# Patient Record
Sex: Female | Born: 1960 | Race: White | Hispanic: No | Marital: Married | Smoking: Never smoker
Health system: Southern US, Community
[De-identification: ages and names within clinical notes are randomized; demographics above are authoritative.]

## PROBLEM LIST (undated history)

## (undated) DIAGNOSIS — F32A Depression, unspecified: Secondary | ICD-10-CM

## (undated) DIAGNOSIS — D649 Anemia, unspecified: Secondary | ICD-10-CM

## (undated) DIAGNOSIS — K2970 Gastritis, unspecified, without bleeding: Secondary | ICD-10-CM

## (undated) DIAGNOSIS — F329 Major depressive disorder, single episode, unspecified: Secondary | ICD-10-CM

## (undated) DIAGNOSIS — N6019 Diffuse cystic mastopathy of unspecified breast: Secondary | ICD-10-CM

## (undated) DIAGNOSIS — F909 Attention-deficit hyperactivity disorder, unspecified type: Secondary | ICD-10-CM

## (undated) DIAGNOSIS — G4733 Obstructive sleep apnea (adult) (pediatric): Secondary | ICD-10-CM

## (undated) HISTORY — DX: Diffuse cystic mastopathy of unspecified breast: N60.19

## (undated) HISTORY — DX: Obstructive sleep apnea (adult) (pediatric): G47.33

## (undated) HISTORY — DX: Attention-deficit hyperactivity disorder, unspecified type: F90.9

## (undated) HISTORY — DX: Gastritis, unspecified, without bleeding: K29.70

## (undated) HISTORY — DX: Depression, unspecified: F32.A

## (undated) HISTORY — PX: GYNECOLOGIC CRYOSURGERY: SHX857

## (undated) HISTORY — DX: Major depressive disorder, single episode, unspecified: F32.9

## (undated) HISTORY — DX: Anemia, unspecified: D64.9

---

## 1992-03-10 HISTORY — PX: NASAL SEPTUM SURGERY: SHX37

## 1999-04-16 ENCOUNTER — Other Ambulatory Visit: Admission: RE | Admit: 1999-04-16 | Discharge: 1999-04-16 | Payer: Self-pay | Admitting: Family Medicine

## 1999-04-30 ENCOUNTER — Encounter: Payer: Self-pay | Admitting: Family Medicine

## 1999-04-30 ENCOUNTER — Ambulatory Visit (HOSPITAL_COMMUNITY): Admission: RE | Admit: 1999-04-30 | Discharge: 1999-04-30 | Payer: Self-pay | Admitting: Family Medicine

## 1999-05-03 ENCOUNTER — Ambulatory Visit (HOSPITAL_COMMUNITY): Admission: RE | Admit: 1999-05-03 | Discharge: 1999-05-03 | Payer: Self-pay | Admitting: *Deleted

## 1999-05-03 ENCOUNTER — Encounter: Payer: Self-pay | Admitting: Family Medicine

## 2000-10-13 ENCOUNTER — Ambulatory Visit (HOSPITAL_COMMUNITY): Admission: RE | Admit: 2000-10-13 | Discharge: 2000-10-13 | Payer: Self-pay | Admitting: Family Medicine

## 2000-10-13 ENCOUNTER — Encounter: Payer: Self-pay | Admitting: Family Medicine

## 2001-10-15 ENCOUNTER — Encounter: Payer: Self-pay | Admitting: Family Medicine

## 2001-10-15 ENCOUNTER — Ambulatory Visit (HOSPITAL_COMMUNITY): Admission: RE | Admit: 2001-10-15 | Discharge: 2001-10-15 | Payer: Self-pay | Admitting: Family Medicine

## 2002-10-25 ENCOUNTER — Ambulatory Visit (HOSPITAL_COMMUNITY): Admission: RE | Admit: 2002-10-25 | Discharge: 2002-10-25 | Payer: Self-pay | Admitting: Family Medicine

## 2002-10-25 ENCOUNTER — Encounter: Payer: Self-pay | Admitting: Family Medicine

## 2004-10-04 ENCOUNTER — Ambulatory Visit (HOSPITAL_COMMUNITY): Admission: RE | Admit: 2004-10-04 | Discharge: 2004-10-04 | Payer: Self-pay | Admitting: Family Medicine

## 2005-10-27 ENCOUNTER — Ambulatory Visit (HOSPITAL_COMMUNITY): Admission: RE | Admit: 2005-10-27 | Discharge: 2005-10-27 | Payer: Self-pay | Admitting: Family Medicine

## 2005-11-04 ENCOUNTER — Encounter: Admission: RE | Admit: 2005-11-04 | Discharge: 2005-11-04 | Payer: Self-pay | Admitting: Family Medicine

## 2006-11-05 ENCOUNTER — Ambulatory Visit (HOSPITAL_COMMUNITY): Admission: RE | Admit: 2006-11-05 | Discharge: 2006-11-05 | Payer: Self-pay | Admitting: Family Medicine

## 2007-11-08 ENCOUNTER — Ambulatory Visit (HOSPITAL_COMMUNITY): Admission: RE | Admit: 2007-11-08 | Discharge: 2007-11-08 | Payer: Self-pay | Admitting: Family Medicine

## 2008-04-26 ENCOUNTER — Encounter: Admission: RE | Admit: 2008-04-26 | Discharge: 2008-04-26 | Payer: Self-pay | Admitting: Internal Medicine

## 2008-11-10 ENCOUNTER — Encounter: Admission: RE | Admit: 2008-11-10 | Discharge: 2008-11-10 | Payer: Self-pay | Admitting: Internal Medicine

## 2008-11-14 ENCOUNTER — Ambulatory Visit (HOSPITAL_COMMUNITY): Admission: RE | Admit: 2008-11-14 | Discharge: 2008-11-14 | Payer: Self-pay | Admitting: Family Medicine

## 2008-11-16 ENCOUNTER — Ambulatory Visit: Payer: Self-pay | Admitting: Gynecology

## 2008-11-21 ENCOUNTER — Ambulatory Visit: Payer: Self-pay | Admitting: Internal Medicine

## 2008-11-23 LAB — CBC & DIFF AND RETIC
BASO%: 0.8 % (ref 0.0–2.0)
Basophils Absolute: 0.1 10*3/uL (ref 0.0–0.1)
EOS%: 1.5 % (ref 0.0–7.0)
Eosinophils Absolute: 0.1 10*3/uL (ref 0.0–0.5)
HCT: 32.3 % — ABNORMAL LOW (ref 34.8–46.6)
HGB: 9.7 g/dL — ABNORMAL LOW (ref 11.6–15.9)
Immature Retic Fract: 16.3 % — ABNORMAL HIGH (ref 0.00–10.70)
LYMPH%: 30.6 % (ref 14.0–49.7)
MCH: 22.4 pg — ABNORMAL LOW (ref 25.1–34.0)
MCHC: 30 g/dL — ABNORMAL LOW (ref 31.5–36.0)
MCV: 74.4 fL — ABNORMAL LOW (ref 79.5–101.0)
MONO#: 0.4 10*3/uL (ref 0.1–0.9)
MONO%: 6.4 % (ref 0.0–14.0)
NEUT#: 3.6 10*3/uL (ref 1.5–6.5)
NEUT%: 60.7 % (ref 38.4–76.8)
Platelets: 334 10*3/uL (ref 145–400)
RBC: 4.34 10*6/uL (ref 3.70–5.45)
RDW: 15.1 % — ABNORMAL HIGH (ref 11.2–14.5)
Retic %: 0.97 % (ref 0.50–1.50)
Retic Ct Abs: 42.1 10*3/uL (ref 18.30–72.70)
WBC: 5.9 10*3/uL (ref 3.9–10.3)
lymph#: 1.8 10*3/uL (ref 0.9–3.3)

## 2008-11-27 LAB — PROTEIN ELECTROPHORESIS, SERUM
Albumin ELP: 57 % (ref 55.8–66.1)
Alpha-1-Globulin: 3.9 % (ref 2.9–4.9)
Alpha-2-Globulin: 10.1 % (ref 7.1–11.8)
Beta 2: 6 % (ref 3.2–6.5)
Beta Globulin: 7.8 % — ABNORMAL HIGH (ref 4.7–7.2)
Gamma Globulin: 15.2 % (ref 11.1–18.8)
Total Protein, Serum Electrophoresis: 7.3 g/dL (ref 6.0–8.3)

## 2008-11-27 LAB — LACTATE DEHYDROGENASE: LDH: 145 U/L (ref 94–250)

## 2008-11-27 LAB — COMPREHENSIVE METABOLIC PANEL
ALT: 12 U/L (ref 0–35)
AST: 16 U/L (ref 0–37)
Albumin: 4.3 g/dL (ref 3.5–5.2)
Alkaline Phosphatase: 72 U/L (ref 39–117)
BUN: 8 mg/dL (ref 6–23)
CO2: 25 mEq/L (ref 19–32)
Calcium: 9.1 mg/dL (ref 8.4–10.5)
Chloride: 101 mEq/L (ref 96–112)
Creatinine, Ser: 0.65 mg/dL (ref 0.40–1.20)
Glucose, Bld: 90 mg/dL (ref 70–99)
Potassium: 4 mEq/L (ref 3.5–5.3)
Sodium: 137 mEq/L (ref 135–145)
Total Bilirubin: 0.3 mg/dL (ref 0.3–1.2)
Total Protein: 7.3 g/dL (ref 6.0–8.3)

## 2008-11-27 LAB — FERRITIN: Ferritin: 2 ng/mL — ABNORMAL LOW (ref 10–291)

## 2008-11-27 LAB — IRON AND TIBC
%SAT: 4 % — ABNORMAL LOW (ref 20–55)
Iron: 18 ug/dL — ABNORMAL LOW (ref 42–145)
TIBC: 403 ug/dL (ref 250–470)
UIBC: 385 ug/dL

## 2008-11-27 LAB — VITAMIN B12: Vitamin B-12: 725 pg/mL (ref 211–911)

## 2008-11-27 LAB — ERYTHROPOIETIN: Erythropoietin: 76.9 m[IU]/mL — ABNORMAL HIGH (ref 2.6–34.0)

## 2008-11-27 LAB — FOLATE: Folate: >20 ng/mL

## 2008-12-04 ENCOUNTER — Ambulatory Visit: Payer: Self-pay | Admitting: Gynecology

## 2008-12-07 ENCOUNTER — Ambulatory Visit: Payer: Self-pay | Admitting: Gynecology

## 2008-12-08 ENCOUNTER — Ambulatory Visit (HOSPITAL_BASED_OUTPATIENT_CLINIC_OR_DEPARTMENT_OTHER): Admission: RE | Admit: 2008-12-08 | Discharge: 2008-12-08 | Payer: Self-pay | Admitting: Gynecology

## 2008-12-08 ENCOUNTER — Encounter: Payer: Self-pay | Admitting: Gynecology

## 2008-12-09 HISTORY — PX: PELVIC LAPAROSCOPY: SHX162

## 2008-12-18 ENCOUNTER — Ambulatory Visit: Payer: Self-pay | Admitting: Gynecology

## 2008-12-29 ENCOUNTER — Ambulatory Visit: Payer: Self-pay | Admitting: Internal Medicine

## 2008-12-29 LAB — CBC WITH DIFFERENTIAL/PLATELET
BASO%: 0.7 % (ref 0.0–2.0)
Basophils Absolute: 0 10*3/uL (ref 0.0–0.1)
EOS%: 1.5 % (ref 0.0–7.0)
Eosinophils Absolute: 0.1 10*3/uL (ref 0.0–0.5)
HCT: 39.2 % (ref 34.8–46.6)
HGB: 12.2 g/dL (ref 11.6–15.9)
LYMPH%: 23.4 % (ref 14.0–49.7)
MCH: 25.6 pg (ref 25.1–34.0)
MCHC: 31.1 g/dL — ABNORMAL LOW (ref 31.5–36.0)
MCV: 82.2 fL (ref 79.5–101.0)
MONO#: 0.4 10*3/uL (ref 0.1–0.9)
MONO%: 6.1 % (ref 0.0–14.0)
NEUT#: 4.2 10*3/uL (ref 1.5–6.5)
NEUT%: 68.3 % (ref 38.4–76.8)
Platelets: 254 10*3/uL (ref 145–400)
RBC: 4.77 10*6/uL (ref 3.70–5.45)
RDW: 20.2 % — ABNORMAL HIGH (ref 11.2–14.5)
WBC: 6.1 10*3/uL (ref 3.9–10.3)
lymph#: 1.4 10*3/uL (ref 0.9–3.3)

## 2008-12-29 LAB — IRON AND TIBC
%SAT: 31 % (ref 20–55)
Iron: 82 ug/dL (ref 42–145)
TIBC: 266 ug/dL (ref 250–470)
UIBC: 184 ug/dL

## 2008-12-29 LAB — FERRITIN: Ferritin: 272 ng/mL (ref 10–291)

## 2009-03-28 ENCOUNTER — Ambulatory Visit (HOSPITAL_COMMUNITY): Admission: RE | Admit: 2009-03-28 | Discharge: 2009-03-28 | Payer: Self-pay | Admitting: Gastroenterology

## 2009-04-16 ENCOUNTER — Ambulatory Visit: Payer: Self-pay | Admitting: Internal Medicine

## 2009-04-18 LAB — CBC WITH DIFFERENTIAL/PLATELET
BASO%: 0.4 % (ref 0.0–2.0)
Basophils Absolute: 0 10*3/uL (ref 0.0–0.1)
EOS%: 1.9 % (ref 0.0–7.0)
Eosinophils Absolute: 0.2 10*3/uL (ref 0.0–0.5)
HCT: 38.4 % (ref 34.8–46.6)
HGB: 13 g/dL (ref 11.6–15.9)
LYMPH%: 23.3 % (ref 14.0–49.7)
MCH: 30.8 pg (ref 25.1–34.0)
MCHC: 33.8 g/dL (ref 31.5–36.0)
MCV: 91 fL (ref 79.5–101.0)
MONO#: 0.5 10*3/uL (ref 0.1–0.9)
MONO%: 5.5 % (ref 0.0–14.0)
NEUT#: 5.7 10*3/uL (ref 1.5–6.5)
NEUT%: 68.9 % (ref 38.4–76.8)
Platelets: 278 10*3/uL (ref 145–400)
RBC: 4.22 10*6/uL (ref 3.70–5.45)
RDW: 12.9 % (ref 11.2–14.5)
WBC: 8.3 10*3/uL (ref 3.9–10.3)
lymph#: 1.9 10*3/uL (ref 0.9–3.3)

## 2009-04-18 LAB — IRON AND TIBC
%SAT: 26 % (ref 20–55)
Iron: 68 ug/dL (ref 42–145)
TIBC: 258 ug/dL (ref 250–470)
UIBC: 190 ug/dL

## 2009-04-18 LAB — FERRITIN: Ferritin: 135 ng/mL (ref 10–291)

## 2010-02-14 ENCOUNTER — Ambulatory Visit: Payer: Self-pay | Admitting: Gynecology

## 2010-02-14 ENCOUNTER — Other Ambulatory Visit
Admission: RE | Admit: 2010-02-14 | Discharge: 2010-02-14 | Payer: Self-pay | Source: Home / Self Care | Admitting: Gynecology

## 2010-02-20 ENCOUNTER — Ambulatory Visit: Payer: Self-pay | Admitting: Gynecology

## 2010-03-08 ENCOUNTER — Ambulatory Visit: Payer: Self-pay | Admitting: Gynecology

## 2010-03-10 HISTORY — PX: BREAST BIOPSY: SHX20

## 2010-03-28 ENCOUNTER — Ambulatory Visit (HOSPITAL_COMMUNITY)
Admission: RE | Admit: 2010-03-28 | Discharge: 2010-03-28 | Payer: Self-pay | Source: Home / Self Care | Attending: Gynecology | Admitting: Gynecology

## 2010-03-30 ENCOUNTER — Encounter: Payer: Self-pay | Admitting: Family Medicine

## 2010-03-31 ENCOUNTER — Encounter: Payer: Self-pay | Admitting: Family Medicine

## 2010-04-04 ENCOUNTER — Encounter
Admission: RE | Admit: 2010-04-04 | Discharge: 2010-04-04 | Payer: Self-pay | Source: Home / Self Care | Attending: Gynecology | Admitting: Gynecology

## 2010-04-08 ENCOUNTER — Other Ambulatory Visit: Payer: Self-pay | Admitting: Diagnostic Radiology

## 2010-04-08 ENCOUNTER — Encounter
Admission: RE | Admit: 2010-04-08 | Discharge: 2010-04-08 | Payer: Self-pay | Source: Home / Self Care | Attending: Gynecology | Admitting: Gynecology

## 2011-05-19 ENCOUNTER — Other Ambulatory Visit: Payer: Self-pay | Admitting: Gynecology

## 2011-05-19 DIAGNOSIS — Z1231 Encounter for screening mammogram for malignant neoplasm of breast: Secondary | ICD-10-CM

## 2011-05-28 ENCOUNTER — Encounter: Payer: Self-pay | Admitting: Gynecology

## 2011-05-28 ENCOUNTER — Ambulatory Visit (INDEPENDENT_AMBULATORY_CARE_PROVIDER_SITE_OTHER): Payer: BC Managed Care – PPO | Admitting: Gynecology

## 2011-05-28 VITALS — BP 138/92 | Ht 61.5 in | Wt 155.0 lb

## 2011-05-28 DIAGNOSIS — I10 Essential (primary) hypertension: Secondary | ICD-10-CM | POA: Insufficient documentation

## 2011-05-28 DIAGNOSIS — N898 Other specified noninflammatory disorders of vagina: Secondary | ICD-10-CM

## 2011-05-28 DIAGNOSIS — N920 Excessive and frequent menstruation with regular cycle: Secondary | ICD-10-CM

## 2011-05-28 DIAGNOSIS — R635 Abnormal weight gain: Secondary | ICD-10-CM

## 2011-05-28 DIAGNOSIS — L293 Anogenital pruritus, unspecified: Secondary | ICD-10-CM

## 2011-05-28 DIAGNOSIS — G43909 Migraine, unspecified, not intractable, without status migrainosus: Secondary | ICD-10-CM

## 2011-05-28 DIAGNOSIS — Z01419 Encounter for gynecological examination (general) (routine) without abnormal findings: Secondary | ICD-10-CM

## 2011-05-28 DIAGNOSIS — R102 Pelvic and perineal pain: Secondary | ICD-10-CM

## 2011-05-28 LAB — CBC WITH DIFFERENTIAL/PLATELET
Basophils Absolute: 0 10*3/uL (ref 0.0–0.1)
Basophils Relative: 1 % (ref 0–1)
Eosinophils Absolute: 0.1 10*3/uL (ref 0.0–0.7)
Eosinophils Relative: 1 % (ref 0–5)
HCT: 44.5 % (ref 36.0–46.0)
Hemoglobin: 14.1 g/dL (ref 12.0–15.0)
Lymphocytes Relative: 25 % (ref 12–46)
Lymphs Abs: 1.9 10*3/uL (ref 0.7–4.0)
MCH: 28.8 pg (ref 26.0–34.0)
MCHC: 31.7 g/dL (ref 30.0–36.0)
MCV: 91 fL (ref 78.0–100.0)
Monocytes Absolute: 0.5 10*3/uL (ref 0.1–1.0)
Monocytes Relative: 7 % (ref 3–12)
Neutro Abs: 4.9 10*3/uL (ref 1.7–7.7)
Neutrophils Relative %: 66 % (ref 43–77)
Platelets: 277 10*3/uL (ref 150–400)
RBC: 4.89 MIL/uL (ref 3.87–5.11)
RDW: 14.2 % (ref 11.5–15.5)
WBC: 7.4 10*3/uL (ref 4.0–10.5)

## 2011-05-28 LAB — COMPREHENSIVE METABOLIC PANEL
ALT: 23 U/L (ref 0–35)
AST: 19 U/L (ref 0–37)
Albumin: 4.4 g/dL (ref 3.5–5.2)
Alkaline Phosphatase: 75 U/L (ref 39–117)
BUN: 15 mg/dL (ref 6–23)
CO2: 26 mEq/L (ref 19–32)
Calcium: 9.4 mg/dL (ref 8.4–10.5)
Chloride: 101 mEq/L (ref 96–112)
Creat: 0.65 mg/dL (ref 0.50–1.10)
Glucose, Bld: 87 mg/dL (ref 70–99)
Potassium: 4.1 mEq/L (ref 3.5–5.3)
Sodium: 137 mEq/L (ref 135–145)
Total Bilirubin: 0.7 mg/dL (ref 0.3–1.2)
Total Protein: 7.2 g/dL (ref 6.0–8.3)

## 2011-05-28 LAB — WET PREP FOR TRICH, YEAST, CLUE
Clue Cells Wet Prep HPF POC: NONE SEEN
Trich, Wet Prep: NONE SEEN
Yeast Wet Prep HPF POC: NONE SEEN

## 2011-05-28 LAB — CHOLESTEROL, TOTAL: Cholesterol: 196 mg/dL (ref 0–200)

## 2011-05-28 LAB — TSH: TSH: 2.415 u[IU]/mL (ref 0.350–4.500)

## 2011-05-28 MED ORDER — ESTRADIOL 0.0375 MG/24HR TD PTTW: MEDICATED_PATCH | TRANSDERMAL | Status: DC

## 2011-05-28 MED ORDER — TRANEXAMIC ACID 650 MG PO TABS: 1300.0000 mg | ORAL_TABLET | Freq: Three times a day (TID) | ORAL | Status: DC

## 2011-05-28 MED ORDER — TERCONAZOLE 0.4 % VA CREA: 1.0000 | TOPICAL_CREAM | Freq: Every day | VAGINAL | Status: AC

## 2011-05-28 NOTE — Progress Notes (Signed)
Kristin Whitney 1960-08-14 [id number]   History:    51 y.o.  for annual exam gravida 2 para 2 complaints of left lower quadrant discomfort when she is laying on that side. Also complaining of vulvar pruritus but no discharge. Her husband has had a vasectomy. Patient also has had episodes of menorrhagia and wanted something to help her down the amount of bleeding. Mammogram was in 2012 a left breast biopsy turned out to be fibrocystic changes. Patient suffers from migraines 2-3 days before her periods.  Past medical history,surgical history, family history and social history were all reviewed and documented in the EPIC chart.  Gynecologic History Patient's last menstrual period was 05/04/2011. Contraception: vasectomy Last Pap: 2011. Results were:normal} Last mammogram: 2012. Results were: Left breast fibrocystic changes on biopsy  Obstetric History OB History    Grav Para Term Preterm Abortions TAB SAB Ect Mult Living   2 2 1       2      # Outc Date GA Lbr Len/2nd Wgt Sex Del Anes PTL Lv   1 TRM     M SVD  No Yes   2 PAR     F SVD  No Yes       ROS:  Was performed and pertinent positives and negatives are included in the history.  Exam: chaperone present  BP 138/92  Ht 5' 1.5" (1.562 m)  Wt 155 lb (70.308 kg)  BMI 28.81 kg/m2  LMP 05/04/2011  Body mass index is 28.81 kg/(m^2).  General appearance : Well developed well nourished female. No acute distress HEENT: Neck supple, trachea midline, no carotid bruits, no thyroidmegaly Lungs: Clear to auscultation, no rhonchi or wheezes, or rib retractions  Heart: Regular rate and rhythm, no murmurs or gallops Breast:Examined in sitting and supine position were symmetrical in appearance, no palpable masses or tenderness,  no skin retraction, no nipple inversion, no nipple discharge, no skin discoloration, no axillary or supraclavicular lymphadenopathy Abdomen: no palpable masses or tenderness, no rebound or guarding Extremities: no  edema or skin discoloration or tenderness  Pelvic:  Bartholin, Urethra, Skene Glands: Within normal limits             Vagina: No gross lesions or discharge  Cervix: No gross lesions or discharge  Uterus  and retroverted, normal size, shape and consistency, non-tender and mobile  Adnexa  tender on left adnexa left ovary not able to be palpated.  Anus and perineum  normal   Rectovaginal  normal sphincter tone without palpated masses or tenderness             Hemoccult not done     Assessment/Plan:  51 y.o. female for annual exam will return back later in the week for an ultrasound to better assess her left adnexa due to the fact that she sometimes complains of tenderness especially when she lays on her left side. Wet prep was essentially negative this is mostly external we'll give her a trial of Terazol 7 to apply before bedtime for one week. The following labs will be drawn today CBC, cholesterol, competence metabolic panel and urinalysis. We'll recheck her blood pressure before she leaves today. Blood pressure was 130/92. Patient to schedule her mammogram as well. Her menstrual migraine we'll put her on Vivelle-Dot 0.0375 to apply 3-4 days before her menses. The risks benefits and pros and cons were discussed. She is instructed to do her monthly self breast examination. New Pap smear screening guidelines discussed last Pap in 2011 her  next Pap smear in 2014.  Ok Edwards MD, 10:43 AM 05/28/2011

## 2011-05-29 LAB — URINALYSIS W MICROSCOPIC + REFLEX CULTURE
Bacteria, UA: NONE SEEN
Bilirubin Urine: NEGATIVE
Casts: NONE SEEN
Crystals: NONE SEEN
Glucose, UA: NEGATIVE mg/dL
Hgb urine dipstick: NEGATIVE
Ketones, ur: NEGATIVE mg/dL
Leukocytes, UA: NEGATIVE
Nitrite: NEGATIVE
Protein, ur: NEGATIVE mg/dL
Specific Gravity, Urine: 1.02 (ref 1.005–1.030)
Squamous Epithelial / LPF: NONE SEEN
Urobilinogen, UA: 0.2 mg/dL (ref 0.0–1.0)
pH: 5 (ref 5.0–8.0)

## 2011-06-02 ENCOUNTER — Ambulatory Visit (INDEPENDENT_AMBULATORY_CARE_PROVIDER_SITE_OTHER): Payer: BC Managed Care – PPO | Admitting: Gynecology

## 2011-06-02 ENCOUNTER — Ambulatory Visit (INDEPENDENT_AMBULATORY_CARE_PROVIDER_SITE_OTHER): Payer: BC Managed Care – PPO

## 2011-06-02 ENCOUNTER — Encounter: Payer: Self-pay | Admitting: Gynecology

## 2011-06-02 VITALS — BP 128/80

## 2011-06-02 DIAGNOSIS — R102 Pelvic and perineal pain: Secondary | ICD-10-CM

## 2011-06-02 DIAGNOSIS — N949 Unspecified condition associated with female genital organs and menstrual cycle: Secondary | ICD-10-CM

## 2011-06-02 DIAGNOSIS — Z8742 Personal history of other diseases of the female genital tract: Secondary | ICD-10-CM

## 2011-06-02 DIAGNOSIS — D259 Leiomyoma of uterus, unspecified: Secondary | ICD-10-CM

## 2011-06-02 DIAGNOSIS — G43829 Menstrual migraine, not intractable, without status migrainosus: Secondary | ICD-10-CM | POA: Insufficient documentation

## 2011-06-02 DIAGNOSIS — N831 Corpus luteum cyst of ovary, unspecified side: Secondary | ICD-10-CM

## 2011-06-02 NOTE — Progress Notes (Signed)
Patient presented to the office today to discuss the results of the ultrasound that was ordered at the time of her annual exam at last office visit as a result of patient's left lower abdominal discomfort. Patient with prior history of left ovarian cystectomy and history of endometrial ablation. The discomfort is nonspecific and some time of this occurs only when she is laying on her side. It is not affecting her quality of life. She was also having menstrual migraines and recently had been started Vivelle-Dot 0.0375 which she applies transdermally to 4 days before her menses. She is to before the cycle and did not suffer from the menstrual migraines.  All her recent lab work was normal to include CBC, cholesterol, comprehensive metabolic panel and urinalysis. Her blood pressure today was 128/80.  Ultrasound: Uterus measured 9.9 x 5.5 x 4.8 cm with an endometrial stripe is 7 mm patient had 1 small intramural myoma measuring 11 x 8 mm both ovaries otherwise normal and no adnexal masses were seen.  Etiology for occasional left lower abdominal discomfort while lying down may be attributed to adhesions. Since is not affecting her quality of life we'll continue to monitor. She's going to work on exercising losing weight. If her symptoms worsen she'll contact the office and proceed with doing an abdominal pelvic CT scan is we had discussed in the office.

## 2011-06-10 ENCOUNTER — Encounter: Payer: Self-pay | Admitting: Gynecology

## 2011-06-12 ENCOUNTER — Ambulatory Visit (HOSPITAL_COMMUNITY): Payer: Self-pay

## 2011-06-18 ENCOUNTER — Ambulatory Visit (HOSPITAL_COMMUNITY): Payer: BC Managed Care – PPO

## 2011-06-24 ENCOUNTER — Ambulatory Visit (HOSPITAL_COMMUNITY)
Admission: RE | Admit: 2011-06-24 | Discharge: 2011-06-24 | Disposition: A | Discharge status: Home / Self Care | Payer: BC Managed Care – PPO | Source: Ambulatory Visit | Attending: Gynecology | Admitting: Gynecology

## 2011-06-24 DIAGNOSIS — Z1231 Encounter for screening mammogram for malignant neoplasm of breast: Secondary | ICD-10-CM

## 2012-05-12 ENCOUNTER — Other Ambulatory Visit: Payer: Self-pay | Admitting: Gynecology

## 2012-05-12 DIAGNOSIS — Z1231 Encounter for screening mammogram for malignant neoplasm of breast: Secondary | ICD-10-CM

## 2012-06-07 ENCOUNTER — Encounter: Payer: Self-pay | Admitting: Gynecology

## 2012-06-07 ENCOUNTER — Ambulatory Visit (INDEPENDENT_AMBULATORY_CARE_PROVIDER_SITE_OTHER): Payer: BC Managed Care – PPO | Admitting: Gynecology

## 2012-06-07 ENCOUNTER — Other Ambulatory Visit (HOSPITAL_COMMUNITY)
Admission: RE | Admit: 2012-06-07 | Discharge: 2012-06-07 | Disposition: A | Discharge status: Home / Self Care | Payer: BC Managed Care – PPO | Source: Ambulatory Visit | Attending: Gynecology | Admitting: Gynecology

## 2012-06-07 VITALS — BP 130/88 | Ht 61.5 in | Wt 162.0 lb

## 2012-06-07 DIAGNOSIS — Z01419 Encounter for gynecological examination (general) (routine) without abnormal findings: Secondary | ICD-10-CM

## 2012-06-07 DIAGNOSIS — Z23 Encounter for immunization: Secondary | ICD-10-CM

## 2012-06-07 DIAGNOSIS — Z1151 Encounter for screening for human papillomavirus (HPV): Secondary | ICD-10-CM | POA: Insufficient documentation

## 2012-06-07 DIAGNOSIS — N920 Excessive and frequent menstruation with regular cycle: Secondary | ICD-10-CM | POA: Insufficient documentation

## 2012-06-07 DIAGNOSIS — R635 Abnormal weight gain: Secondary | ICD-10-CM

## 2012-06-07 DIAGNOSIS — N930 Postcoital and contact bleeding: Secondary | ICD-10-CM

## 2012-06-07 DIAGNOSIS — N841 Polyp of cervix uteri: Secondary | ICD-10-CM

## 2012-06-07 DIAGNOSIS — Z862 Personal history of diseases of the blood and blood-forming organs and certain disorders involving the immune mechanism: Secondary | ICD-10-CM

## 2012-06-07 DIAGNOSIS — R5383 Other fatigue: Secondary | ICD-10-CM | POA: Insufficient documentation

## 2012-06-07 DIAGNOSIS — R5381 Other malaise: Secondary | ICD-10-CM

## 2012-06-07 LAB — CBC WITH DIFFERENTIAL/PLATELET
Basophils Absolute: 0.1 10*3/uL (ref 0.0–0.1)
Basophils Relative: 1 % (ref 0–1)
Eosinophils Absolute: 0.1 10*3/uL (ref 0.0–0.7)
Eosinophils Relative: 1 % (ref 0–5)
HCT: 41.1 % (ref 36.0–46.0)
Hemoglobin: 13.5 g/dL (ref 12.0–15.0)
Lymphocytes Relative: 25 % (ref 12–46)
Lymphs Abs: 1.6 10*3/uL (ref 0.7–4.0)
MCH: 29.3 pg (ref 26.0–34.0)
MCHC: 32.8 g/dL (ref 30.0–36.0)
MCV: 89.3 fL (ref 78.0–100.0)
Monocytes Absolute: 0.3 10*3/uL (ref 0.1–1.0)
Monocytes Relative: 5 % (ref 3–12)
Neutro Abs: 4.3 10*3/uL (ref 1.7–7.7)
Neutrophils Relative %: 68 % (ref 43–77)
Platelets: 295 10*3/uL (ref 150–400)
RBC: 4.6 MIL/uL (ref 3.87–5.11)
RDW: 13.5 % (ref 11.5–15.5)
WBC: 6.4 10*3/uL (ref 4.0–10.5)

## 2012-06-07 LAB — TSH: TSH: 2.834 u[IU]/mL (ref 0.350–4.500)

## 2012-06-07 LAB — CHOLESTEROL, TOTAL: Cholesterol: 197 mg/dL (ref 0–200)

## 2012-06-07 LAB — HEMOGLOBIN A1C
Hgb A1c MFr Bld: 5.5 % (ref <5.7)
Mean Plasma Glucose: 111 mg/dL (ref <117)

## 2012-06-07 NOTE — Progress Notes (Signed)
Kristin Whitney 05-17-1960 [id number]   History:    52 y.o.  for annual gyn exam whose main complaint today is tiredness and fatigue. Patient also has past history of anemia whereby she had been referred to the hematologist oncologist Dr. Shirline Frees back in 2010 and patient received iron dextran infusion. She had subsequently been started on iron tablet but did not continue to do so. Review of her record also indicated that she has a prior history of endometrial ablation back in 2010 as well as resectoscopic polypectomy at the same time. During that operation she also had a left ovarian cystectomy for a benign paratubal cyst that was causing her pain and she had tubo-ovarian adhesions. Patient states her cycles are lasting approximately 7 days and are heavy. Patient also at times has had some postcoital bleeding.Patient does her monthly self breast examination her last mammogram was in 2013. Review of her record indicated that she had a left breast biopsy 2012 and only fibrocystic changes were noted. Patient also has informed me that greater than 10 years ago she had cryotherapy of her cervix but subsequent Pap smears have been normal. Her last normal Pap smear her office was in 2011. Patient's last colonoscopy was negative back in 2011. Her husband has had a vasectomy. Patient continued to gaining weight. Last year she was weighing 155 and is up to 162 now.  Past medical history,surgical history, family history and social history were all reviewed and documented in the EPIC chart.  Gynecologic History Patient's last menstrual period was 05/19/2012. Contraception: vasectomy Last Pap: 2011. Results were: normal Last mammogram: 2013. Results were: normal  Obstetric History OB History   Grav Para Term Preterm Abortions TAB SAB Ect Mult Living   2 2 1       2      # Outc Date GA Lbr Len/2nd Wgt Sex Del Anes PTL Lv   1 TRM     M SVD  No Yes   2 PAR     F SVD  No Yes       ROS: A ROS was performed and  pertinent positives and negatives are included in the history.  GENERAL: No fevers or chills. HEENT: No change in vision, no earache, sore throat or sinus congestion. NECK: No pain or stiffness. CARDIOVASCULAR: No chest pain or pressure. No palpitations. PULMONARY: No shortness of breath, cough or wheeze. GASTROINTESTINAL: No abdominal pain, nausea, vomiting or diarrhea, melena or bright red blood per rectum. GENITOURINARY: No urinary frequency, urgency, hesitancy or dysuria. MUSCULOSKELETAL: No joint or muscle pain, no back pain, no recent trauma. DERMATOLOGIC: No rash, no itching, no lesions. ENDOCRINE: No polyuria, polydipsia, no heat or cold intolerance. No recent change in weight. HEMATOLOGICAL: No anemia or easy bruising or bleeding. NEUROLOGIC: No headache, seizures, numbness, tingling or weakness. PSYCHIATRIC: No depression, no loss of interest in normal activity or change in sleep pattern.     Exam: chaperone present  BP 130/88  Ht 5' 1.5" (1.562 m)  Wt 162 lb (73.483 kg)  BMI 30.12 kg/m2  LMP 05/19/2012  Body mass index is 30.12 kg/(m^2).  General appearance : Well developed well nourished female. No acute distress HEENT: Neck supple, trachea midline, no carotid bruits, no thyroidmegaly Lungs: Clear to auscultation, no rhonchi or wheezes, or rib retractions  Heart: Regular rate and rhythm, no murmurs or gallops Breast:Examined in sitting and supine position were symmetrical in appearance, no palpable masses or tenderness,  no skin retraction, no nipple inversion, no  nipple discharge, no skin discoloration, no axillary or supraclavicular lymphadenopathy Abdomen: no palpable masses or tenderness, no rebound or guarding Extremities: no edema or skin discoloration or tenderness  Pelvic:  Bartholin, Urethra, Skene Glands: Within normal limits             Vagina: No gross lesions or discharge  Cervix: No gross lesions or discharge, cervical polyp  Uterus  anteverted, normal size, shape  and consistency, non-tender and mobile  Adnexa  Without masses or tenderness  Anus and perineum  normal   Rectovaginal  normal sphincter tone without palpated masses or tenderness             Hemoccult antevertednot indicated     Assessment/Plan:  52 y.o. female for annual exam who today incidentally was found to have a cervical polyp. The area was cleansed with Betadine solution. The cervical polyp was grasped with a ring forcep and twisted off its pedicle and submitted for histological evaluation. Silver nitrate was used for hemostasis. This was done after her Pap smear was done. The new guidelines were discussed. Patient will return back next week for sonohysterogram to see if there is any room in the intrauterine cavity that will allow to place a Mirena IUD to help with her menorrhagia and her anemia. If not we had discussed alternatives to include low dose oral contraceptive pill continuous whereby she would withdraw every 3 months or proceed with a transvaginal hysterectomy with ovarian conservation. Literature information was provided on the above. The following labs were ordered today: CBC, hemoglobin A1c, screening cholesterol, TSH, urinalysis, and vitamin D level.. Patient scheduled for mammogram next month. She is encouraged her monthly self breast examination. We discussed importance of regular exercise and healthy nutrition.    Ok Edwards MD, 9:17 AM 06/07/2012

## 2012-06-07 NOTE — Patient Instructions (Addendum)
Tetanus, Diphtheria, Pertussis (Tdap) Vaccine What You Need to Know WHY GET VACCINATED? Tetanus, diphtheria and pertussis can be very serious diseases, even for adolescents and adults. Tdap vaccine can protect us from these diseases. TETANUS (Lockjaw) causes painful muscle tightening and stiffness, usually all over the body.  It can lead to tightening of muscles in the head and neck so you can't open your mouth, swallow, or sometimes even breathe. Tetanus kills about 1 out of 5 people who are infected. DIPHTHERIA can cause a thick coating to form in the back of the throat.  It can lead to breathing problems, paralysis, heart failure, and death. PERTUSSIS (Whooping Cough) causes severe coughing spells, which can cause difficulty breathing, vomiting and disturbed sleep.  It can also lead to weight loss, incontinence, and rib fractures. Up to 2 in 100 adolescents and 5 in 100 adults with pertussis are hospitalized or have complications, which could include pneumonia and death. These diseases are caused by bacteria. Diphtheria and pertussis are spread from person to person through coughing or sneezing. Tetanus enters the body through cuts, scratches, or wounds. Before vaccines, the [place] saw as many as 200,000 cases a year of diphtheria and pertussis, and hundreds of cases of tetanus. Since vaccination began, tetanus and diphtheria have dropped by about 99% and pertussis by about 80%. TDAP VACCINE Tdap vaccine can protect adolescents and adults from tetanus, diphtheria, and pertussis. One dose of Tdap is routinely given at age 11 or 12. People who did not get Tdap at that age should get it as soon as possible. Tdap is especially important for health care professionals and anyone having close contact with a baby younger than 12 months. Pregnant women should get a dose of Tdap during every pregnancy, to protect the newborn from pertussis. Infants are most at risk for severe, life-threatening  complications from pertussis. A similar vaccine, called Td, protects from tetanus and diphtheria, but not pertussis. A Td booster should be given every 10 years. Tdap may be given as one of these boosters if you have not already gotten a dose. Tdap may also be given after a severe cut or burn to prevent tetanus infection. Your doctor can give you more information. Tdap may safely be given at the same time as other vaccines. SOME PEOPLE SHOULD NOT GET THIS VACCINE  If you ever had a life-threatening allergic reaction after a dose of any tetanus, diphtheria, or pertussis containing vaccine, OR if you have a severe allergy to any part of this vaccine, you should not get Tdap. Tell your doctor if you have any severe allergies.  If you had a coma, or long or multiple seizures within 7 days after a childhood dose of DTP or DTaP, you should not get Tdap, unless a cause other than the vaccine was found. You can still get Td.  Talk to your doctor if you:  have epilepsy or another nervous system problem,  had severe pain or swelling after any vaccine containing diphtheria, tetanus or pertussis,  ever had Guillain-Barr Syndrome (GBS),  aren't feeling well on the day the shot is scheduled. RISKS OF A VACCINE REACTION With any medicine, including vaccines, there is a chance of side effects. These are usually mild and go away on their own, but serious reactions are also possible. Brief fainting spells can follow a vaccination, leading to injuries from falling. Sitting or lying down for about 15 minutes can help prevent these. Tell your doctor if you feel dizzy or light-headed, or   have vision changes or ringing in the ears. Mild problems following Tdap (Did not interfere with activities)  Pain where the shot was given (about 3 in 4 adolescents or 2 in 3 adults)  Redness or swelling where the shot was given (about 1 person in 5)  Mild fever of at least 100.26F (up to about 1 in 25 adolescents or 1 in  100 adults)  Headache (about 3 or 4 people in 10)  Tiredness (about 1 person in 3 or 4)  Nausea, vomiting, diarrhea, stomach ache (up to 1 in 4 adolescents or 1 in 10 adults)  Chills, body aches, sore joints, rash, swollen glands (uncommon) Moderate problems following Tdap (Interfered with activities, but did not require medical attention)  Pain where the shot was given (about 1 in 5 adolescents or 1 in 100 adults)  Redness or swelling where the shot was given (up to about 1 in 16 adolescents or 1 in 25 adults)  Fever over 102F (about 1 in 100 adolescents or 1 in 250 adults)  Headache (about 3 in 20 adolescents or 1 in 10 adults)  Nausea, vomiting, diarrhea, stomach ache (up to 1 or 3 people in 100)  Swelling of the entire arm where the shot was given (up to about 3 in 100). Severe problems following Tdap (Unable to perform usual activities, required medical attention)  Swelling, severe pain, bleeding and redness in the arm where the shot was given (rare). A severe allergic reaction could occur after any vaccine (estimated less than 1 in a million doses). WHAT IF THERE IS A SERIOUS REACTION? What should I look for?  Look for anything that concerns you, such as signs of a severe allergic reaction, very high fever, or behavior changes. Signs of a severe allergic reaction can include hives, swelling of the face and throat, difficulty breathing, a fast heartbeat, dizziness, and weakness. These would start a few minutes to a few hours after the vaccination. What should I do?  If you think it is a severe allergic reaction or other emergency that can't wait, call 9-1-1 or get the person to the nearest hospital. Otherwise, call your doctor.  Afterward, the reaction should be reported to the "Vaccine Adverse Event Reporting System" (VAERS). Your doctor might file this report, or you can do it yourself through the VAERS web site at www.vaers.LAgents.no, or by calling 1-8702139958. VAERS is  only for reporting reactions. They do not give medical advice.  THE NATIONAL VACCINE INJURY COMPENSATION PROGRAM The National Vaccine Injury Compensation Program (VICP) is a federal program that was created to compensate people who may have been injured by certain vaccines. Persons who believe they may have been injured by a vaccine can learn about the program and about filing a claim by calling 1-226-570-3923 or visiting the VICP website at SpiritualWord.at. HOW CAN I LEARN MORE?  Ask your doctor.  Call your local or state health department.  Contact the Centers for Disease Control and Prevention (CDC):  Call 818 398 2113 or visit CDC's website at PicCapture.uy CDC Tdap Vaccine VIS (07/17/11) Document Released: 08/26/2011 Document Reviewed: 08/26/2011 [facility] Patient Information 2013 [place], [place].  Hysterectomy Information  A hysterectomy is a procedure where your uterus is surgically removed. It will no longer be possible to have menstrual periods or to become pregnant. The tubes and ovaries can be removed (bilateral salpingo-oopherectomy) during this surgery as well.  REASONS FOR A HYSTERECTOMY  Persistent, abnormal bleeding.  Lasting (chronic) pelvic pain or infection.  The lining of the  uterus (endometrium) starts growing outside the uterus (endometriosis).  The endometrium starts growing in the muscle of the uterus (adenomyosis).  The uterus falls down into the vagina (pelvic organ prolapse).  Symptomatic uterine fibroids.  Precancerous cells.  Cervical cancer or uterine cancer. TYPES OF HYSTERECTOMIES  Supracervical hysterectomy. This type removes the top part of the uterus, but not the cervix.  Total hysterectomy. This type removes the uterus and cervix.  Radical hysterectomy. This type removes the uterus, cervix, and the fibrous tissue that holds the uterus in place in the pelvis (parametrium). WAYS A HYSTERECTOMY CAN BE  PERFORMED  Abdominal hysterectomy. A large surgical cut (incision) is made in the abdomen. The uterus is removed through this incision.  Vaginal hysterectomy. An incision is made in the vagina. The uterus is removed through this incision. There are no abdominal incisions.  Conventional laparoscopic hysterectomy. A thin, lighted tube with a camera (laparoscope) is inserted into 3 or 4 small incisions in the abdomen. The uterus is cut into small pieces. The small pieces are removed through the incisions, or they are removed through the vagina.  Laparoscopic assisted vaginal hysterectomy (LAVH). Three or four small incisions are made in the abdomen. Part of the surgery is performed laparoscopically and part vaginally. The uterus is removed through the vagina.  Robot-assisted laparoscopic hysterectomy. A laparoscope is inserted into 3 or 4 small incisions in the abdomen. A computer-controlled device is used to give the surgeon a 3D image. This allows for more precise movements of surgical instruments. The uterus is cut into small pieces and removed through the incisions or removed through the vagina. RISKS OF HYSTERECTOMY   Bleeding and risk of blood transfusion. Tell your caregiver if you do not want to receive any blood products.  Blood clots in the legs or lung.  Infection.  Injury to surrounding organs.  Anesthesia problems or side effects.  Conversion to an abdominal hysterectomy. WHAT TO EXPECT AFTER A HYSTERECTOMY  You will be given pain medicine.  You will need to have someone with you for the first 3 to 5 days after you go home.  You will need to follow up with your surgeon in 2 to 4 weeks after surgery to evaluate your progress.  You may have early menopause symptoms like hot flashes, night sweats, and insomnia.  If you had a hysterectomy for a problem that was not a cancer or a condition that could lead to cancer, then you no longer need Pap tests. However, even if you no  longer need a Pap test, a regular exam is a good idea to make sure no other problems are starting. Document Released: 08/20/2000 Document Revised: 05/19/2011 Document Reviewed: 10/05/2010 [facility] Patient Information 2013 [place], [place].

## 2012-06-08 LAB — URINALYSIS W MICROSCOPIC + REFLEX CULTURE
Bacteria, UA: NONE SEEN
Bilirubin Urine: NEGATIVE
Casts: NONE SEEN
Crystals: NONE SEEN
Glucose, UA: NEGATIVE mg/dL
Hgb urine dipstick: NEGATIVE
Ketones, ur: NEGATIVE mg/dL
Leukocytes, UA: NEGATIVE
Nitrite: NEGATIVE
Protein, ur: NEGATIVE mg/dL
Specific Gravity, Urine: 1.016 (ref 1.005–1.030)
Squamous Epithelial / LPF: NONE SEEN
Urobilinogen, UA: 0.2 mg/dL (ref 0.0–1.0)
pH: 5 (ref 5.0–8.0)

## 2012-06-08 LAB — VITAMIN D 25 HYDROXY (VIT D DEFICIENCY, FRACTURES): Vit D, 25-Hydroxy: 34 ng/mL (ref 30–89)

## 2012-06-09 ENCOUNTER — Telehealth: Payer: Self-pay | Admitting: *Deleted

## 2012-06-09 NOTE — Telephone Encounter (Signed)
Her bleeding may have come from the cervical polyp that we removed. If she likes we can wait for by 3 months and she can monitor her cycles but if she continues to have irregular bleeding we can do a sonohysterogram. We also discussed placing her on a continuous oral contraceptive pill whereby she would withdraw every 3 months .her CBC was normal so we can monitor for now.

## 2012-06-09 NOTE — Telephone Encounter (Signed)
Pt was scheduled for [facility] on 06/16/12 but canceled because she found out she has not yet met her deductible with insurance. She asked if you believe this is something that could be postponed at the time? Or should she have this done now? She was thinking if her lab work looked okay and now signs of anemia then okay to wait. Please advise

## 2012-06-09 NOTE — Telephone Encounter (Signed)
Pt informed with the below note. 

## 2012-06-16 ENCOUNTER — Ambulatory Visit: Payer: BC Managed Care – PPO | Admitting: Gynecology

## 2012-06-16 ENCOUNTER — Other Ambulatory Visit: Payer: BC Managed Care – PPO

## 2012-06-24 ENCOUNTER — Ambulatory Visit (HOSPITAL_COMMUNITY): Payer: BC Managed Care – PPO

## 2012-06-30 ENCOUNTER — Ambulatory Visit (HOSPITAL_COMMUNITY)
Admission: RE | Admit: 2012-06-30 | Discharge: 2012-06-30 | Disposition: A | Discharge status: Home / Self Care | Payer: BC Managed Care – PPO | Source: Ambulatory Visit | Attending: Gynecology | Admitting: Gynecology

## 2012-06-30 DIAGNOSIS — Z1231 Encounter for screening mammogram for malignant neoplasm of breast: Secondary | ICD-10-CM

## 2012-07-27 ENCOUNTER — Other Ambulatory Visit: Payer: Self-pay | Admitting: Physician Assistant

## 2012-07-27 DIAGNOSIS — D229 Melanocytic nevi, unspecified: Secondary | ICD-10-CM

## 2012-07-27 HISTORY — DX: Melanocytic nevi, unspecified: D22.9

## 2013-01-13 ENCOUNTER — Other Ambulatory Visit: Payer: Self-pay

## 2013-06-15 ENCOUNTER — Ambulatory Visit
Admission: RE | Admit: 2013-06-15 | Discharge: 2013-06-15 | Disposition: A | Discharge status: Home / Self Care | Payer: BC Managed Care – PPO | Source: Ambulatory Visit | Attending: Nurse Practitioner | Admitting: Nurse Practitioner

## 2013-06-15 ENCOUNTER — Other Ambulatory Visit: Payer: Self-pay | Admitting: Nurse Practitioner

## 2013-06-15 DIAGNOSIS — M542 Cervicalgia: Secondary | ICD-10-CM

## 2013-06-20 ENCOUNTER — Other Ambulatory Visit: Payer: Self-pay | Admitting: Nurse Practitioner

## 2013-06-20 DIAGNOSIS — M542 Cervicalgia: Secondary | ICD-10-CM

## 2013-06-24 ENCOUNTER — Other Ambulatory Visit: Payer: BC Managed Care – PPO

## 2013-06-28 ENCOUNTER — Ambulatory Visit
Admission: RE | Admit: 2013-06-28 | Discharge: 2013-06-28 | Disposition: A | Discharge status: Home / Self Care | Payer: BC Managed Care – PPO | Source: Ambulatory Visit | Attending: Nurse Practitioner | Admitting: Nurse Practitioner

## 2013-06-28 DIAGNOSIS — M542 Cervicalgia: Secondary | ICD-10-CM

## 2013-07-22 ENCOUNTER — Other Ambulatory Visit: Payer: Self-pay | Admitting: Neurological Surgery

## 2013-07-22 DIAGNOSIS — M47812 Spondylosis without myelopathy or radiculopathy, cervical region: Secondary | ICD-10-CM

## 2013-07-25 ENCOUNTER — Ambulatory Visit
Admission: RE | Admit: 2013-07-25 | Discharge: 2013-07-25 | Disposition: A | Discharge status: Home / Self Care | Payer: BC Managed Care – PPO | Source: Ambulatory Visit | Attending: Neurological Surgery | Admitting: Neurological Surgery

## 2013-07-25 DIAGNOSIS — M47812 Spondylosis without myelopathy or radiculopathy, cervical region: Secondary | ICD-10-CM

## 2013-07-25 MED ORDER — TRIAMCINOLONE ACETONIDE 40 MG/ML IJ SUSP (RADIOLOGY)
60.0000 mg | Freq: Once | INTRAMUSCULAR | Status: AC
  Administered 2013-07-25: 60 mg via EPIDURAL

## 2013-07-25 MED ORDER — IOHEXOL 300 MG/ML  SOLN
1.0000 mL | Freq: Once | INTRAMUSCULAR | Status: AC | PRN
  Administered 2013-07-25: 1 mL via EPIDURAL

## 2013-07-25 NOTE — Discharge Instructions (Signed)

## 2013-09-15 ENCOUNTER — Other Ambulatory Visit: Payer: Self-pay | Admitting: Gynecology

## 2013-09-15 DIAGNOSIS — Z1231 Encounter for screening mammogram for malignant neoplasm of breast: Secondary | ICD-10-CM

## 2013-09-20 ENCOUNTER — Ambulatory Visit (HOSPITAL_COMMUNITY)
Admission: RE | Admit: 2013-09-20 | Discharge: 2013-09-20 | Disposition: A | Discharge status: Home / Self Care | Payer: BC Managed Care – PPO | Source: Ambulatory Visit | Attending: Gynecology | Admitting: Gynecology

## 2013-09-20 DIAGNOSIS — Z1231 Encounter for screening mammogram for malignant neoplasm of breast: Secondary | ICD-10-CM | POA: Insufficient documentation

## 2013-09-21 ENCOUNTER — Other Ambulatory Visit: Payer: Self-pay | Admitting: Gynecology

## 2013-09-21 DIAGNOSIS — R928 Other abnormal and inconclusive findings on diagnostic imaging of breast: Secondary | ICD-10-CM

## 2013-09-28 ENCOUNTER — Ambulatory Visit
Admission: RE | Admit: 2013-09-28 | Discharge: 2013-09-28 | Disposition: A | Discharge status: Home / Self Care | Payer: BC Managed Care – PPO | Source: Ambulatory Visit | Attending: Gynecology | Admitting: Gynecology

## 2013-09-28 DIAGNOSIS — R928 Other abnormal and inconclusive findings on diagnostic imaging of breast: Secondary | ICD-10-CM

## 2013-10-07 ENCOUNTER — Ambulatory Visit (INDEPENDENT_AMBULATORY_CARE_PROVIDER_SITE_OTHER): Payer: BC Managed Care – PPO | Admitting: Gynecology

## 2013-10-07 ENCOUNTER — Encounter: Payer: Self-pay | Admitting: Gynecology

## 2013-10-07 VITALS — BP 124/76 | Ht 62.0 in | Wt 167.8 lb

## 2013-10-07 DIAGNOSIS — Z01419 Encounter for gynecological examination (general) (routine) without abnormal findings: Secondary | ICD-10-CM

## 2013-10-07 DIAGNOSIS — R635 Abnormal weight gain: Secondary | ICD-10-CM

## 2013-10-07 DIAGNOSIS — N951 Menopausal and female climacteric states: Secondary | ICD-10-CM

## 2013-10-07 DIAGNOSIS — N926 Irregular menstruation, unspecified: Secondary | ICD-10-CM

## 2013-10-07 DIAGNOSIS — R29898 Other symptoms and signs involving the musculoskeletal system: Secondary | ICD-10-CM

## 2013-10-07 DIAGNOSIS — M6289 Other specified disorders of muscle: Secondary | ICD-10-CM

## 2013-10-07 NOTE — Progress Notes (Signed)
Kristin Whitney 02-04-1961 [id number]   History:    53 y.o.  for annual gyn exam with complaint of skipping cycles. She stated that the month of June and July she did not have a menses and she recently started a few days ago. She's had some nausea at times. Her husband has had a vasectomy. Patient with no previous history of abnormal Pap smear.Patient also has past history of anemia whereby she had been referred to the hematologist oncologist Dr. Earlie Server back in 2010 and patient received iron dextran infusion. She had subsequently been started on iron tablet but did not continue to do so. Review of her record also indicated that she has a prior history of endometrial ablation back in 2010 as well as resectoscopic polypectomy at the same time. During that operation she also had a left ovarian cystectomy for a benign paratubal cyst that was causing her pain and she had tubo-ovarian adhesions. Patient also has informed me that greater than 10 years ago she had cryotherapy of her cervix but subsequent Pap smears have been normal. Her last normal Pap smear her office was in 2011. Patient's last colonoscopy was negative back in 2011.     Past medical history,surgical history, family history and social history were all reviewed and documented in the EPIC chart.  Gynecologic History Patient's last menstrual period was 09/03/2013. Contraception: vasectomy Last Pap: 2014. Results were: normal Last mammogram: 2015. Results were: normal  Obstetric History OB History  Gravida Para Term Preterm AB SAB TAB Ectopic Multiple Living  2 2 1       2     # Outcome Date GA Lbr Len/2nd Weight Sex Delivery Anes PTL Lv  2 PAR     F SVD  N Y  1 TRM     M SVD  N Y       ROS: A ROS was performed and pertinent positives and negatives are included in the history.  GENERAL: No fevers or chills. HEENT: No change in vision, no earache, sore throat or sinus congestion. NECK: No pain or stiffness. CARDIOVASCULAR: No  chest pain or pressure. No palpitations. PULMONARY: No shortness of breath, cough or wheeze. GASTROINTESTINAL: No abdominal pain, nausea, vomiting or diarrhea, melena or bright red blood per rectum. GENITOURINARY: No urinary frequency, urgency, hesitancy or dysuria. MUSCULOSKELETAL: No joint or muscle pain, no back pain, no recent trauma. DERMATOLOGIC: No rash, no itching, no lesions. ENDOCRINE: No polyuria, polydipsia, no heat or cold intolerance. No recent change in weight. HEMATOLOGICAL: No anemia or easy bruising or bleeding. NEUROLOGIC: No headache, seizures, numbness, tingling or weakness. PSYCHIATRIC: No depression, no loss of interest in normal activity or change in sleep pattern.     Exam: chaperone present  BP 124/76  Ht 5\' 2"  (1.575 m)  Wt 167 lb 12.8 oz (76.114 kg)  BMI 30.68 kg/m2  LMP 09/03/2013  Body mass index is 30.68 kg/(m^2).  General appearance : Well developed well nourished female. No acute distress HEENT: Neck supple, trachea midline, no carotid bruits, no thyroidmegaly Lungs: Clear to auscultation, no rhonchi or wheezes, or rib retractions  Heart: Regular rate and rhythm, no murmurs or gallops Breast:Examined in sitting and supine position were symmetrical in appearance, no palpable masses or tenderness,  no skin retraction, no nipple inversion, no nipple discharge, no skin discoloration, no axillary or supraclavicular lymphadenopathy Abdomen: no palpable masses or tenderness, no rebound or guarding Extremities: no edema or skin discoloration or tenderness  Pelvic:  Bartholin, Urethra,  Skene Glands: Within normal limits             Vagina: No gross lesions or discharge, menstrual blood  Cervix: No gross lesions or discharge  Uterus  anteverted, normal size, shape and consistency, non-tender and mobile  Adnexa  Without masses or tenderness  Anus and perineum  normal   Rectovaginal  normal sphincter tone without palpated masses or tenderness             Hemoccult  not indicated     Assessment/Plan:  53 y.o. female for annual exam who appears to be perimenopausal with irritability and skipping cycles. She will return back next week for a fasting blood work which will include a fasting lipid profile, conference and metabolic panel, TSH, CBC, prolactin, and urinalysis. We're also going to get a baseline FSH. I provided her with literature information on the perimenopause as well as on hormone replacement therapy. We'll continue to monitor her cycles and symptoms of it recurs patient will contact the office and we may need to start her on Provera 10 mg each month if she does not have a spontaneous menses every 35 days providing that all the above lab tests are normal. She was reminded to followup with her gastroenterologist as she is 52 in each her colonoscopy. Pap smear was not done today. Because of patient's tiredness and fatigue were going to check also a vitamin D level.  Note: This dictation was prepared with  Dragon/digital dictation along withSmart phrase technology. Any transcriptional errors that result from this process are unintentional.   Terrance Mass MD, 11:18 AM 10/07/2013

## 2013-10-07 NOTE — Patient Instructions (Signed)
Hormone Therapy At menopause, your body begins making less estrogen and progesterone hormones. This causes the body to stop having menstrual periods. This is because estrogen and progesterone hormones control your periods and menstrual cycle. A lack of estrogen may cause symptoms such as:  Hot flushes (or hot flashes).  Vaginal dryness.  Dry skin.  Loss of sex drive.  Risk of bone loss (osteoporosis). When this happens, you may choose to take hormone therapy to get back the estrogen lost during menopause. When the hormone estrogen is given alone, it is usually referred to as ET (Estrogen Therapy). When the hormone progestin is combined with estrogen, it is generally called HT (Hormone Therapy). This was formerly known as hormone replacement therapy (HRT). Your caregiver can help you make a decision on what will be best for you. The decision to use HT seems to change often as new studies are done. Many studies do not agree on the benefits of hormone replacement therapy. LIKELY BENEFITS OF HT INCLUDE PROTECTION FROM:  Hot Flushes (also called hot flashes) - A hot flush is a sudden feeling of heat that spreads over the face and body. The skin may redden like a blush. It is connected with sweats and sleep disturbance. Women going through menopause may have hot flushes a few times a month or several times per day depending on the woman.  Osteoporosis (bone loss)- Estrogen helps guard against bone loss. After menopause, a woman's bones slowly lose calcium and become weak and brittle. As a result, bones are more likely to break. The hip, wrist, and spine are affected most often. Hormone therapy can help slow bone loss after menopause. Weight bearing exercise and taking calcium with vitamin D also can help prevent bone loss. There are also medications that your caregiver can prescribe that can help prevent osteoporosis.  Vaginal Dryness - Loss of estrogen causes changes in the vagina. Its lining may  become thin and dry. These changes can cause pain and bleeding during sexual intercourse. Dryness can also lead to infections. This can cause burning and itching. (Vaginal estrogen treatment can help relieve pain, itching, and dryness.)  Urinary Tract Infections are more common after menopause because of lack of estrogen. Some women also develop urinary incontinence because of low estrogen levels in the vagina and bladder.  Possible other benefits of estrogen include a positive effect on mood and short-term memory in women. RISKS AND COMPLICATIONS  Using estrogen alone without progesterone causes the lining of the uterus to grow. This increases the risk of lining of the uterus (endometrial) cancer. Your caregiver should give another hormone called progestin if you have a uterus.  Women who take combined (estrogen and progestin) HT appear to have an increased risk of breast cancer. The risk appears to be small, but increases throughout the time that HT is taken.  Combined therapy also makes the breast tissue slightly denser which makes it harder to read mammograms (breast X-rays).  Combined, estrogen and progesterone therapy can be taken together every day, in which case there may be spotting of blood. HT therapy can be taken cyclically in which case you will have menstrual periods. Cyclically means HT is taken for a set amount of days, then not taken, then this process is repeated.  HT may increase the risk of stroke, heart attack, breast cancer and forming blood clots in your leg.  Transdermal estrogen (estrogen that is absorbed through the skin with a patch or a cream) may have more positive results with:    Cholesterol.  Blood pressure.  Blood clots. Having the following conditions may indicate you should not have HT:  Endometrial cancer.  Liver disease.  Breast cancer.  Heart disease.  History of blood clots.  Stroke. TREATMENT   If you choose to take HT and have a uterus,  usually estrogen and progestin are prescribed.  Your caregiver will help you decide the best way to take the medications.  Possible ways to take estrogen include:  Pills.  Patches.  Gels.  Sprays.  Vaginal estrogen cream, rings and tablets.  It is best to take the lowest dose possible that will help your symptoms and take them for the shortest period of time that you can.  Hormone therapy can help relieve some of the problems (symptoms) that affect women at menopause. Before making a decision about HT, talk to your caregiver about what is best for you. Be well informed and comfortable with your decisions. HOME CARE INSTRUCTIONS   Follow your caregivers advice when taking the medications.  A Pap test is done to screen for cervical cancer.  The first Pap test should be done at age 21.  Between ages 21 and 29, Pap tests are repeated every 2 years.  Beginning at age 30, you are advised to have a Pap test every 3 years as long as your past 3 Pap tests have been normal.  Some women have medical problems that increase the chance of getting cervical cancer. Talk to your caregiver about these problems. It is especially important to talk to your caregiver if a new problem develops soon after your last Pap test. In these cases, your caregiver may recommend more frequent screening and Pap tests.  The above recommendations are the same for women who have or have not gotten the vaccine for HPV (Human Papillomavirus).  If you had a hysterectomy for a problem that was not a cancer or a condition that could lead to cancer, then you no longer need Pap tests. However, even if you no longer need a Pap test, a regular exam is a good idea to make sure no other problems are starting.   If you are between ages 65 and 70, and you have had normal Pap tests going back 10 years, you no longer need Pap tests. However, even if you no longer need a Pap test, a regular exam is a good idea to make sure no  other problems are starting.   If you have had past treatment for cervical cancer or a condition that could lead to cancer, you need Pap tests and screening for cancer for at least 20 years after your treatment.  If Pap tests have been discontinued, risk factors (such as a new sexual partner) need to be re-assessed to determine if screening should be resumed.  Some women may need screenings more often if they are at high risk for cervical cancer.  Get mammograms done as per the advice of your caregiver. SEEK IMMEDIATE MEDICAL CARE IF:  You develop abnormal vaginal bleeding.  You have pain or swelling in your legs, shortness of breath, or chest pain.  You develop dizziness or headaches.  You have lumps or changes in your breasts or armpits.  You have slurred speech.  You develop weakness or numbness of your arms or legs.  You have pain, burning, or bleeding when urinating.  You develop abdominal pain. Document Released: 11/23/2002 Document Revised: 05/19/2011 Document Reviewed: 03/13/2010 ExitCare Patient Information 2015 [employer]. This information is not intended to   replace advice given to you by your health care provider. Make sure you discuss any questions you have with your health care provider. Perimenopause Perimenopause is the time when your body begins to move into the menopause (no menstrual period for 12 straight months). It is a natural process. Perimenopause can begin 2-8 years before the menopause and usually lasts for 1 year after the menopause. During this time, your ovaries may or may not produce an egg. The ovaries vary in their production of estrogen and progesterone hormones each month. This can cause irregular menstrual periods, difficulty getting pregnant, vaginal bleeding between periods, and uncomfortable symptoms. CAUSES  Irregular production of the ovarian hormones, estrogen and progesterone, and not ovulating every month.  Other causes  include:  Tumor of the pituitary gland in the brain.  Medical disease that affects the ovaries.  Radiation treatment.  Chemotherapy.  Unknown causes.  Heavy smoking and excessive alcohol intake can bring on perimenopause sooner. SIGNS AND SYMPTOMS   Hot flashes.  Night sweats.  Irregular menstrual periods.  Decreased sex drive.  Vaginal dryness.  Headaches.  Mood swings.  Depression.  Memory problems.  Irritability.  Tiredness.  Weight gain.  Trouble getting pregnant.  The beginning of losing bone cells (osteoporosis).  The beginning of hardening of the arteries (atherosclerosis). DIAGNOSIS  Your health care provider will make a diagnosis by analyzing your age, menstrual history, and symptoms. He or she will do a physical exam and note any changes in your body, especially your female organs. Female hormone tests may or may not be helpful depending on the amount of female hormones you produce and when you produce them. However, other hormone tests may be helpful to rule out other problems. TREATMENT  In some cases, no treatment is needed. The decision on whether treatment is necessary during the perimenopause should be made by you and your health care provider based on how the symptoms are affecting you and your lifestyle. Various treatments are available, such as:  Treating individual symptoms with a specific medicine for that symptom.  Herbal medicines that can help specific symptoms.  Counseling.  Group therapy. HOME CARE INSTRUCTIONS   Keep track of your menstrual periods (when they occur, how heavy they are, how long between periods, and how long they last) as well as your symptoms and when they started.  Only take over-the-counter or prescription medicines as directed by your health care provider.  Sleep and rest.  Exercise.  Eat a diet that contains calcium (good for your bones) and soy (acts like the estrogen hormone).  Do not smoke.  Avoid  alcoholic beverages.  Take vitamin supplements as recommended by your health care provider. Taking vitamin E may help in certain cases.  Take calcium and vitamin D supplements to help prevent bone loss.  Group therapy is sometimes helpful.  Acupuncture may help in some cases. SEEK MEDICAL CARE IF:   You have questions about any symptoms you are having.  You need a referral to a specialist (gynecologist, psychiatrist, or psychologist). SEEK IMMEDIATE MEDICAL CARE IF:   You have vaginal bleeding.  Your period lasts longer than 8 days.  Your periods are recurring sooner than 21 days.  You have bleeding after intercourse.  You have severe depression.  You have pain when you urinate.  You have severe headaches.  You have vision problems. Document Released: 04/03/2004 Document Revised: 12/15/2012 Document Reviewed: 09/23/2012 ExitCare Patient Information 2015 [employer]. This information is not intended to replace advice given to   to you by your health care provider. Make sure you discuss any questions you have with your health care provider.  

## 2013-10-10 ENCOUNTER — Other Ambulatory Visit: Payer: BC Managed Care – PPO

## 2013-10-10 DIAGNOSIS — N951 Menopausal and female climacteric states: Secondary | ICD-10-CM

## 2013-10-10 DIAGNOSIS — M6289 Other specified disorders of muscle: Secondary | ICD-10-CM

## 2013-10-10 DIAGNOSIS — Z01419 Encounter for gynecological examination (general) (routine) without abnormal findings: Secondary | ICD-10-CM

## 2013-10-10 DIAGNOSIS — N926 Irregular menstruation, unspecified: Secondary | ICD-10-CM

## 2013-10-10 LAB — COMPREHENSIVE METABOLIC PANEL
ALT: 14 U/L (ref 0–35)
AST: 14 U/L (ref 0–37)
Albumin: 4 g/dL (ref 3.5–5.2)
Alkaline Phosphatase: 73 U/L (ref 39–117)
BUN: 9 mg/dL (ref 6–23)
CO2: 26 mEq/L (ref 19–32)
Calcium: 9 mg/dL (ref 8.4–10.5)
Chloride: 101 mEq/L (ref 96–112)
Creat: 0.65 mg/dL (ref 0.50–1.10)
Glucose, Bld: 82 mg/dL (ref 70–99)
Potassium: 4.1 mEq/L (ref 3.5–5.3)
Sodium: 137 mEq/L (ref 135–145)
Total Bilirubin: 0.4 mg/dL (ref 0.2–1.2)
Total Protein: 6.7 g/dL (ref 6.0–8.3)

## 2013-10-10 LAB — CBC WITH DIFFERENTIAL/PLATELET
Basophils Absolute: 0.1 10*3/uL (ref 0.0–0.1)
Basophils Relative: 1 % (ref 0–1)
Eosinophils Absolute: 0.1 10*3/uL (ref 0.0–0.7)
Eosinophils Relative: 1 % (ref 0–5)
HCT: 39.4 % (ref 36.0–46.0)
Hemoglobin: 13 g/dL (ref 12.0–15.0)
Lymphocytes Relative: 23 % (ref 12–46)
Lymphs Abs: 1.6 10*3/uL (ref 0.7–4.0)
MCH: 28.4 pg (ref 26.0–34.0)
MCHC: 33 g/dL (ref 30.0–36.0)
MCV: 86.2 fL (ref 78.0–100.0)
Monocytes Absolute: 0.4 10*3/uL (ref 0.1–1.0)
Monocytes Relative: 6 % (ref 3–12)
Neutro Abs: 4.8 10*3/uL (ref 1.7–7.7)
Neutrophils Relative %: 69 % (ref 43–77)
Platelets: 311 10*3/uL (ref 150–400)
RBC: 4.57 MIL/uL (ref 3.87–5.11)
RDW: 14 % (ref 11.5–15.5)
WBC: 6.9 10*3/uL (ref 4.0–10.5)

## 2013-10-10 LAB — TSH: TSH: 1.523 u[IU]/mL (ref 0.350–4.500)

## 2013-10-10 LAB — LIPID PANEL
Cholesterol: 202 mg/dL — ABNORMAL HIGH (ref 0–200)
HDL: 72 mg/dL (ref >39)
LDL Cholesterol: 105 mg/dL — ABNORMAL HIGH (ref 0–99)
Total CHOL/HDL Ratio: 2.8 Ratio
Triglycerides: 123 mg/dL (ref <150)
VLDL: 25 mg/dL (ref 0–40)

## 2013-10-10 LAB — PROLACTIN: Prolactin: 5.3 ng/mL

## 2013-10-10 LAB — FOLLICLE STIMULATING HORMONE: FSH: 11.2 m[IU]/mL

## 2013-10-11 LAB — URINALYSIS W MICROSCOPIC + REFLEX CULTURE
Bilirubin Urine: NEGATIVE
Casts: NONE SEEN
Crystals: NONE SEEN
Glucose, UA: NEGATIVE mg/dL
Ketones, ur: NEGATIVE mg/dL
Leukocytes, UA: NEGATIVE
Nitrite: NEGATIVE
Protein, ur: NEGATIVE mg/dL
Specific Gravity, Urine: 1.016 (ref 1.005–1.030)
Urobilinogen, UA: 0.2 mg/dL (ref 0.0–1.0)
pH: 5.5 (ref 5.0–8.0)

## 2013-10-11 LAB — VITAMIN D 25 HYDROXY (VIT D DEFICIENCY, FRACTURES): Vit D, 25-Hydroxy: 43 ng/mL (ref 30–89)

## 2013-10-20 ENCOUNTER — Telehealth: Payer: Self-pay | Admitting: *Deleted

## 2013-10-20 NOTE — Telephone Encounter (Signed)
Pt informed with all labs results on 10/10/13

## 2014-01-09 ENCOUNTER — Encounter: Payer: Self-pay | Admitting: Gynecology

## 2014-01-18 ENCOUNTER — Other Ambulatory Visit: Payer: Self-pay | Admitting: Neurological Surgery

## 2014-01-18 DIAGNOSIS — M542 Cervicalgia: Secondary | ICD-10-CM

## 2014-01-26 ENCOUNTER — Ambulatory Visit
Admission: RE | Admit: 2014-01-26 | Discharge: 2014-01-26 | Disposition: A | Discharge status: Home / Self Care | Payer: BC Managed Care – PPO | Source: Ambulatory Visit | Attending: Neurological Surgery | Admitting: Neurological Surgery

## 2014-01-26 DIAGNOSIS — M542 Cervicalgia: Secondary | ICD-10-CM

## 2014-01-26 MED ORDER — IOHEXOL 300 MG/ML  SOLN
1.0000 mL | Freq: Once | INTRAMUSCULAR | Status: AC | PRN
  Administered 2014-01-26: 1 mL via EPIDURAL

## 2014-01-26 MED ORDER — TRIAMCINOLONE ACETONIDE 40 MG/ML IJ SUSP (RADIOLOGY)
60.0000 mg | Freq: Once | INTRAMUSCULAR | Status: AC
  Administered 2014-01-26: 60 mg via EPIDURAL

## 2014-01-26 NOTE — Discharge Instructions (Signed)

## 2014-03-15 ENCOUNTER — Ambulatory Visit
Admission: RE | Admit: 2014-03-15 | Discharge: 2014-03-15 | Disposition: A | Discharge status: Home / Self Care | Payer: BC Managed Care – PPO | Source: Ambulatory Visit | Attending: Nurse Practitioner | Admitting: Nurse Practitioner

## 2014-03-15 ENCOUNTER — Other Ambulatory Visit: Payer: Self-pay | Admitting: Nurse Practitioner

## 2014-03-15 DIAGNOSIS — R51 Headache: Principal | ICD-10-CM

## 2014-03-15 DIAGNOSIS — R519 Headache, unspecified: Secondary | ICD-10-CM

## 2014-03-16 ENCOUNTER — Other Ambulatory Visit: Payer: Self-pay | Admitting: Nurse Practitioner

## 2014-03-16 DIAGNOSIS — G4489 Other headache syndrome: Secondary | ICD-10-CM

## 2014-03-22 ENCOUNTER — Ambulatory Visit
Admission: RE | Admit: 2014-03-22 | Discharge: 2014-03-22 | Disposition: A | Discharge status: Home / Self Care | Payer: BC Managed Care – PPO | Source: Ambulatory Visit | Attending: Nurse Practitioner | Admitting: Nurse Practitioner

## 2014-03-22 DIAGNOSIS — G4489 Other headache syndrome: Secondary | ICD-10-CM

## 2014-03-22 MED ORDER — GADOBENATE DIMEGLUMINE 529 MG/ML IV SOLN
15.0000 mL | Freq: Once | INTRAVENOUS | Status: AC | PRN
  Administered 2014-03-22: 15 mL via INTRAVENOUS

## 2014-09-26 ENCOUNTER — Other Ambulatory Visit: Payer: Self-pay | Admitting: Gynecology

## 2014-09-26 DIAGNOSIS — Z1231 Encounter for screening mammogram for malignant neoplasm of breast: Secondary | ICD-10-CM

## 2014-10-02 ENCOUNTER — Ambulatory Visit (HOSPITAL_COMMUNITY)
Admission: RE | Admit: 2014-10-02 | Discharge: 2014-10-02 | Disposition: A | Discharge status: Home / Self Care | Payer: BC Managed Care – PPO | Source: Ambulatory Visit | Attending: Gynecology | Admitting: Gynecology

## 2014-10-02 DIAGNOSIS — Z1231 Encounter for screening mammogram for malignant neoplasm of breast: Secondary | ICD-10-CM | POA: Insufficient documentation

## 2014-10-27 ENCOUNTER — Encounter: Payer: BC Managed Care – PPO | Admitting: Gynecology

## 2014-12-05 ENCOUNTER — Ambulatory Visit (INDEPENDENT_AMBULATORY_CARE_PROVIDER_SITE_OTHER): Payer: BC Managed Care – PPO | Admitting: Gynecology

## 2014-12-05 ENCOUNTER — Encounter: Payer: Self-pay | Admitting: Gynecology

## 2014-12-05 VITALS — BP 138/86 | Ht 62.0 in | Wt 171.6 lb

## 2014-12-05 DIAGNOSIS — Z1159 Encounter for screening for other viral diseases: Secondary | ICD-10-CM | POA: Diagnosis not present

## 2014-12-05 DIAGNOSIS — Z01419 Encounter for gynecological examination (general) (routine) without abnormal findings: Secondary | ICD-10-CM | POA: Diagnosis not present

## 2014-12-05 DIAGNOSIS — Z8639 Personal history of other endocrine, nutritional and metabolic disease: Secondary | ICD-10-CM | POA: Diagnosis not present

## 2014-12-05 DIAGNOSIS — N951 Menopausal and female climacteric states: Secondary | ICD-10-CM

## 2014-12-05 DIAGNOSIS — D251 Intramural leiomyoma of uterus: Secondary | ICD-10-CM

## 2014-12-05 NOTE — Progress Notes (Signed)
Kristin Whitney 1960/11/24 [id number]   History:    54 y.o.  for annual gyn exam who is currently perimenopausal. Last year she skipped 2 months of menstrual cycles and again this year in February and March. She denies any vasomotor symptoms. She stated that her primary care provider early this year had treated her for vitamin D deficiency but she is currently not taking any calcium or vitamin D.Patient also has past history of anemia whereby she had been referred to the hematologist oncologist Dr. Earlie Server back in 2010 and patient received iron dextran infusion. She had subsequently been started on iron tablet but did not continue to do so. Review of her record also indicated that she has a prior history of endometrial ablation back in 2010 as well as resectoscopic polypectomy at the same time. During that operation she also had a left ovarian cystectomy for a benign paratubal cyst that was causing her pain and she had tubo-ovarian adhesions. Patient also has informed me that greater than 10 years ago she had cryotherapy of her cervix but subsequent Pap smears have been normal. Her last normal Pap smear her office was in 2015. She had a normal colonoscopy in 2011 as part of the workup.   Past medical history,surgical history, family history and social history were all reviewed and documented in the EPIC chart.  Gynecologic History Patient's last menstrual period was 11/21/2014. Contraception: vasectomy Last Pap: 2014. Results were: normal Last mammogram: 2016. Results were: normal  Obstetric History OB History  Gravida Para Term Preterm AB SAB TAB Ectopic Multiple Living  2 2 1       2     # Outcome Date GA Lbr Len/2nd Weight Sex Delivery Anes PTL Lv  2 Para     F Vag-Spont  N Y  1 Term     M Vag-Spont  N Y       ROS: A ROS was performed and pertinent positives and negatives are included in the history.  GENERAL: No fevers or chills. HEENT: No change in vision, no earache, sore throat  or sinus congestion. NECK: No pain or stiffness. CARDIOVASCULAR: No chest pain or pressure. No palpitations. PULMONARY: No shortness of breath, cough or wheeze. GASTROINTESTINAL: No abdominal pain, nausea, vomiting or diarrhea, melena or bright red blood per rectum. GENITOURINARY: No urinary frequency, urgency, hesitancy or dysuria. MUSCULOSKELETAL: No joint or muscle pain, no back pain, no recent trauma. DERMATOLOGIC: No rash, no itching, no lesions. ENDOCRINE: No polyuria, polydipsia, no heat or cold intolerance. No recent change in weight. HEMATOLOGICAL: No anemia or easy bruising or bleeding. NEUROLOGIC: No headache, seizures, numbness, tingling or weakness. PSYCHIATRIC: No depression, no loss of interest in normal activity or change in sleep pattern.     Exam: chaperone present  BP 138/86 mmHg  Ht 5\' 2"  (1.575 m)  Wt 171 lb 9.6 oz (77.837 kg)  BMI 31.38 kg/m2  LMP 11/21/2014  Body mass index is 31.38 kg/(m^2).  General appearance : Well developed well nourished female. No acute distress HEENT: Eyes: no retinal hemorrhage or exudates,  Neck supple, trachea midline, no carotid bruits, no thyroidmegaly Lungs: Clear to auscultation, no rhonchi or wheezes, or rib retractions  Heart: Regular rate and rhythm, no murmurs or gallops Breast:Examined in sitting and supine position were symmetrical in appearance, no palpable masses or tenderness,  no skin retraction, no nipple inversion, no nipple discharge, no skin discoloration, no axillary or supraclavicular lymphadenopathy Abdomen: no palpable masses or tenderness, no rebound  or guarding Extremities: no edema or skin discoloration or tenderness  Pelvic:  Bartholin, Urethra, Skene Glands: Within normal limits             Vagina: No gross lesions or discharge  Cervix: No gross lesions or discharge  Uterus  10 week size irregular,   Adnexa  Without masses or tenderness  Anus and perineum  normal   Rectovaginal  normal sphincter tone without  palpated masses or tenderness             Hemoccult will provide cards when she comes in for blood work     Assessment/Plan:  55 y.o. female for annual exam who will return to the office later this week in a fasting state for the following screening blood work: Comprehensive metabolic panel, fasting lipid profile, TSH, CBC, and urinalysis. We'll also order an ultrasound for follow-up on her fibroid uterus since it appeared to be slightly larger than a few years ago. Pap smear not indicated this year. We discussed importance of monthly breast exams. We discussed importance of calcium and vitamin D for osteoporosis prevention. Because of her history of vitamin D deficiency will 1 to check her vitamin D level today. Also as a result of her perimenopausal irregular menstrual pattern where going to check her [facility] today to compare with 2015 which was 11.2. Patient is going to have her flu vaccine next week at her employment.   Terrance Mass MD, 4:07 PM 12/05/2014

## 2014-12-06 ENCOUNTER — Ambulatory Visit (INDEPENDENT_AMBULATORY_CARE_PROVIDER_SITE_OTHER): Payer: BC Managed Care – PPO

## 2014-12-06 ENCOUNTER — Ambulatory Visit (INDEPENDENT_AMBULATORY_CARE_PROVIDER_SITE_OTHER): Payer: BC Managed Care – PPO | Admitting: Gynecology

## 2014-12-06 ENCOUNTER — Encounter: Payer: Self-pay | Admitting: Gynecology

## 2014-12-06 VITALS — BP 128/86

## 2014-12-06 DIAGNOSIS — N951 Menopausal and female climacteric states: Secondary | ICD-10-CM

## 2014-12-06 DIAGNOSIS — Z8639 Personal history of other endocrine, nutritional and metabolic disease: Secondary | ICD-10-CM

## 2014-12-06 DIAGNOSIS — D251 Intramural leiomyoma of uterus: Secondary | ICD-10-CM

## 2014-12-06 DIAGNOSIS — Z1159 Encounter for screening for other viral diseases: Secondary | ICD-10-CM | POA: Diagnosis not present

## 2014-12-06 DIAGNOSIS — Z01419 Encounter for gynecological examination (general) (routine) without abnormal findings: Secondary | ICD-10-CM

## 2014-12-06 LAB — COMPREHENSIVE METABOLIC PANEL
ALT: 19 U/L (ref 6–29)
AST: 19 U/L (ref 10–35)
Albumin: 4.2 g/dL (ref 3.6–5.1)
Alkaline Phosphatase: 68 U/L (ref 33–130)
BUN: 13 mg/dL (ref 7–25)
CO2: 30 mmol/L (ref 20–31)
Calcium: 9.8 mg/dL (ref 8.6–10.4)
Chloride: 99 mmol/L (ref 98–110)
Creat: 0.6 mg/dL (ref 0.50–1.05)
Glucose, Bld: 84 mg/dL (ref 65–99)
Potassium: 4.4 mmol/L (ref 3.5–5.3)
Sodium: 137 mmol/L (ref 135–146)
Total Bilirubin: 0.4 mg/dL (ref 0.2–1.2)
Total Protein: 7.1 g/dL (ref 6.1–8.1)

## 2014-12-06 LAB — CBC WITH DIFFERENTIAL/PLATELET
Basophils Absolute: 0.1 10*3/uL (ref 0.0–0.1)
Basophils Relative: 1 % (ref 0–1)
Eosinophils Absolute: 0.1 10*3/uL (ref 0.0–0.7)
Eosinophils Relative: 2 % (ref 0–5)
HCT: 41.7 % (ref 36.0–46.0)
Hemoglobin: 13.6 g/dL (ref 12.0–15.0)
Lymphocytes Relative: 29 % (ref 12–46)
Lymphs Abs: 2.1 10*3/uL (ref 0.7–4.0)
MCH: 28.8 pg (ref 26.0–34.0)
MCHC: 32.6 g/dL (ref 30.0–36.0)
MCV: 88.2 fL (ref 78.0–100.0)
MPV: 11.6 fL (ref 8.6–12.4)
Monocytes Absolute: 0.4 10*3/uL (ref 0.1–1.0)
Monocytes Relative: 6 % (ref 3–12)
Neutro Abs: 4.4 10*3/uL (ref 1.7–7.7)
Neutrophils Relative %: 62 % (ref 43–77)
Platelets: 289 10*3/uL (ref 150–400)
RBC: 4.73 MIL/uL (ref 3.87–5.11)
RDW: 13.3 % (ref 11.5–15.5)
WBC: 7.1 10*3/uL (ref 4.0–10.5)

## 2014-12-06 LAB — TSH: TSH: 1.834 u[IU]/mL (ref 0.350–4.500)

## 2014-12-06 LAB — LIPID PANEL
Cholesterol: 198 mg/dL (ref 125–200)
HDL: 55 mg/dL (ref >=46)
LDL Cholesterol: 101 mg/dL (ref <130)
Total CHOL/HDL Ratio: 3.6 Ratio (ref <=5.0)
Triglycerides: 210 mg/dL — ABNORMAL HIGH (ref <150)
VLDL: 42 mg/dL — ABNORMAL HIGH (ref <30)

## 2014-12-06 NOTE — Progress Notes (Signed)
   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 Earlie Server back in 2010 and patient received iron dextran infusion. She had subsequently been started on iron tablet but did not continue to do so. Review of her record also indicated that she has a prior history of endometrial ablation back in 2010 as well as resectoscopic polypectomy at the same time. During that operation she also had a left ovarian cystectomy for a benign paratubal cyst that was causing her pain and she had tubo-ovarian adhesions.  Ultrasound report: Uterus measured 9.7 x 5.9 x 4.87 m with endometrial stripe of 6.7 mm. A small intramural fibroid measuring 9 x 11 mm essentially unchanged from previous scan 2013. Right and left ovary were normal. Small follicles were noted less than 11 mm. No fluid in the cul-de-sac in no apparent adnexal masses.  Assessment/plan: Patient perimenopausal was stable fibroid uterus. Patient is here in a fasting state to get her screening blood work to include the following: Comprehensive metabolic panel, fasting lipid profile, TSH, CBC, and urinalysis. Because of her past history vitamin D deficiency vitamin D level be checked today. As a result of her perimenopausal symptoms and FSH will be drawn today as well.

## 2014-12-07 ENCOUNTER — Telehealth: Payer: Self-pay

## 2014-12-07 ENCOUNTER — Other Ambulatory Visit: Payer: Self-pay | Admitting: Gynecology

## 2014-12-07 DIAGNOSIS — E781 Pure hyperglyceridemia: Secondary | ICD-10-CM

## 2014-12-07 DIAGNOSIS — E782 Mixed hyperlipidemia: Secondary | ICD-10-CM

## 2014-12-07 LAB — URINALYSIS W MICROSCOPIC + REFLEX CULTURE
Bilirubin Urine: NEGATIVE
Casts: NONE SEEN [LPF]
Crystals: NONE SEEN [HPF]
Glucose, UA: NEGATIVE
Hgb urine dipstick: NEGATIVE
Ketones, ur: NEGATIVE
Nitrite: NEGATIVE
Protein, ur: NEGATIVE
RBC / HPF: NONE SEEN RBC/HPF (ref <=2)
Specific Gravity, Urine: 1.026 (ref 1.001–1.035)
Yeast: NONE SEEN [HPF]
pH: 5.5 (ref 5.0–8.0)

## 2014-12-07 LAB — VITAMIN D 25 HYDROXY (VIT D DEFICIENCY, FRACTURES): Vit D, 25-Hydroxy: 31 ng/mL (ref 30–100)

## 2014-12-07 LAB — HEPATITIS C ANTIBODY: HCV Ab: NEGATIVE

## 2014-12-07 LAB — FOLLICLE STIMULATING HORMONE: FSH: 17.4 m[IU]/mL

## 2014-12-07 NOTE — Telephone Encounter (Signed)
-----   Message from Terrance Mass, MD sent at 12/07/2014  8:01 AM EDT ----- Please inform patient that her lipid profile indicated that her triglycerides were elevated above normal. Normal is considered less than 150 she was up to 210 and her VLDL was 42 and normal less than 30. Please send her a cholesterol lowering diet handout and encourage exercise 1 hour 3-4 times a week. I would like to repeat a fasting lipid profile in 6 months.

## 2014-12-07 NOTE — Telephone Encounter (Signed)
Patient asked about Vit D level result.  It was 31.  She said back in Feb Dr. Toy Cookey, her PCP, checked it and it was low. She had her take two round of prescription Vit D.  She started 2000 units of Vit D3 in June after finishing Rx.  She said she had failed to take it for the last week or so just forgetting about it.  Did you want to recommend anything other than stay on the D3 2000 units?

## 2014-12-07 NOTE — Telephone Encounter (Signed)
She is fine with a value of 31. Have her continue to take vitamin D3 2000 units every day that is all she needs

## 2014-12-07 NOTE — Telephone Encounter (Signed)
Patient informed. 

## 2014-12-08 LAB — URINE CULTURE: Colony Count: 40000

## 2015-08-02 ENCOUNTER — Encounter: Payer: Self-pay | Admitting: Physician Assistant

## 2015-08-02 ENCOUNTER — Ambulatory Visit (INDEPENDENT_AMBULATORY_CARE_PROVIDER_SITE_OTHER): Payer: BC Managed Care – PPO | Admitting: Physician Assistant

## 2015-08-02 VITALS — BP 134/80 | HR 64 | Temp 98.2°F | Resp 18 | Ht 60.75 in | Wt 171.0 lb

## 2015-08-02 DIAGNOSIS — S0031XA Abrasion of nose, initial encounter: Secondary | ICD-10-CM

## 2015-08-02 MED ORDER — MUPIROCIN CALCIUM 2 % NA OINT: 1.0000 "application " | TOPICAL_OINTMENT | Freq: Two times a day (BID) | NASAL | Status: DC

## 2015-08-02 NOTE — Progress Notes (Signed)
    Patient ID: AMIYLA PLOTT MRN: [id number], DOB: 05/24/1960, 55 y.o. Date of Encounter: 08/02/2015, 10:50 AM    Chief Complaint:  Chief Complaint  Patient presents with  . new pt est care    has had sore in nostril x sev mths  still there     HPI: 55 y.o. year old white female presents with above.   She has been accepted by Dr. Dennard Schaumann as a new patient.  However, this problem has been annoying her so she needed to be seen today with me.   She will return to "Establish Care" with Dr. Dennard Schaumann.   She says that back during the winter months "when everybody's sick with colds" she noticed this irritated spot in her nose.  It it in right Nares, Superior aspect, just within inside of nares.  Says she has applied neosporin.  Says at times it will feel smooth and she will think it has healed up, but then will develop small dot of blood or small dot of crust/scab again.   No other complaint/concern today.      Home Meds:   Outpatient Prescriptions Prior to Visit  Medication Sig Dispense Refill  . ALPRAZolam (XANAX) 0.5 MG tablet Take 0.5 mg by mouth at bedtime as needed. Reported on 08/02/2015    . fexofenadine (ALLEGRA) 180 MG tablet Take 180 mg by mouth daily.    . meloxicam (MOBIC) 15 MG tablet Take 15 mg by mouth daily.     No facility-administered medications prior to visit.    Allergies:  Allergies  Allergen Reactions  . Clarithromycin       Review of Systems: See HPI for pertinent ROS. All other ROS negative.    Physical Exam: Blood pressure 134/80, pulse 64, temperature 98.2 F (36.8 C), temperature source Oral, resp. rate 18, height 5' 0.75" (1.543 m), weight 171 lb (77.565 kg)., Body mass index is 32.58 kg/(m^2). General: WNWD WF.  Appears in no acute distress. HEENT:  Nose: Right Nares-- Superior aspect, just within inside of nares. There is tiny ~39mm area of scab. No mass/papule. No abscess. No other abnormal findings.  Neck: Supple. No thyromegaly. No  lymphadenopathy. Lungs: Clear bilaterally to auscultation without wheezes, rales, or rhonchi. Breathing is unlabored. Heart: Regular rhythm. No murmurs, rubs, or gallops. Msk:  Strength and tone normal for age. Extremities/Skin: Warm and dry. Neuro: Alert and oriented X 3. Moves all extremities spontaneously. Gait is normal. CNII-XII grossly in tact. Psych:  Responds to questions appropriately with a normal affect.     ASSESSMENT AND PLAN:  55 y.o. year old female with  1. Nasal abrasion, initial encounter She is to apply Mupirocin Nasal Ointment to area. F/U if does not resolve. - mupirocin nasal ointment (BACTROBAN) 2 %; Place 1 application into the nose 2 (two) times daily. Use one-half of tube in each nostril twice daily for five (5) days. After application, press sides of nose together and gently massage.  Dispense: 10 g; Refill: 0   Signed, [address] [place], [place], [facility] 08/02/2015 10:50 AM

## 2015-11-15 ENCOUNTER — Other Ambulatory Visit: Payer: Self-pay | Admitting: Gynecology

## 2015-11-15 ENCOUNTER — Ambulatory Visit (INDEPENDENT_AMBULATORY_CARE_PROVIDER_SITE_OTHER): Payer: BC Managed Care – PPO | Admitting: Family Medicine

## 2015-11-15 ENCOUNTER — Encounter: Payer: Self-pay | Admitting: Family Medicine

## 2015-11-15 VITALS — BP 130/80 | HR 80 | Temp 98.3°F | Resp 16 | Ht 62.0 in | Wt 172.0 lb

## 2015-11-15 DIAGNOSIS — Z0000 Encounter for general adult medical examination without abnormal findings: Secondary | ICD-10-CM | POA: Diagnosis not present

## 2015-11-15 DIAGNOSIS — M5432 Sciatica, left side: Secondary | ICD-10-CM | POA: Diagnosis not present

## 2015-11-15 DIAGNOSIS — Z1231 Encounter for screening mammogram for malignant neoplasm of breast: Secondary | ICD-10-CM

## 2015-11-15 NOTE — Progress Notes (Signed)
Subjective:    Patient ID: Kristin Whitney, female    DOB: 1960-11-17, 55 y.o.   MRN: [id number]  HPI Patient is a very pleasant 55 year old white female here today to establish care. Past medical history is significant for anemia, borderline hypertension, depression which was situational, ADHD. Her biggest concern now is pain primarily radiating from her left hip down her left leg. She also reports some numbness in her fourth and fifth toes on her left foot. The pain is worse when she stands and walks. She denies any symptoms of cauda equina syndrome. She does have a history of degenerative disc disease in her cervical spine the resolved gradually in 2015. Patient had a colonoscopy in 2010. She is not due again until 2020. She sees a gynecologist performs her pelvic exam, Pap smear, and mammogram. She is due for mammogram but she will schedule this. She is due today for a flu shot but she prefers to wait on this. She would like to be screened for HIV. Hepatitis C screening is already up-to-date Past Medical History:  Diagnosis Date  . ADHD (attention deficit hyperactivity disorder)   . Anemia   . Depression   . Fibrocystic breast changes    left   Past Surgical History:  Procedure Laterality Date  . NASAL SEPTUM SURGERY  1994  . PELVIC LAPAROSCOPY  12/09/2008   PELVIC WASHINGS, EXCISION OF TORSED LEFT PARATUBULAR CYST. ROLLERBALL ENDOMETRIAL ABLATION   Current Outpatient Prescriptions on File Prior to Visit  Medication Sig Dispense Refill  . ALPRAZolam (XANAX) 0.5 MG tablet Take 0.5 mg by mouth at bedtime as needed. Reported on 08/02/2015    . Multiple Vitamin (MULTIVITAMIN) tablet Take 1 tablet by mouth daily.     No current facility-administered medications on file prior to visit.    Allergies  Allergen Reactions  . Clarithromycin    Social History   Social History  . Marital status: Married    Spouse name: N/A  . Number of children: N/A  . Years of education: N/A    Occupational History  . Not on file.   Social History Main Topics  . Smoking status: Never Smoker  . Smokeless tobacco: Never Used  . Alcohol use 0.0 oz/week     Comment: WINE - rare  . Drug use: No  . Sexual activity: Yes    Birth control/ protection: None, Other-see comments     Comment: PATIENT'S HUSBAND WITH VASECTOMY   Other Topics Concern  . Not on file   Social History Narrative  . No narrative on file   Family History  Problem Relation Age of Onset  . Hyperlipidemia Mother   . Alzheimer's disease Mother   . Breast cancer Maternal Aunt   . Breast cancer Maternal Aunt   . Stroke Father   . Alcohol abuse Father       Review of Systems  All other systems reviewed and are negative.      Objective:   Physical Exam  Constitutional: She is oriented to person, place, and time. She appears well-developed and well-nourished. No distress.  HENT:  Head: Normocephalic and atraumatic.  Right Ear: External ear normal.  Left Ear: External ear normal.  Nose: Nose normal.  Mouth/Throat: Oropharynx is clear and moist. No oropharyngeal exudate.  Eyes: Conjunctivae and EOM are normal. Pupils are equal, round, and reactive to light. Right eye exhibits no discharge. Left eye exhibits no discharge. No scleral icterus.  Neck: Normal range of motion. Neck supple. No  JVD present. No tracheal deviation present. No thyromegaly present.  Cardiovascular: Normal rate, regular rhythm, normal heart sounds and intact distal pulses.  Exam reveals no gallop and no friction rub.   No murmur heard. Pulmonary/Chest: Effort normal and breath sounds normal. No stridor. No respiratory distress. She has no wheezes. She has no rales. She exhibits no tenderness.  Abdominal: Soft. Bowel sounds are normal. She exhibits no distension and no mass. There is no tenderness. There is no rebound and no guarding.  Musculoskeletal: Normal range of motion. She exhibits no edema, tenderness or deformity.   Lymphadenopathy:    She has no cervical adenopathy.  Neurological: She is alert and oriented to person, place, and time. She has normal reflexes. She displays normal reflexes. No cranial nerve deficit. She exhibits normal muscle tone. Coordination normal.  Skin: Skin is warm. No rash noted. She is not diaphoretic. No erythema. No pallor.  Psychiatric: She has a normal mood and affect. Her behavior is normal. Judgment and thought content normal.  Vitals reviewed.         Assessment & Plan:  Routine general medical examination at a health care facility - Plan: CBC with Differential/Platelet, COMPLETE METABOLIC PANEL WITH GFR, Lipid panel, TSH, HIV antibody, VITAMIN D 25 Hydroxy (Vit-D Deficiency, Fractures)  Left sided sciatica - Plan: DG Lumbar Spine Complete  Patient's physical exam today is completely normal. I will have her return fasting for a CBC, CMP, fasting lipid panel, TSH, HIV, and a vitamin D level. Patient will schedule her mammogram. Pap smears performed at her gynecologist. Colonoscopy is up-to-date. Hepatitis C screening is up-to-date. I will obtain an x-ray of the lumbar spine given the symptoms that she has in her left leg which sound like sciatica. Further plan to be determined based on the results of the x-ray

## 2015-11-16 ENCOUNTER — Other Ambulatory Visit: Payer: BC Managed Care – PPO

## 2015-11-16 LAB — LIPID PANEL
Cholesterol: 175 mg/dL (ref 125–200)
HDL: 70 mg/dL (ref >=46)
LDL Cholesterol: 88 mg/dL (ref <130)
Total CHOL/HDL Ratio: 2.5 Ratio (ref <=5.0)
Triglycerides: 87 mg/dL (ref <150)
VLDL: 17 mg/dL (ref <30)

## 2015-11-16 LAB — CBC WITH DIFFERENTIAL/PLATELET
Basophils Absolute: 65 cells/uL (ref 0–200)
Basophils Relative: 1 %
Eosinophils Absolute: 65 cells/uL (ref 15–500)
Eosinophils Relative: 1 %
HCT: 40.5 % (ref 35.0–45.0)
Hemoglobin: 13.3 g/dL (ref 12.0–15.0)
Lymphocytes Relative: 26 %
Lymphs Abs: 1690 cells/uL (ref 850–3900)
MCH: 28.9 pg (ref 27.0–33.0)
MCHC: 32.8 g/dL (ref 32.0–36.0)
MCV: 88 fL (ref 80.0–100.0)
MPV: 11.1 fL (ref 7.5–12.5)
Monocytes Absolute: 520 cells/uL (ref 200–950)
Monocytes Relative: 8 %
Neutro Abs: 4160 cells/uL (ref 1500–7800)
Neutrophils Relative %: 64 %
Platelets: 265 10*3/uL (ref 140–400)
RBC: 4.6 MIL/uL (ref 3.80–5.10)
RDW: 14.1 % (ref 11.0–15.0)
WBC: 6.5 10*3/uL (ref 3.8–10.8)

## 2015-11-16 LAB — COMPLETE METABOLIC PANEL WITH GFR
ALT: 15 U/L (ref 6–29)
AST: 18 U/L (ref 10–35)
Albumin: 4.1 g/dL (ref 3.6–5.1)
Alkaline Phosphatase: 70 U/L (ref 33–130)
BUN: 9 mg/dL (ref 7–25)
CO2: 27 mmol/L (ref 20–31)
Calcium: 9.1 mg/dL (ref 8.6–10.4)
Chloride: 97 mmol/L — ABNORMAL LOW (ref 98–110)
Creat: 0.6 mg/dL (ref 0.50–1.05)
GFR, Est African American: >89 mL/min (ref >=60)
GFR, Est Non African American: >89 mL/min (ref >=60)
Glucose, Bld: 84 mg/dL (ref 70–99)
Potassium: 4.4 mmol/L (ref 3.5–5.3)
Sodium: 137 mmol/L (ref 135–146)
Total Bilirubin: 0.5 mg/dL (ref 0.2–1.2)
Total Protein: 7 g/dL (ref 6.1–8.1)

## 2015-11-16 LAB — TSH: TSH: 1.57 mIU/L

## 2015-11-17 LAB — HIV ANTIBODY (ROUTINE TESTING W REFLEX): HIV 1&2 Ab, 4th Generation: NONREACTIVE

## 2015-11-17 LAB — VITAMIN D 25 HYDROXY (VIT D DEFICIENCY, FRACTURES): Vit D, 25-Hydroxy: 31 ng/mL (ref 30–100)

## 2015-11-22 ENCOUNTER — Ambulatory Visit
Admission: RE | Admit: 2015-11-22 | Discharge: 2015-11-22 | Disposition: A | Discharge status: Home / Self Care | Payer: BC Managed Care – PPO | Source: Ambulatory Visit | Attending: Gynecology | Admitting: Gynecology

## 2015-11-22 DIAGNOSIS — Z1231 Encounter for screening mammogram for malignant neoplasm of breast: Secondary | ICD-10-CM

## 2015-12-13 ENCOUNTER — Encounter: Payer: Self-pay | Admitting: Gynecology

## 2015-12-13 ENCOUNTER — Ambulatory Visit (INDEPENDENT_AMBULATORY_CARE_PROVIDER_SITE_OTHER): Payer: BC Managed Care – PPO | Admitting: Gynecology

## 2015-12-13 VITALS — BP 124/80 | Ht 62.0 in | Wt 168.0 lb

## 2015-12-13 DIAGNOSIS — R102 Pelvic and perineal pain: Secondary | ICD-10-CM

## 2015-12-13 DIAGNOSIS — Z23 Encounter for immunization: Secondary | ICD-10-CM | POA: Diagnosis not present

## 2015-12-13 DIAGNOSIS — D251 Intramural leiomyoma of uterus: Secondary | ICD-10-CM | POA: Diagnosis not present

## 2015-12-13 DIAGNOSIS — N951 Menopausal and female climacteric states: Secondary | ICD-10-CM | POA: Diagnosis not present

## 2015-12-13 DIAGNOSIS — Z01419 Encounter for gynecological examination (general) (routine) without abnormal findings: Secondary | ICD-10-CM | POA: Diagnosis not present

## 2015-12-13 NOTE — Patient Instructions (Signed)
Estradiol vaginal tablets What is this medicine? ESTRADIOL (es tra DYE ole) vaginal tablet is used to help relieve symptoms of vaginal irritation and dryness that occurs in some women during menopause. This medicine may be used for other purposes; ask your health care provider or pharmacist if you have questions. What should I tell my health care provider before I take this medicine? They need to know if you have any of these conditions: -abnormal vaginal bleeding -blood vessel disease or blood clots -breast, cervical, endometrial, ovarian, liver, or uterine cancer -dementia -diabetes -gallbladder disease -heart disease or recent heart attack -high blood pressure -high cholesterol -high level of calcium in the blood -hysterectomy -kidney disease -liver disease -migraine headaches -protein C deficiency -protein S deficiency -stroke -systemic lupus erythematosus (SLE) -tobacco smoker -an unusual or allergic reaction to estrogens, other hormones, medicines, foods, dyes, or preservatives -pregnant or trying to get pregnant -breast-feeding How should I use this medicine? This medicine is only for use in the vagina. Do not take by mouth. Wash and dry your hands before and after use. Read package directions carefully. Unwrap the applicator package. Be sure to use a new applicator for each dose. Use at the same time each day. If the tablet has fallen out of the applicator, but is still in the package, carefully place it back into the applicator. If the tablet has fallen out of the package, that applicator should be thrown out and you should use a new applicator containing a new tablet. Lie on your back, part and bend your knees. Gently insert the applicator as far as comfortably possible into the vagina. Then, gently press the plunger until the plunger is fully depressed. This will release the tablet into the vagina. Gently remove the applicator. Throw away the applicator after use. Do not use  your medicine more often than directed. Do not stop using except on the advice of your doctor or health care professional. Talk to your pediatrician regarding the use of this medicine in children. This medicine is not approved for use in children. A patient package insert for the product will be given with each prescription and refill. Read this sheet carefully each time. The sheet may change frequently. Overdosage: If you think you have taken too much of this medicine contact a poison control center or emergency room at once. NOTE: This medicine is only for you. Do not share this medicine with others. What if I miss a dose? If you miss a dose, take it as soon as you can. If it is almost time for your next dose, take only that dose. Do not take double or extra doses. What may interact with this medicine? Do not take this medicine with any of the following medications: -aromatase inhibitors like aminoglutethimide, anastrozole, exemestane, letrozole, testolactone This medicine may also interact with the following medications: -antibiotics used to treat tuberculosis like rifabutin, rifampin and rifapentene -raloxifene or tamoxifen -warfarin This list may not describe all possible interactions. Give your health care provider a list of all the medicines, herbs, non-prescription drugs, or dietary supplements you use. Also tell them if you smoke, drink alcohol, or use illegal drugs. Some items may interact with your medicine. What should I watch for while using this medicine? Visit your health care professional for regular checks on your progress. You will need a regular breast and pelvic exam. You should also discuss the need for regular mammograms with your health care professional, and follow his or her guidelines. This medicine can make  your body retain fluid, making your fingers, hands, or ankles swell. Your blood pressure can go up. Contact your doctor or health care professional if you feel you are  retaining fluid. If you have any reason to think you are pregnant; stop taking this medicine at once and contact your doctor or health care professional. Tobacco smoking increases the risk of getting a blood clot or having a stroke, especially if you are more than 53 years old. You are strongly advised not to smoke. If you wear contact lenses and notice visual changes, or if the lenses begin to feel uncomfortable, consult your eye care specialist. If you are going to have elective surgery, you may need to stop taking this medicine beforehand. Consult your health care professional for advice prior to scheduling the surgery. What side effects may I notice from receiving this medicine? Side effects that you should report to your doctor or health care professional as soon as possible: -allergic reactions like skin rash, itching or hives, swelling of the face, lips, or tongue -breast tissue changes or discharge -changes in vision -chest pain -confusion, trouble speaking or understanding -dark urine -general ill feeling or flu-like symptoms -light-colored stools -nausea, vomiting -pain, swelling, warmth in the leg -right upper belly pain -severe headaches -shortness of breath -sudden numbness or weakness of the face, arm or leg -trouble walking, dizziness, loss of balance or coordination -unusual vaginal bleeding -yellowing of the eyes or skin Side effects that usually do not require medical attention (report to your doctor or health care professional if they continue or are bothersome): -hair loss -increased hunger or thirst -increased urination -symptoms of vaginal infection like itching, irritation or unusual discharge -unusually weak or tired This list may not describe all possible side effects. Call your doctor for medical advice about side effects. You may report side effects to FDA at 1-800-FDA-1088. Where should I keep my medicine? Keep out of the reach of children. Store at room  temperature between 15 and 30 degrees C (59 and 86 degrees F). Throw away any unused medicine after the expiration date. NOTE: This sheet is a summary. It may not cover all possible information. If you have questions about this medicine, talk to your doctor, pharmacist, or health care provider.    2016, Elsevier/Gold Standard. (2014-02-08 09:22:51) Perimenopause Perimenopause is the time when your body begins to move into the menopause (no menstrual period for 12 straight months). It is a natural process. Perimenopause can begin 2-8 years before the menopause and usually lasts for 1 year after the menopause. During this time, your ovaries may or may not produce an egg. The ovaries vary in their production of estrogen and progesterone hormones each month. This can cause irregular menstrual periods, difficulty getting pregnant, vaginal bleeding between periods, and uncomfortable symptoms. CAUSES  Irregular production of the ovarian hormones, estrogen and progesterone, and not ovulating every month.  Other causes include:  Tumor of the pituitary gland in the brain.  Medical disease that affects the ovaries.  Radiation treatment.  Chemotherapy.  Unknown causes.  Heavy smoking and excessive alcohol intake can bring on perimenopause sooner. SIGNS AND SYMPTOMS   Hot flashes.  Night sweats.  Irregular menstrual periods.  Decreased sex drive.  Vaginal dryness.  Headaches.  Mood swings.  Depression.  Memory problems.  Irritability.  Tiredness.  Weight gain.  Trouble getting pregnant.  The beginning of losing bone cells (osteoporosis).  The beginning of hardening of the arteries (atherosclerosis). DIAGNOSIS  Your health care provider  will make a diagnosis by analyzing your age, menstrual history, and symptoms. He or she will do a physical exam and note any changes in your body, especially your female organs. Female hormone tests may or may not be helpful depending on the  amount of female hormones you produce and when you produce them. However, other hormone tests may be helpful to rule out other problems. TREATMENT  In some cases, no treatment is needed. The decision on whether treatment is necessary during the perimenopause should be made by you and your health care provider based on how the symptoms are affecting you and your lifestyle. Various treatments are available, such as:  Treating individual symptoms with a specific medicine for that symptom.  Herbal medicines that can help specific symptoms.  Counseling.  Group therapy. HOME CARE INSTRUCTIONS   Keep track of your menstrual periods (when they occur, how heavy they are, how long between periods, and how long they last) as well as your symptoms and when they started.  Only take over-the-counter or prescription medicines as directed by your health care provider.  Sleep and rest.  Exercise.  Eat a diet that contains calcium (good for your bones) and soy (acts like the estrogen hormone).  Do not smoke.  Avoid alcoholic beverages.  Take vitamin supplements as recommended by your health care provider. Taking vitamin E may help in certain cases.  Take calcium and vitamin D supplements to help prevent bone loss.  Group therapy is sometimes helpful.  Acupuncture may help in some cases. SEEK MEDICAL CARE IF:   You have questions about any symptoms you are having.  You need a referral to a specialist (gynecologist, psychiatrist, or psychologist). SEEK IMMEDIATE MEDICAL CARE IF:   You have vaginal bleeding.  Your period lasts longer than 8 days.  Your periods are recurring sooner than 21 days.  You have bleeding after intercourse.  You have severe depression.  You have pain when you urinate.  You have severe headaches.  You have vision problems.   This information is not intended to replace advice given to you by your health care provider. Make sure you discuss any questions you  have with your health care provider.   Document Released: 04/03/2004 Document Revised: 03/17/2014 Document Reviewed: 09/23/2012 Elsevier Interactive Patient Education 2016 Elsevier Inc. Influenza Virus Vaccine (Flucelvax) What is this medicine? INFLUENZA VIRUS VACCINE (in floo EN zuh VAHY ruhs vak SEEN) helps to reduce the risk of getting influenza also known as the flu. The vaccine only helps protect you against some strains of the flu. This medicine may be used for other purposes; ask your health care provider or pharmacist if you have questions. What should I tell my health care provider before I take this medicine? They need to know if you have any of these conditions: -bleeding disorder like hemophilia -fever or infection -Guillain-Barre syndrome or other neurological problems -immune system problems -infection with the human immunodeficiency virus (HIV) or AIDS -low blood platelet counts -multiple sclerosis -an unusual or allergic reaction to influenza virus vaccine, other medicines, foods, dyes or preservatives -pregnant or trying to get pregnant -breast-feeding How should I use this medicine? This vaccine is for injection into a muscle. It is given by a health care professional. A copy of Vaccine Information Statements will be given before each vaccination. Read this sheet carefully each time. The sheet may change frequently. Talk to your pediatrician regarding the use of this medicine in children. Special care may be needed. Overdosage: If  you think you've taken too much of this medicine contact a poison control center or emergency room at once. Overdosage: If you think you have taken too much of this medicine contact a poison control center or emergency room at once. NOTE: This medicine is only for you. Do not share this medicine with others. What if I miss a dose? This does not apply. What may interact with this medicine? -chemotherapy or radiation therapy -medicines that  lower your immune system like etanercept, anakinra, infliximab, and adalimumab -medicines that treat or prevent blood clots like warfarin -phenytoin -steroid medicines like prednisone or cortisone -theophylline -vaccines This list may not describe all possible interactions. Give your health care provider a list of all the medicines, herbs, non-prescription drugs, or dietary supplements you use. Also tell them if you smoke, drink alcohol, or use illegal drugs. Some items may interact with your medicine. What should I watch for while using this medicine? Report any side effects that do not go away within 3 days to your doctor or health care professional. Call your health care provider if any unusual symptoms occur within 6 weeks of receiving this vaccine. You may still catch the flu, but the illness is not usually as bad. You cannot get the flu from the vaccine. The vaccine will not protect against colds or other illnesses that may cause fever. The vaccine is needed every year. What side effects may I notice from receiving this medicine? Side effects that you should report to your doctor or health care professional as soon as possible: -allergic reactions like skin rash, itching or hives, swelling of the face, lips, or tongue Side effects that usually do not require medical attention (Report these to your doctor or health care professional if they continue or are bothersome.): -fever -headache -muscle aches and pains -pain, tenderness, redness, or swelling at the injection site -tiredness This list may not describe all possible side effects. Call your doctor for medical advice about side effects. You may report side effects to FDA at 1-800-FDA-1088. Where should I keep my medicine? The vaccine will be given by a health care professional in a clinic, pharmacy, doctor's office, or other health care setting. You will not be given vaccine doses to store at home. NOTE: This sheet is a summary. It may  not cover all possible information. If you have questions about this medicine, talk to your doctor, pharmacist, or health care provider.    2016, Elsevier/Gold Standard. (2011-02-05 14:06:47)

## 2015-12-13 NOTE — Addendum Note (Signed)
Addended by: Burnett Kanaris on: 12/13/2015 10:24 AM   Modules accepted: Orders

## 2015-12-13 NOTE — Progress Notes (Signed)
Kristin Whitney 1961/01/08 [id number]   History:    55 y.o.  for annual exam with the only complaint is of on and off left lower quadrant discomfort. Patient did have an ultrasound last year she had a small intramural fibroid 9 x 11 mm had been unchanged in 2013 and her ovaries were normal.. Her PCP did her blood work recently. Patient is perimenopausal she'll skip cycles every other month. Review of her record indicated that several years ago she had resectoscopic polypectomy as well as endometrial ablation. Also in the past she's had a left ovarian cystectomy for benign paratubal cyst that was causing her pain as well as lysis of tubo-ovarian adhesions.Patient also has informed me that greater than 10 years ago she had cryotherapy of her cervix but subsequent Pap smears have been normal. Patient had a normal colonoscopy in 2011.  Past medical history,surgical history, family history and social history were all reviewed and documented in the EPIC chart.  Gynecologic History Patient's last menstrual period was 12/01/2015. Contraception: vasectomy Last Pap: 2014. Results were: normal Last mammogram: 2017. Results were: normal  Obstetric History OB History  Gravida Para Term Preterm AB Living  2 2 1     2   SAB TAB Ectopic Multiple Live Births          2    # Outcome Date GA Lbr Len/2nd Weight Sex Delivery Anes PTL Lv  2 Para     F Vag-Spont  N LIV  1 Term     M Vag-Spont  N LIV       ROS: A ROS was performed and pertinent positives and negatives are included in the history.  GENERAL: No fevers or chills. HEENT: No change in vision, no earache, sore throat or sinus congestion. NECK: No pain or stiffness. CARDIOVASCULAR: No chest pain or pressure. No palpitations. PULMONARY: No shortness of breath, cough or wheeze. GASTROINTESTINAL: No abdominal pain, nausea, vomiting or diarrhea, melena or bright red blood per rectum. GENITOURINARY: No urinary frequency, urgency, hesitancy or dysuria.  MUSCULOSKELETAL: No joint or muscle pain, no back pain, no recent trauma. DERMATOLOGIC: No rash, no itching, no lesions. ENDOCRINE: No polyuria, polydipsia, no heat or cold intolerance. No recent change in weight. HEMATOLOGICAL: No anemia or easy bruising or bleeding. NEUROLOGIC: No headache, seizures, numbness, tingling or weakness. PSYCHIATRIC: No depression, no loss of interest in normal activity or change in sleep pattern.     Exam: chaperone present  BP 124/80   Ht 5\' 2"  (1.575 m)   Wt 168 lb (76.2 kg)   LMP 12/01/2015   BMI 30.73 kg/m   Body mass index is 30.73 kg/m.  General appearance : Well developed well nourished female. No acute distress HEENT: Eyes: no retinal hemorrhage or exudates,  Neck supple, trachea midline, no carotid bruits, no thyroidmegaly Lungs: Clear to auscultation, no rhonchi or wheezes, or rib retractions  Heart: Regular rate and rhythm, no murmurs or gallops Breast:Examined in sitting and supine position were symmetrical in appearance, no palpable masses or tenderness,  no skin retraction, no nipple inversion, no nipple discharge, no skin discoloration, no axillary or supraclavicular lymphadenopathy Abdomen: no palpable masses or tenderness, no rebound or guarding Extremities: no edema or skin discoloration or tenderness  Pelvic:  Bartholin, Urethra, Skene Glands: Within normal limits             Vagina: No gross lesions or discharge  Cervix: No gross lesions or discharge  Uterus  anteverted, normal size, shape  and consistency, non-tender and mobile  Adnexa  Without masses or tenderness  Anus and perineum  normal   Rectovaginal  normal sphincter tone without palpated masses or tenderness             Hemoccult cards will be provided     Assessment/Plan:  55 y.o. female for annual exam requesting the flu vaccine today. It was administered after she was counseled. Because of her left lower quadrant discomfort on and off we are going to order an ultrasound  here in the office in the next several weeks. Her Pap smear was done today. Her PCP did her blood work. She was reminded of the importance of monthly breast exam. Literature information on the perimenopause as well as on Vagifem was provided.   Terrance Mass MD, 10:15 AM 12/13/2015

## 2015-12-14 LAB — PAP IG W/ RFLX HPV ASCU

## 2015-12-19 ENCOUNTER — Other Ambulatory Visit: Payer: Self-pay | Admitting: Gynecology

## 2015-12-19 ENCOUNTER — Ambulatory Visit (INDEPENDENT_AMBULATORY_CARE_PROVIDER_SITE_OTHER): Payer: BC Managed Care – PPO

## 2015-12-19 ENCOUNTER — Ambulatory Visit (INDEPENDENT_AMBULATORY_CARE_PROVIDER_SITE_OTHER): Payer: BC Managed Care – PPO | Admitting: Gynecology

## 2015-12-19 DIAGNOSIS — D251 Intramural leiomyoma of uterus: Secondary | ICD-10-CM | POA: Diagnosis not present

## 2015-12-19 DIAGNOSIS — N8312 Corpus luteum cyst of left ovary: Secondary | ICD-10-CM | POA: Diagnosis not present

## 2015-12-19 DIAGNOSIS — R102 Pelvic and perineal pain: Secondary | ICD-10-CM | POA: Diagnosis not present

## 2015-12-19 DIAGNOSIS — N83202 Unspecified ovarian cyst, left side: Secondary | ICD-10-CM | POA: Diagnosis not present

## 2015-12-19 NOTE — Patient Instructions (Signed)
Diagnostic Laparoscopy A diagnostic laparoscopy is a procedure to diagnose diseases in the abdomen. During the procedure, a thin, lighted, pencil-sized instrument called a laparoscope is inserted into the abdomen through an incision. The laparoscope allows your health care provider to look at the organs inside your body. LET [facility] CARE PROVIDER KNOW ABOUT:  Any allergies you have.  All medicines you are taking, including vitamins, herbs, eye drops, creams, and over-the-counter medicines.  Previous problems you or members of your family have had with the use of anesthetics.  Any blood disorders you have.  Previous surgeries you have had.  Medical conditions you have. RISKS AND COMPLICATIONS  Generally, this is a safe procedure. However, problems can occur, which may include:  Infection.  Bleeding.  Damage to other organs.  Allergic reaction to the anesthetics used during the procedure. BEFORE THE PROCEDURE  Do not eat or drink anything after midnight on the night before the procedure or as directed by your health care provider.  Ask your health care provider about:  Changing or stopping your regular medicines.  Taking medicines such as aspirin and ibuprofen. These medicines can thin your blood. Do not take these medicines before your procedure if your health care provider instructs you not to.  Plan to have someone take you home after the procedure. PROCEDURE  You may be given a medicine to help you relax (sedative).  You will be given a medicine to make you sleep (general anesthetic).  Your abdomen will be inflated with a gas. This will make your organs easier to see.  Small incisions will be made in your abdomen.  A laparoscope and other small instruments will be inserted into the abdomen through the incisions.  A tissue sample may be removed from an organ in the abdomen for examination.  The instruments will be removed from the abdomen.  The gas will be  released.  The incisions will be closed with stitches (sutures). AFTER THE PROCEDURE  Your blood pressure, heart rate, breathing rate, and blood oxygen level will be monitored often until the medicines you were given have worn off.   This information is not intended to replace advice given to you by your health care provider. Make sure you discuss any questions you have with your health care provider.   Document Released: 06/02/2000 Document Revised: 11/15/2014 Document Reviewed: 10/07/2013 Elsevier Interactive Patient Education [employer].

## 2015-12-19 NOTE — Progress Notes (Signed)
    on October 5 of this year and had voiced concerns about left lower quadrant discomfort for the past several months.. Patient did have an ultrasound last year she had a small intramural fibroid 9 x 11 mm had been unchanged in 2013 and her ovaries were normal. She does report at times episodes of constipation.Review of her record indicated that several years ago she had resectoscopic polypectomy as well as endometrial ablation. Also in the past she's had a left ovarian cystectomy for benign paratubal cyst that was causing her pain as well as lysis of tubo-ovarian adhesions. Patient did have a colonoscopy back in 2010 which of been reported to be benign. She brought today her fecal Hemoccult cards for testing. She was in no acute distress today. Her ultrasound today demonstrated the following:  Ultrasound: Uterus 9.5 x 5.0 x 4.5 cm with endometrial stripe is 7.8 mm. Intramural fibroid measuring 9 mm was noted. Right ovary was normal. Left ovarian thick wall corpus luteum cyst measuring 16 x 13 mm with positive color flow Doppler noted at the periphery. No fluid in the cul-de-sac.  Assessment/plan: Patient with past history of left ovarian cyst and paratubal cyst removed and lysis of pelvic adhesion several years ago. Patient for several months now complaining of left lower abdominal discomfort but also had history of constipation. I'm going to recommend that she follow-up with her gastroenterologist in the event she many to be reevaluated with a colonoscopy. If GI clears her she will contact the office and we will plan on a laparoscopic evaluation and possible left salpingo-oophorectomy and right salpingectomy. Literature information was provided. We also discussed today her recent blood work which had consisted of CBC, comprehensive metabolic panel, fasting lipid profile, TSH, urinalysis, Pap smear and vitamin D level which were all normal.   Greater than 50% time was spent counseling coordinating care for  this patient time of consultation 10 minutes

## 2015-12-21 ENCOUNTER — Other Ambulatory Visit: Payer: Self-pay | Admitting: Anesthesiology

## 2015-12-21 DIAGNOSIS — Z1211 Encounter for screening for malignant neoplasm of colon: Secondary | ICD-10-CM

## 2015-12-24 ENCOUNTER — Other Ambulatory Visit: Payer: BC Managed Care – PPO

## 2015-12-24 ENCOUNTER — Ambulatory Visit: Payer: BC Managed Care – PPO | Admitting: Gynecology

## 2016-01-02 ENCOUNTER — Telehealth: Payer: Self-pay | Admitting: *Deleted

## 2016-01-02 NOTE — Telephone Encounter (Signed)
Pt received her pap results on my chart and she is questioning what pap noted "The specimen is partially obscured by inflammation"?  Pt asked what does this mean? States it has never been on previous pap smears.  Please advise

## 2016-01-02 NOTE — Telephone Encounter (Signed)
Please inform patient that this is nothing unusual sometimes you can see this before after a menstrual cycle after recent intercourse. She's having any discharge she should come in to the office and we can do a wet prep. Please reassure there was no dysplasia or any premalignant cells.

## 2016-01-03 NOTE — Telephone Encounter (Signed)
Pt informed with the below note. 

## 2016-03-17 ENCOUNTER — Encounter: Payer: Self-pay | Admitting: Family Medicine

## 2016-03-17 ENCOUNTER — Ambulatory Visit (INDEPENDENT_AMBULATORY_CARE_PROVIDER_SITE_OTHER): Payer: BC Managed Care – PPO | Admitting: Family Medicine

## 2016-03-17 VITALS — BP 132/70 | HR 74 | Temp 98.3°F | Resp 14 | Ht 62.0 in | Wt 172.0 lb

## 2016-03-17 DIAGNOSIS — N6314 Unspecified lump in the right breast, lower inner quadrant: Secondary | ICD-10-CM | POA: Diagnosis not present

## 2016-03-17 MED ORDER — MELOXICAM 15 MG PO TABS
15.0000 mg | ORAL_TABLET | Freq: Every day | ORAL | 0 refills | Status: DC
Start: 2016-03-17 — End: 2016-07-18

## 2016-03-17 NOTE — Progress Notes (Signed)
   Subjective:    Patient ID: Kristin Whitney, female    DOB: 08-09-1960, 56 y.o.   MRN: [id number]  HPI Patient felt a lump in her right breast. The lump is located at the 4:00 position from the nipple. It is approximately 1 cm in diameter tender and firm. There is no other palpable nodule in the breast. She had a normal mammogram in September Past Medical History:  Diagnosis Date  . ADHD (attention deficit hyperactivity disorder)   . Anemia   . Depression   . Fibrocystic breast changes    left   Past Surgical History:  Procedure Laterality Date  . NASAL SEPTUM SURGERY  1994  . PELVIC LAPAROSCOPY  12/09/2008   PELVIC WASHINGS, EXCISION OF TORSED LEFT PARATUBULAR CYST. ROLLERBALL ENDOMETRIAL ABLATION   Current Outpatient Prescriptions on File Prior to Visit  Medication Sig Dispense Refill  . ALPRAZolam (XANAX) 0.5 MG tablet Take 0.5 mg by mouth at bedtime as needed. Reported on 08/02/2015    . Cholecalciferol (VITAMIN D PO) Take 1 tablet by mouth daily.    . Cyanocobalamin (VITAMIN B-12 PO) Take 1 tablet by mouth daily.    Marland Kitchen MILK THISTLE PLUS PO Take by mouth as directed.    . Multiple Vitamin (MULTIVITAMIN) tablet Take 1 tablet by mouth daily.     No current facility-administered medications on file prior to visit.    Allergies  Allergen Reactions  . Clarithromycin    Social History   Social History  . Marital status: Married    Spouse name: N/A  . Number of children: N/A  . Years of education: N/A   Occupational History  . Not on file.   Social History Main Topics  . Smoking status: Never Smoker  . Smokeless tobacco: Never Used  . Alcohol use 0.0 oz/week     Comment: WINE - rare  . Drug use: No  . Sexual activity: Yes    Birth control/ protection: None, Other-see comments     Comment: PATIENT'S HUSBAND WITH VASECTOMY   Other Topics Concern  . Not on file   Social History Narrative  . No narrative on file      Review of Systems  All other systems  reviewed and are negative.      Objective:   Physical Exam  Cardiovascular: Normal rate, regular rhythm and normal heart sounds.   Pulmonary/Chest: Effort normal and breath sounds normal.  Vitals reviewed. see hpi        Assessment & Plan:  Breast lump on right side at 4 o'clock position - Plan: US BREAST COMPLETE UNI RIGHT INC AXILLA, MM Digital Diagnostic Unilat R  I have a difficult time feeling the lump today on exam. I believe this is likely a benign fibrocystic change rather than a pathologic lesion. I will schedule the patient for a diagnostic mammogram of the right breast along with an ultrasound if needed to ensure that this is benign but I am reassured by the exam today.

## 2016-03-18 ENCOUNTER — Ambulatory Visit
Admission: RE | Admit: 2016-03-18 | Discharge: 2016-03-18 | Disposition: A | Discharge status: Home / Self Care | Payer: BC Managed Care – PPO | Source: Ambulatory Visit | Attending: Family Medicine | Admitting: Family Medicine

## 2016-03-18 ENCOUNTER — Other Ambulatory Visit: Payer: Self-pay | Admitting: Family Medicine

## 2016-03-18 DIAGNOSIS — N6314 Unspecified lump in the right breast, lower inner quadrant: Secondary | ICD-10-CM

## 2016-03-20 ENCOUNTER — Telehealth: Payer: Self-pay | Admitting: Family Medicine

## 2016-03-20 ENCOUNTER — Encounter: Payer: Self-pay | Admitting: Family Medicine

## 2016-03-20 ENCOUNTER — Ambulatory Visit (INDEPENDENT_AMBULATORY_CARE_PROVIDER_SITE_OTHER): Payer: BC Managed Care – PPO | Admitting: Family Medicine

## 2016-03-20 VITALS — BP 120/76 | HR 76 | Temp 98.7°F | Resp 18 | Wt 171.0 lb

## 2016-03-20 DIAGNOSIS — R5382 Chronic fatigue, unspecified: Secondary | ICD-10-CM | POA: Diagnosis not present

## 2016-03-20 LAB — T4, FREE: Free T4: 1.1 ng/dL (ref 0.8–1.8)

## 2016-03-20 LAB — T3, FREE: T3, Free: 3.1 pg/mL (ref 2.3–4.2)

## 2016-03-20 LAB — TSH: TSH: 1.79 mIU/L

## 2016-03-20 NOTE — Telephone Encounter (Signed)
Patient called back and is requesting that you add testing for  thyroid antibodies added to labs from today.

## 2016-03-20 NOTE — Progress Notes (Signed)
Subjective:    Patient ID: Kristin Whitney, female    DOB: 09-28-1960, 56 y.o.   MRN: [id number]  HPI Patient states for years she is been dealing with chronic fatigue. She has no desire energy. Is all she can do some days to get out of bed in the morning. She's had her TSH checked numerous times and found to be normal every time. She specifically wants her T3 and T4 checked. She is also concerned by diffuse episodic soreness and pain out of proportion to exam. She is concerned that there may be some underlying problem. Lab work from September was excellent Past Medical History:  Diagnosis Date  . ADHD (attention deficit hyperactivity disorder)   . Anemia   . Depression   . Fibrocystic breast changes    left   Past Surgical History:  Procedure Laterality Date  . NASAL SEPTUM SURGERY  1994  . PELVIC LAPAROSCOPY  12/09/2008   PELVIC WASHINGS, EXCISION OF TORSED LEFT PARATUBULAR CYST. ROLLERBALL ENDOMETRIAL ABLATION   Current Outpatient Prescriptions on File Prior to Visit  Medication Sig Dispense Refill  . ALPRAZolam (XANAX) 0.5 MG tablet Take 0.5 mg by mouth at bedtime as needed. Reported on 08/02/2015    . Cholecalciferol (VITAMIN D PO) Take 1 tablet by mouth daily.    . Cyanocobalamin (VITAMIN B-12 PO) Take 1 tablet by mouth daily.    . meloxicam (MOBIC) 15 MG tablet Take 1 tablet (15 mg total) by mouth daily. 30 tablet 0  . MILK THISTLE PLUS PO Take by mouth as directed.    . Multiple Vitamin (MULTIVITAMIN) tablet Take 1 tablet by mouth daily.     No current facility-administered medications on file prior to visit.    Allergies  Allergen Reactions  . Clarithromycin    Social History   Social History  . Marital status: Married    Spouse name: N/A  . Number of children: N/A  . Years of education: N/A   Occupational History  . Not on file.   Social History Main Topics  . Smoking status: Never Smoker  . Smokeless tobacco: Never Used  . Alcohol use 0.0 oz/week   Comment: WINE - rare  . Drug use: No  . Sexual activity: Yes    Birth control/ protection: None, Other-see comments     Comment: PATIENT'S HUSBAND WITH VASECTOMY   Other Topics Concern  . Not on file   Social History Narrative  . No narrative on file      Review of Systems  Constitutional: Positive for fatigue. Negative for chills, fever and unexpected weight change.  Musculoskeletal: Positive for arthralgias and myalgias.  All other systems reviewed and are negative.      Objective:   Physical Exam  Constitutional: She appears well-developed and well-nourished.  Cardiovascular: Normal rate, regular rhythm, normal heart sounds and intact distal pulses.   Pulmonary/Chest: Effort normal and breath sounds normal. No respiratory distress. She has no wheezes. She has no rales.  Abdominal: Soft. Bowel sounds are normal.  Lymphadenopathy:    She has no cervical adenopathy.  Vitals reviewed.         Assessment & Plan:  Chronic fatigue - Plan: T3, Free, T4, free, Sedimentation rate, TSH  In accordance with the patient's wishes I will check a T3 and T4 but I do not think thyroid abnormalities explain all of her symptoms. I believe the patient may have fibromyalgia. I will check a sedimentation rate as an initial screening test to evaluate  for autoimmune diseases. If elevated, I will check an ANA, rheumatoid factor, Lyme titers, etc. consider rheumatology referral if elevated. If normal however I would recommend a trial of therapy for fibromyalgia to see if symptoms would improve.

## 2016-03-21 ENCOUNTER — Encounter: Payer: Self-pay | Admitting: Family Medicine

## 2016-03-21 LAB — SEDIMENTATION RATE: Sed Rate: 4 mm/hr (ref 0–30)

## 2016-04-21 ENCOUNTER — Telehealth: Payer: Self-pay | Admitting: *Deleted

## 2016-04-21 DIAGNOSIS — N926 Irregular menstruation, unspecified: Secondary | ICD-10-CM

## 2016-04-21 MED ORDER — MEGESTROL ACETATE 40 MG PO TABS: 40.0000 mg | ORAL_TABLET | Freq: Two times a day (BID) | ORAL | 0 refills | Status: DC

## 2016-04-21 NOTE — Telephone Encounter (Signed)
Call in prescription Megace 40 mg one by mouth twice a day for 10 days and schedule sonohysterogram

## 2016-04-21 NOTE — Telephone Encounter (Signed)
Pt informed with the below note, Rx sent, order placed for [facility].

## 2016-04-21 NOTE — Telephone Encounter (Signed)
Pt called on day 10 of cycle, LMP:04/12/16 states bleeding was heavy when first started changing pad every 2 hours with clots,  now she has normal flow, but still bleeding. Notes have ablation several years ago. No cycle in Nov/Dec, had 1 day of bleeding in Jan. No pain, pt said you are aware she is skipping cycles. Pt said this is the first time this has happened. Please advise

## 2016-04-23 ENCOUNTER — Other Ambulatory Visit: Payer: Self-pay | Admitting: Gynecology

## 2016-04-23 DIAGNOSIS — N920 Excessive and frequent menstruation with regular cycle: Secondary | ICD-10-CM

## 2016-04-23 DIAGNOSIS — N939 Abnormal uterine and vaginal bleeding, unspecified: Secondary | ICD-10-CM

## 2016-04-28 ENCOUNTER — Other Ambulatory Visit: Payer: Self-pay | Admitting: Gynecology

## 2016-04-28 ENCOUNTER — Encounter: Payer: Self-pay | Admitting: Gynecology

## 2016-04-28 ENCOUNTER — Ambulatory Visit (INDEPENDENT_AMBULATORY_CARE_PROVIDER_SITE_OTHER): Payer: BC Managed Care – PPO

## 2016-04-28 ENCOUNTER — Ambulatory Visit (INDEPENDENT_AMBULATORY_CARE_PROVIDER_SITE_OTHER): Payer: BC Managed Care – PPO | Admitting: Gynecology

## 2016-04-28 DIAGNOSIS — N939 Abnormal uterine and vaginal bleeding, unspecified: Secondary | ICD-10-CM

## 2016-04-28 DIAGNOSIS — N920 Excessive and frequent menstruation with regular cycle: Secondary | ICD-10-CM | POA: Diagnosis not present

## 2016-04-28 DIAGNOSIS — N926 Irregular menstruation, unspecified: Secondary | ICD-10-CM | POA: Insufficient documentation

## 2016-04-28 DIAGNOSIS — R9389 Abnormal findings on diagnostic imaging of other specified body structures: Secondary | ICD-10-CM

## 2016-04-28 DIAGNOSIS — R938 Abnormal findings on diagnostic imaging of other specified body structures: Secondary | ICD-10-CM

## 2016-04-28 DIAGNOSIS — N951 Menopausal and female climacteric states: Secondary | ICD-10-CM

## 2016-04-28 NOTE — Patient Instructions (Signed)
Menopause and Hormone Replacement Therapy Introduction WHAT IS HORMONE REPLACEMENT THERAPY? Hormone replacement therapy (HRT) is the use of artificial (synthetic) hormones to replace hormones that your body stops producing during menopause. Menopause is the normal time of life when menstrual periods stop completely and the ovaries stop producing the female hormones estrogen and progesterone. This lack of hormones can affect your health and cause undesirable symptoms. HRT can relieve some of those symptoms. WHAT ARE MY OPTIONS FOR HRT? HRT may consist of the synthetic hormones estrogen and progestin, or it may consist of only estrogen (estrogen-only therapy). You and your health care provider will decide which form of HRT is best for you. If you choose to be on HRT and you have a uterus, estrogen and progestin are usually prescribed. Estrogen-only therapy is used for women who do not have a uterus. Possible options for taking HRT include:  Pills.  Patches.  Gels.  Sprays.  Vaginal cream.  Vaginal rings.  Vaginal inserts. The amount of hormone(s) that you take and how long you take the hormone(s) varies depending on your individual health. It is important to:  Begin HRT with the lowest possible dosage.  Stop HRT as soon as your health care provider tells you to stop.  Work with your health care provider so that you feel informed and comfortable with your decisions. WHAT ARE THE BENEFITS OF HRT? HRT can reduce the frequency and severity of menopausal symptoms. Benefits of HRT vary depending on the menopausal symptoms that you have, the severity of your symptoms, and your overall health. HRT may help to improve the following menopausal symptoms:  Hot flashes and night sweats. These are sudden feelings of heat that spread over the face and body. The skin may turn red, like a blush. Night sweats are hot flashes that happen while you are sleeping or trying to sleep.  Bone loss  (osteoporosis). The body loses calcium more quickly after menopause, causing the bones to become weaker. This can increase the risk for bone breaks (fractures).  Vaginal dryness. The lining of the vagina can become thin and dry, which can cause pain during sexual intercourse or cause infection, burning, or itching.  Urinary tract infections.  Urinary incontinence. This is a decreased ability to control when you urinate.  Irritability.  Short-term memory problems. WHAT ARE THE RISKS OF HRT? Risks of HRT vary depending on your individual health and medical history. Risks of HRT also depend on whether you receive both estrogen and progestin or you receive estrogen only.HRT may increase the risk of:  Spotting. This is when a small amount of bloodleaks from the vagina unexpectedly.  Endometrial cancer. This cancer is in the lining of the uterus (endometrium).  Breast cancer.  Increased density of breast tissue. This can make it harder to find breast cancer on a breast X-ray (mammogram).  Stroke.  Heart attack.  Blood clots.  Gallbladder disease. Risks of HRT can increase if you have any of the following conditions:  Endometrial cancer.  Liver disease.  Heart disease.  Breast cancer.  History of blood clots.  History of stroke. HOW SHOULD I CARE FOR MYSELF WHILE I AM ON HRT?  Take over-the-counter and prescription medicines only as told by your health care provider.  Get mammograms, pelvic exams, and medical checkups as often as told by your health care provider.  Have Pap tests done as often as told by your health care provider. A Pap test is sometimes called a Pap smear. It is a  screening test that is used to check for signs of cancer of the cervix and vagina. A Pap test can also identify the presence of infection or precancerous changes. Pap tests may be done:  Every 3 years, starting at age 76.  Every 5 years, starting after age 88, in combination with testing for  human papillomavirus (HPV).  More often or less often depending on other medical conditions you have, your age, and other risk factors.  It is your responsibility to get your Pap test results. Ask your health care provider or the department performing the test when your results will be ready.  Keep all follow-up visits as told by your health care provider. This is important. WHEN SHOULD I SEEK MEDICAL CARE? Talk with your health care provider if:  You have any of these:  Pain or swelling in your legs.  Shortness of breath.  Chest pain.  Lumps or changes in your breasts or armpits.  Slurred speech.  Pain, burning, or bleeding when you urine.  You develop any of these:  Unusual vaginal bleeding.  Dizziness or headaches.  Weakness or numbness in any part of your arms or legs.  Pain in your abdomen. This information is not intended to replace advice given to you by your health care provider. Make sure you discuss any questions you have with your health care provider. Document Released: 11/23/2002 Document Revised: 08/02/2015 Document Reviewed: 08/28/2014  2017 Elsevier Menopause Menopause is the normal time of life when menstrual periods stop completely. Menopause is complete when you have missed 12 consecutive menstrual periods. It usually occurs between the ages of 7 years and 32 years. Very rarely does a woman develop menopause before the age of 21 years. At menopause, your ovaries stop producing the female hormones estrogen and progesterone. This can cause undesirable symptoms and also affect your health. Sometimes the symptoms may occur 4-5 years before the menopause begins. There is no relationship between menopause and:  Oral contraceptives.  Number of children you had.  Race.  The age your menstrual periods started (menarche). Heavy smokers and very thin women may develop menopause earlier in life. What are the causes?  The ovaries stop producing the female  hormones estrogen and progesterone. Other causes include:  Surgery to remove both ovaries.  The ovaries stop functioning for no known reason.  Tumors of the pituitary gland in the brain.  Medical disease that affects the ovaries and hormone production.  Radiation treatment to the abdomen or pelvis.  Chemotherapy that affects the ovaries. What are the signs or symptoms?  Hot flashes.  Night sweats.  Decrease in sex drive.  Vaginal dryness and thinning of the vagina causing painful intercourse.  Dryness of the skin and developing wrinkles.  Headaches.  Tiredness.  Irritability.  Memory problems.  Weight gain.  Bladder infections.  Hair growth of the face and chest.  Infertility. More serious symptoms include:  Loss of bone (osteoporosis) causing breaks (fractures).  Depression.  Hardening and narrowing of the arteries (atherosclerosis) causing heart attacks and strokes. How is this diagnosed?  When the menstrual periods have stopped for 12 straight months.  Physical exam.  Hormone studies of the blood. How is this treated? There are many treatment choices and nearly as many questions about them. The decisions to treat or not to treat menopausal changes is an individual choice made with your health care provider. Your health care provider can discuss the treatments with you. Together, you can decide which treatment will work best  for you. Your treatment choices may include:  Hormone therapy (estrogen and progesterone).  Non-hormonal medicines.  Treating the individual symptoms with medicine (for example antidepressants for depression).  Herbal medicines that may help specific symptoms.  Counseling by a psychiatrist or psychologist.  Group therapy.  Lifestyle changes including:  Eating healthy.  Regular exercise.  Limiting caffeine and alcohol.  Stress management and meditation.  No treatment. Follow these instructions at home:  Take the  medicine your health care provider gives you as directed.  Get plenty of sleep and rest.  Exercise regularly.  Eat a diet that contains calcium (good for the bones) and soy products (acts like estrogen hormone).  Avoid alcoholic beverages.  Do not smoke.  If you have hot flashes, dress in layers.  Take supplements, calcium, and vitamin D to strengthen bones.  You can use over-the-counter lubricants or moisturizers for vaginal dryness.  Group therapy is sometimes very helpful.  Acupuncture may be helpful in some cases. Contact a health care provider if:  You are not sure you are in menopause.  You are having menopausal symptoms and need advice and treatment.  You are still having menstrual periods after age 45 years.  You have pain with intercourse.  Menopause is complete (no menstrual period for 12 months) and you develop vaginal bleeding.  You need a referral to a specialist (gynecologist, psychiatrist, or psychologist) for treatment. Get help right away if:  You have severe depression.  You have excessive vaginal bleeding.  You fell and think you have a broken bone.  You have pain when you urinate.  You develop leg or chest pain.  You have a fast pounding heart beat (palpitations).  You have severe headaches.  You develop vision problems.  You feel a lump in your breast.  You have abdominal pain or severe indigestion. This information is not intended to replace advice given to you by your health care provider. Make sure you discuss any questions you have with your health care provider. Document Released: 05/17/2003 Document Revised: 08/02/2015 Document Reviewed: 09/23/2012 Elsevier Interactive Patient Education  2017 [employer].

## 2016-04-28 NOTE — Progress Notes (Signed)
   Patient is a 56 year old that presented to the office today for sonohysterogram as part of her evaluation of her irregular menstrual cycles. Patient was tested for menopause back in 2015 2016 in both [facility] is were normal. Patient denies any vasomotor symptoms. She had a normal cycle in October she did not have a cycle November and December. Her menstrual cycle January lasted then half in February lasted 8 days. Patient called the office and she was placed on Megace 40 mg twice a day for 10 days stop her bleeding. She is not bleeding today.Review of her record indicated that several years ago she had resectoscopic polypectomy as well as endometrial ablation. Also in the past she's had a left ovarian cystectomy for benign paratubal cyst that was causing her pain as well as lysis of tubo-ovarian adhesions. Patient had an ultrasound here in the office in October 2017 which the following was noted:  Ultrasound: Uterus 9.5 x 5.0 x 4.5 cm with endometrial stripe is 7.8 mm. Intramural fibroid measuring 9 mm was noted. Right ovary was normal. Left ovarian thick wall corpus luteum cyst measuring 16 x 13 mm with positive color flow Doppler noted at the periphery. No fluid in the cul-de-sac.  Patient saw the gastroenterologist patient no longer having any GI issues her abdominal complaints.  Ultrasound/sono history of today:  Uterus measured 9.5 x 5.3 x 4.7 mm endometrial stripe 13 mm right ovary was normal left ovaries small follicle 11 mm. No fluid in the cul-de-sac. The cervix is then cleansed with Betadine solution. A catheter was introduced into the uterus and normal saline was instilled no cavitary defects only a thickened posterior wall measuring 33 x 12 x 19 mm. The cervix was again cleansed with Betadine solution and endometrial biopsy was obtained and tissue submitted for histological evaluation.     Assessment/plan: Patient perimenopause/menopause skipping cycles but no vasomotor symptoms. Normal FSH  2 in the past. We'll going to check an [facility] today. If her Basye is normal as well as an endometrial biopsy she will be prescribed Provera 10 mg to take 10 days of each month for 3 months and then return back to the office in 3 months for follow-up ultrasound. Effort FSH is elevated we will have patient come to the office for consultation and discuss if treatment is needed or not. Will notify patient with pathology report when it becomes available.

## 2016-04-29 LAB — FOLLICLE STIMULATING HORMONE: FSH: 8.3 m[IU]/mL

## 2016-07-18 ENCOUNTER — Ambulatory Visit (INDEPENDENT_AMBULATORY_CARE_PROVIDER_SITE_OTHER): Payer: BC Managed Care – PPO | Admitting: Family Medicine

## 2016-07-18 ENCOUNTER — Encounter: Payer: Self-pay | Admitting: Family Medicine

## 2016-07-18 VITALS — BP 170/88 | HR 80 | Temp 98.5°F | Resp 18 | Ht 62.0 in | Wt 174.0 lb

## 2016-07-18 DIAGNOSIS — H9192 Unspecified hearing loss, left ear: Secondary | ICD-10-CM | POA: Diagnosis not present

## 2016-07-18 NOTE — Progress Notes (Signed)
Subjective:    Patient ID: Kristin Whitney, female    DOB: [birth year], 56 y.o.   MRN: [id number]  HPI Patient has been having pain under her left mandible for several months.  Mild.  No palpable masses in that area.  Recently has been noticing hearing loss in left > right ear.  Mild according to her.  This week, was driving down a hill and felt a pop in her left ear "like I was in the mountains," and afterwards she has noticed significant hearing loss in her left ear.  Denies ear pain, vertigo or tinnitus.  Denies headache or recent viral infection.   Past Medical History:  Diagnosis Date  . ADHD (attention deficit hyperactivity disorder)   . Anemia   . Depression   . Fibrocystic breast changes    left   Current Outpatient Prescriptions on File Prior to Visit  Medication Sig Dispense Refill  . ALPRAZolam (XANAX) 0.5 MG tablet Take 0.5 mg by mouth at bedtime as needed. Reported on 08/02/2015    . Cholecalciferol (VITAMIN D PO) Take 1 tablet by mouth daily.    . Cyanocobalamin (VITAMIN B-12 PO) Take 1 tablet by mouth daily.    Marland Kitchen MILK THISTLE PLUS PO Take by mouth as directed.    . Multiple Vitamin (MULTIVITAMIN) tablet Take 1 tablet by mouth daily.     No current facility-administered medications on file prior to visit.    Allergies  Allergen Reactions  . Clarithromycin    Social History   Social History  . Marital status: Married    Spouse name: N/A  . Number of children: N/A  . Years of education: N/A   Occupational History  . Not on file.   Social History Main Topics  . Smoking status: Never Smoker  . Smokeless tobacco: Never Used  . Alcohol use 0.0 oz/week     Comment: WINE - rare  . Drug use: No  . Sexual activity: Yes    Birth control/ protection: None, Other-see comments     Comment: PATIENT'S HUSBAND WITH VASECTOMY   Other Topics Concern  . Not on file   Social History Narrative  . No narrative on file      Review of Systems  HENT: Positive for  hearing loss. Negative for congestion, dental problem, drooling, ear discharge, ear pain, facial swelling, nosebleeds, postnasal drip, rhinorrhea, sinus pain, sinus pressure, sneezing, tinnitus and trouble swallowing.   All other systems reviewed and are negative.      Objective:   Physical Exam  Constitutional: She appears well-developed and well-nourished. No distress.  HENT:  Right Ear: External ear normal. No drainage, swelling or tenderness. Tympanic membrane is not injected, not scarred, not perforated, not erythematous, not retracted and not bulging. No middle ear effusion. Decreased hearing is noted.  Left Ear: External ear normal. No drainage. Tympanic membrane is bulging. Tympanic membrane is not injected, not scarred, not perforated and not erythematous. A middle ear effusion is present. No hemotympanum. Decreased hearing is noted.  Nose: Nose normal.  Mouth/Throat: Oropharynx is clear and moist.  Eyes: Conjunctivae are normal.  Neck: Neck supple.  Cardiovascular: Normal rate, regular rhythm and normal heart sounds.   Pulmonary/Chest: Effort normal and breath sounds normal. No stridor. No respiratory distress. She has no wheezes. She has no rales.  Lymphadenopathy:    She has no cervical adenopathy.  Skin: She is not diaphoretic.  Vitals reviewed.         Assessment & Plan:  Acute hearing loss, left - Plan: Ambulatory referral to ENT  Not sure if she is experiencing hearing loss due to eustachian tube dysfunction and effusion or is she may have sudden sensorineural hearing loss.  Recommend ENT consult [facility] to evaluate for possible tympanostomy tube placement vs corticosteroids.  Await the results of this work up.  If pain in left neck persists after, would proceed with imaging.

## 2016-07-22 DIAGNOSIS — M26629 Arthralgia of temporomandibular joint, unspecified side: Secondary | ICD-10-CM | POA: Insufficient documentation

## 2016-07-23 ENCOUNTER — Encounter: Payer: Self-pay | Admitting: Gynecology

## 2016-08-01 ENCOUNTER — Ambulatory Visit (INDEPENDENT_AMBULATORY_CARE_PROVIDER_SITE_OTHER): Payer: BC Managed Care – PPO | Admitting: Family Medicine

## 2016-08-01 ENCOUNTER — Encounter: Payer: Self-pay | Admitting: Family Medicine

## 2016-08-01 VITALS — BP 136/96 | HR 64 | Temp 98.1°F | Resp 16 | Ht 62.0 in | Wt 172.0 lb

## 2016-08-01 DIAGNOSIS — M542 Cervicalgia: Secondary | ICD-10-CM | POA: Diagnosis not present

## 2016-08-01 MED ORDER — PREDNISONE 20 MG PO TABS: ORAL_TABLET | ORAL | 0 refills | Status: DC

## 2016-08-01 NOTE — Progress Notes (Signed)
Subjective:    Patient ID: Kristin Whitney, female    DOB: [birth year], 56 y.o.   MRN: [id number]  HPI  07/18/16 Patient has been having pain under her left mandible for several months.  Mild.  No palpable masses in that area.  Recently has been noticing hearing loss in left > right ear.  Mild according to her.  This week, was driving down a hill and felt a pop in her left ear "like I was in the mountains," and afterwards she has noticed significant hearing loss in her left ear.  Denies ear pain, vertigo or tinnitus.  Denies headache or recent viral infection.  At that time, my plan was: Not sure if she is experiencing hearing loss due to eustachian tube dysfunction and effusion or is she may have sudden sensorineural hearing loss.  Recommend ENT consult ASAP to evaluate for possible tympanostomy tube placement vs corticosteroids.  Await the results of this work up.  If pain in left neck persists after, would proceed with imaging.    08/01/16 Patient was told she had sudden sensorineural hearing loss but spontaneously recovered. No further ENT evaluation is planned according to the patient. However she continues to have significant pain in the left side of her neck. She reports it is a deep intense ache that radiates from deep within her neck to under her left mandible towards the cricoid cartilage. She denies any numbness or tingling in her left arm or in her left leg. She denies any trouble swallowing. She denies any stridor. She denies any dysphasia. She denies any hemoptysis. 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 Past Medical History:  Diagnosis Date  . ADHD (attention deficit hyperactivity disorder)   . Anemia   . Depression   . Fibrocystic breast changes    left   Current Outpatient Prescriptions on File Prior to Visit  Medication Sig Dispense Refill  . ALPRAZolam (XANAX)  0.5 MG tablet Take 0.5 mg by mouth at bedtime as needed. Reported on 08/02/2015    . Cholecalciferol (VITAMIN D PO) Take 1 tablet by mouth daily.    . Cyanocobalamin (VITAMIN B-12 PO) Take 1 tablet by mouth daily.    . medroxyPROGESTERone (PROVERA) 10 MG tablet TAKE ONE TABLET BY MOUTH EVERY DAY FOR 10 DAYS OF THE MONTH FOR 3 MONTHS.  0  . MILK THISTLE PLUS PO Take by mouth as directed.    . Multiple Vitamin (MULTIVITAMIN) tablet Take 1 tablet by mouth daily.     No current facility-administered medications on file prior to visit.    Allergies  Allergen Reactions  . Clarithromycin    Social History   Social History  . Marital status: Married    Spouse name: N/A  . Number of children: N/A  . Years of education: N/A   Occupational History  . Not on file.   Social History Main Topics  . Smoking status: Never Smoker  . Smokeless tobacco: Never Used  . Alcohol use 0.0 oz/week     Comment: WINE - rare  . Drug use: No  . Sexual activity: Yes    Birth control/ protection: None, Other-see comments     Comment: PATIENT'S HUSBAND WITH VASECTOMY   Other Topics Concern  . Not on file   Social History Narrative  . No narrative on file      Review of Systems  HENT: Positive for hearing loss. Negative for congestion, dental problem, drooling, ear discharge, ear pain, facial swelling, nosebleeds, postnasal drip, rhinorrhea, sinus pain, sinus pressure, sneezing, tinnitus and trouble swallowing.   All other systems reviewed and are negative.      Objective:   Physical Exam  Constitutional: She appears well-developed and well-nourished. No distress.  HENT:  Nose: Nose normal.  Mouth/Throat: Oropharynx is clear and moist.  Eyes: Conjunctivae are normal.  Neck: Neck supple.  Cardiovascular: Normal rate, regular rhythm and normal heart sounds.   Pulmonary/Chest: Effort normal and breath sounds normal. No stridor. No respiratory distress. She has no wheezes. She has no rales.    Musculoskeletal:       Cervical back: She exhibits tenderness and pain. She exhibits normal range of motion, no bony tenderness, no swelling, no edema, no deformity, no spasm and normal pulse.  Lymphadenopathy:    She has no cervical adenopathy.  Skin: She is not diaphoretic.  Vitals reviewed.         Assessment & Plan:  Neck pain on left side - Plan: DG Cervical Spine Complete, predniSONE (DELTASONE) 20 MG tablet  I suspect that the patient has a bulging disc likely at the level of C3-C4 that could be impinging on the left nerve root causing pain deep within her neck. Try prednisone taper pack. Meanwhile obtain x-ray of the cervical spine. If pain persists an x-ray is unrevealing, would proceed with an MRI of the cervical spine to evaluate further. There are no palpable lymph nodes in that area. There is no palpable mass. I am unable to worsen the pain by palpation in fact it makes the pain slightly better. She has no other worrisome symptoms such as stridor, dysphagia,

## 2016-08-11 ENCOUNTER — Ambulatory Visit
Admission: RE | Admit: 2016-08-11 | Discharge: 2016-08-11 | Disposition: A | Discharge status: Home / Self Care | Payer: BC Managed Care – PPO | Source: Ambulatory Visit | Attending: Family Medicine | Admitting: Family Medicine

## 2016-08-11 ENCOUNTER — Encounter: Payer: Self-pay | Admitting: Family Medicine

## 2016-08-11 DIAGNOSIS — M542 Cervicalgia: Secondary | ICD-10-CM

## 2016-08-14 DIAGNOSIS — M542 Cervicalgia: Secondary | ICD-10-CM | POA: Insufficient documentation

## 2016-08-14 DIAGNOSIS — J358 Other chronic diseases of tonsils and adenoids: Secondary | ICD-10-CM | POA: Insufficient documentation

## 2016-08-15 ENCOUNTER — Other Ambulatory Visit: Payer: Self-pay | Admitting: Family Medicine

## 2016-08-15 DIAGNOSIS — M5030 Other cervical disc degeneration, unspecified cervical region: Secondary | ICD-10-CM

## 2016-08-15 DIAGNOSIS — M5020 Other cervical disc displacement, unspecified cervical region: Secondary | ICD-10-CM

## 2016-08-18 ENCOUNTER — Other Ambulatory Visit: Payer: Self-pay | Admitting: Family Medicine

## 2016-08-18 DIAGNOSIS — M542 Cervicalgia: Secondary | ICD-10-CM

## 2016-08-25 ENCOUNTER — Telehealth: Payer: Self-pay | Admitting: *Deleted

## 2016-08-25 ENCOUNTER — Ambulatory Visit: Payer: BC Managed Care – PPO

## 2016-08-25 NOTE — Telephone Encounter (Signed)
Pt was scheduled for MRI for 6/18 but cancelled appt because [employer] said it was not authorized. Then they sent a fax with an authorization number but because appt had been cancelled this made this authorization null and void. Pt has appt with an orthopedic Dr. Rodell Perna) for 7/18 so we will wait and see if he wants any imaging and let him order that if need be.

## 2016-09-24 ENCOUNTER — Ambulatory Visit (INDEPENDENT_AMBULATORY_CARE_PROVIDER_SITE_OTHER): Payer: BC Managed Care – PPO | Admitting: Orthopaedic Surgery

## 2016-09-24 ENCOUNTER — Encounter (INDEPENDENT_AMBULATORY_CARE_PROVIDER_SITE_OTHER): Payer: Self-pay | Admitting: Orthopaedic Surgery

## 2016-09-24 VITALS — BP 161/86 | HR 68 | Ht 62.0 in | Wt 170.0 lb

## 2016-09-24 DIAGNOSIS — M5020 Other cervical disc displacement, unspecified cervical region: Secondary | ICD-10-CM | POA: Diagnosis not present

## 2016-09-24 DIAGNOSIS — M79672 Pain in left foot: Secondary | ICD-10-CM

## 2016-09-24 NOTE — Addendum Note (Signed)
Addended by: Meyer Cory on: 09/24/2016 11:54 AM   Modules accepted: Orders

## 2016-09-24 NOTE — Progress Notes (Signed)
Office Visit Note/orthopedic consultation   Patient: Kristin Whitney           Date of Birth: 1960-09-12           MRN: [id number] Visit Date: 09/24/2016              Requested by: Susy Frizzle, MD [address] [place], [place] [address] PCP: Susy Frizzle, MD   Assessment & Plan: Visit Diagnoses:  1. Protrusion of cervical intervertebral disc   2. Pain in left foot     Plan: We will set her up for some a home cervical traction that she can use for her neck and also schedule for some physical therapy evaluation and treatment for cervical spondylosis with disc protrusion at C6-7. Previous MRI scan was reviewed with her from 47. She's having no problems with her left foot chronically in an office return in 4 weeks we'll obtain three-view x-rays of her left foot for evaluation. Thank you for the opportunity to see in consultation. If the therapy and home traction is not successful in slowing down her symptoms and will proceed with a repeat MRI scan of the cervical spine.  Follow-Up Instructions: Return in about 4 weeks (around 10/22/2016).   Orders:  No orders of the defined types were placed in this encounter.  No orders of the defined types were placed in this encounter.     Procedures: No procedures performed   Clinical Data: No additional findings.   Subjective: Chief Complaint  Patient presents with  . Neck - Pain    HPI 56 year old female with neck pain that radiates into her shoulders been present for more than 3 years. She states she fell on some steps several years ago hit the left side of her head on door frame head CT scan was negative she is also fall off a horse about 10 years ago she gets some relief with ibuprofen she has aching in her neck difficulty rotating her neck sometimes has to turn her whole body when she backs a car. She's had some popping in her left TMJ joint when she eats but actually doesn't have any pain with this. No popping  she's eating. She denies bowel or bladder symptoms no myelopathic symptoms. She's been active she works for for the [employer]. She denies bowel or bladder symptoms associated. No  chills or fever.  Review of Systems 14 point review of systems obtained. positive for history of anemia, migraines, left TMJ syndrome, hypertension, C6-7 central disc protrusion 2015, perimenopausal symptoms, otherwise negative as it pertains history of present illness.   Objective: Vital Signs: BP (!) 161/86   Pulse 68   Ht 5\' 2"  (1.575 m)   Wt 170 lb (77.1 kg)   BMI 31.09 kg/m   Physical Exam  Constitutional: She is oriented to person, place, and time. She appears well-developed.  HENT:  Head: Normocephalic.  Right Ear: External ear normal.  Left Ear: External ear normal.  Eyes: Pupils are equal, round, and reactive to light.  Neck: No tracheal deviation present. No thyromegaly present.  Cardiovascular: Normal rate.   Pulmonary/Chest: Effort normal.  Abdominal: Soft.  Musculoskeletal:  Patient's mild increased discomfort cervical compression some improvement with distraction. Minimal brachioplexus tenderness right and left upper extremities are 1+ and symmetrical no isolated motor weakness. Negative shoulder impingement right and left. No synovitis of the wrist or fingers. She has a good grip strength no motor deficit in upper Shoney's  normal heel toe ambulation. She has some pain in her left foot or the lateral aspect third fourth fifth toes with ankle dorsiflexion. Tenderness over the tarsometatarsal joints. Straight leg raising name Popp to compression test. She does have sinus tenderness on the left side on minimal the right no trochanteric bursal tenderness normal hip internal/external rotation. Knees reach full extension.  Neurological: She is alert and oriented to person, place, and time.  Skin: Skin is warm and dry.  Psychiatric: She has a normal mood and affect. Her behavior is  normal.    Ortho Exam  Specialty Comments:  No specialty comments available.  Imaging: No results found.   PMFS History: Patient Active Problem List   Diagnosis Date Noted  . Irregular menstrual cycle 04/28/2016  . History of vitamin D deficiency 12/05/2014  . Perimenopause 12/05/2014  . History of anemia 06/07/2012  . Tiredness 06/07/2012  . Menstrual migraine 06/02/2011  . HTN (hypertension) 05/28/2011   Past Medical History:  Diagnosis Date  . ADHD (attention deficit hyperactivity disorder)   . Anemia   . Depression   . Fibrocystic breast changes    left    Family History  Problem Relation Age of Onset  . Hyperlipidemia Mother   . Alzheimer's disease Mother   . Breast cancer Maternal Aunt   . Breast cancer Maternal Aunt   . Stroke Father   . Alcohol abuse Father     Past Surgical History:  Procedure Laterality Date  . GYNECOLOGIC CRYOSURGERY    . NASAL SEPTUM SURGERY  1994  . PELVIC LAPAROSCOPY  12/09/2008   PELVIC WASHINGS, EXCISION OF TORSED LEFT PARATUBULAR CYST. ROLLERBALL ENDOMETRIAL ABLATION   Social History   Occupational History  . Not on file.   Social History Main Topics  . Smoking status: Never Smoker  . Smokeless tobacco: Never Used  . Alcohol use 0.0 oz/week     Comment: WINE - rare  . Drug use: No  . Sexual activity: Yes    Birth control/ protection: None, Other-see comments     Comment: PATIENT'S HUSBAND WITH VASECTOMY

## 2016-10-14 ENCOUNTER — Encounter: Payer: Self-pay | Admitting: Physical Therapy

## 2016-10-14 ENCOUNTER — Ambulatory Visit: Payer: BC Managed Care – PPO | Attending: Family Medicine | Admitting: Physical Therapy

## 2016-10-14 DIAGNOSIS — M6281 Muscle weakness (generalized): Secondary | ICD-10-CM | POA: Insufficient documentation

## 2016-10-14 DIAGNOSIS — M542 Cervicalgia: Secondary | ICD-10-CM | POA: Diagnosis present

## 2016-10-14 DIAGNOSIS — M62838 Other muscle spasm: Secondary | ICD-10-CM | POA: Diagnosis present

## 2016-10-15 ENCOUNTER — Encounter: Payer: Self-pay | Admitting: Physical Therapy

## 2016-10-15 NOTE — Therapy (Signed)
[place], [place], [address] Phone: [phone]   Fax:  [phone]  Physical Therapy Evaluation  Patient Details  Name: Kristin Whitney MRN: [id number] Date of Birth: 05/19/1960 Referring Provider: Dr Elta Guadeloupe yates   Encounter Date: 10/14/2016      PT End of Session - 10/15/16 1430    Visit Number 1   Number of Visits 16   Date for PT Re-Evaluation 12/10/16   Authorization Type bcbs    PT Start Time 1634   PT Stop Time 1720   PT Time Calculation (min) 46 min   Activity Tolerance Patient tolerated treatment well   Behavior During Therapy [facility] for tasks assessed/performed      Past Medical History:  Diagnosis Date  . ADHD (attention deficit hyperactivity disorder)   . Anemia   . Depression   . Fibrocystic breast changes    left    Past Surgical History:  Procedure Laterality Date  . GYNECOLOGIC CRYOSURGERY    . NASAL SEPTUM SURGERY  1994  . PELVIC LAPAROSCOPY  12/09/2008   PELVIC WASHINGS, EXCISION OF TORSED LEFT PARATUBULAR CYST. ROLLERBALL ENDOMETRIAL ABLATION    There were no vitals filed for this visit.       Subjective Assessment - 10/15/16 1408    Subjective Patient was thrown off a horse about three years ago. She reported significant pain after that with numbness inot her hands. The numbness imporved but the pain never did. She has continuesd to have cervical pian that radiates along her lower jaw. The MD also feels she may have TMJ    Limitations Walking;Standing   Diagnostic tests Patient had MRI in 2015: C6 mild disc buldge with nerve compression.    Patient Stated Goals to have less pain and more motionin her neck    Currently in Pain? Yes   Pain Score 5    Pain Location Neck   Pain Orientation Left  cn be on the right as well   Pain Descriptors / Indicators Aching   Pain Type Chronic pain   Pain Onset More than a month ago   Aggravating Factors  turning her head, sleeping     Pain Relieving Factors rest,    Effect of Pain on Daily Activities difficulty turning her head when she is driving.             [facility] PT Assessment - 10/15/16 0001      Assessment   Medical Diagnosis Cervicalgia    Referring Provider Dr Elta Guadeloupe yates    Onset Date/Surgical Date --  3 years prior    Hand Dominance Right   Next MD Visit none scheduled    Prior Therapy for her foot but has been dry needled in her neck which has helped a bit.      Precautions   Precautions None     Restrictions   Weight Bearing Restrictions No     Balance Screen   Has the patient fallen in the past 6 months No   Has the patient had a decrease in activity level because of a fear of falling?  No   Is the patient reluctant to leave their home because of a fear of falling?  No     Home Environment   Additional Comments Nothing significant      Prior Function   Level of Independence Independent   Vocation Full time employment   Surveyor, quantity: long hours at the computer  Leisure rides horses      Cognition   Overall Cognitive Status Within Functional Limits for tasks assessed   Attention Focused   Focused Attention Appears intact   Memory Appears intact   Awareness Appears intact   Problem Solving Appears intact     Observation/Other Assessments   Focus on Therapeutic Outcomes (FOTO)  Not given      Sensation   Light Touch Appears Intact   Additional Comments No pain radiating at this time      Coordination   Gross Motor Movements are Fluid and Coordinated Yes   Fine Motor Movements are Fluid and Coordinated Yes     ROM / Strength   AROM / PROM / Strength AROM;PROM;Strength     AROM   Cervical Flexion 15   Cervical Extension 12   Cervical - Right Side Bend 10   Cervical - Left Side Bend 10   Cervical - Right Rotation 38   Cervical - Left Rotation 45     Strength   Overall Strength Comments 4+/5 gross bilateral upper extremity strength    Right  Shoulder Flexion 4+/5   Right Shoulder ABduction 4+/5   Left Shoulder Flexion 4+/5   Left Shoulder ABduction 4+/5     Palpation   Palpation comment Spasming onf bilateral upper traps; Spasming of left sided cervical paraspinals      Special Tests    Special Tests --  left spurlings (+) compression (-)             Objective measurements completed on examination: See above findings.          [facility] PT Treatment/Exercise - 10/15/16 0001      Neck Exercises: Seated   Other Seated Exercise Scpaular retraction 2x10 with min cuing for tehcnique; cervical rotation 2-3x with cuing not to go through her pain.    Other Seated Exercise reviewed trigger point release with tennis ball and shepars hook.      Manual Therapy   Manual Therapy Soft tissue mobilization;Manual Traction   Soft tissue mobilization trigger point release to upper trap   Manual Traction to upper trap to reduce inflammation      Neck Exercises: Stretches   Levator Stretch Limitations 2x15 seconds with mod cuing for technique                 PT Education - 10/15/16 1429    Education provided Yes   Education Details symptom managemnt, HEP;    Person(s) Educated Patient   Methods Explanation;Demonstration;Tactile cues;Verbal cues   Comprehension Verbalized understanding;Returned demonstration;Verbal cues required;Tactile cues required          PT Short Term Goals - 10/15/16 1727      PT SHORT TERM GOAL #1   Title Patient will increase bilateral cervical rotation to 55 degrees    Time 4   Period Weeks   Status New     PT SHORT TERM GOAL #2   Title Patient will report no radicular pain into the left side of her jaw and neck   Time 4   Period Weeks   Status New     PT SHORT TERM GOAL #3   Title Patient will demsotrate 5/5 gross bilateral shoulder strength    Time 4   Period Weeks   Status New     PT SHORT TERM GOAL #4   Title Patient will be independent with HEP    Time 4    Period Weeks  Status New           PT Long Term Goals - 10/15/16 1729      PT LONG TERM GOAL #1   Title Patient will improve bilateral cervical rotation to 65 degrees in order to increase safety when driving.    Time 8   Period Weeks   Status New     PT LONG TERM GOAL #2   Title Patient will sleep through the night without pain    Time 8   Period Weeks   Status New     PT LONG TERM GOAL #3   Title Patient will be independent with inital HEP    Time 8   Period Weeks   Status New                Plan - 10/15/16 1713    Clinical Impression Statement Patient is a 56 year old female who presents with cerivcal spine pain which is worse on the left and radiates into the left lower jaw line. Shealso has a history of TMJ. Her MRI form 2015 shows a C6 disc buldge. She has not had any recent imaging. She has limited cervical mobility with all movements. She has spasming of her upper trap. The patient has alreayd had some success with dry neelding. She would benefit from skilled therapy to improve overall mobility and to decrease pain. She was seen for a low complexity evaluation.    History and Personal Factors relevant to plan of care: Nothing significant    Clinical Presentation Evolving   Clinical Presentation due to: flut   Clinical Decision Making Low   Rehab Potential Good   PT Frequency 2x / week   PT Duration 8 weeks   PT Treatment/Interventions ADLs/Self Care Home Management;Cryotherapy;Electrical Stimulation;Iontophoresis 4mg /ml Dexamethasone;Gait training;Stair training;Moist Heat;Therapeutic activities;Therapeutic exercise;Neuromuscular re-education;Patient/family education;Manual techniques;Dry needling;Splinting;Taping;Traction;Ultrasound   PT Next Visit Plan consider dry needling to trigger points. Continue with soft tisssue mobilization, continue with manual traction, Add scpa retraction and shoulder extension, Consdier muslligan slef mobilization   PT Home  Exercise Plan cervical rotation, scap retraction; self trigger point release,    Consulted and Agree with Plan of Care Patient      Patient will benefit from skilled therapeutic intervention in order to improve the following deficits and impairments:  Abnormal gait, Pain, Decreased strength, Decreased mobility, Decreased activity tolerance, Increased muscle spasms, Decreased range of motion  Visit Diagnosis: Cervicalgia - Plan: PT plan of care cert/re-cert  Muscle weakness (generalized) - Plan: PT plan of care cert/re-cert  Other muscle spasm - Plan: PT plan of care cert/re-cert     Problem List Patient Active Problem List   Diagnosis Date Noted  . Irregular menstrual cycle 04/28/2016  . History of vitamin D deficiency 12/05/2014  . Perimenopause 12/05/2014  . History of anemia 06/07/2012  . Tiredness 06/07/2012  . Menstrual migraine 06/02/2011  . HTN (hypertension) 05/28/2011    Carney Living PT DPT  10/15/2016, 5:39 PM  [facility] [address] [place], [place], [address] Phone: [phone]   Fax:  [phone]  Name: Kristin Whitney MRN: [id number] Date of Birth: 1960/06/18

## 2016-10-28 ENCOUNTER — Ambulatory Visit (INDEPENDENT_AMBULATORY_CARE_PROVIDER_SITE_OTHER): Payer: BC Managed Care – PPO | Admitting: Orthopaedic Surgery

## 2016-11-03 ENCOUNTER — Ambulatory Visit: Payer: BC Managed Care – PPO | Admitting: Physical Therapy

## 2016-11-03 ENCOUNTER — Telehealth: Payer: Self-pay | Admitting: Physical Therapy

## 2016-11-03 NOTE — Telephone Encounter (Signed)
Contacted patient regarding missed appointment. Phone went to voice mail but voice mailbox was full. Will attempt to contact patient again at a later date.

## 2016-11-12 ENCOUNTER — Ambulatory Visit (INDEPENDENT_AMBULATORY_CARE_PROVIDER_SITE_OTHER): Payer: BC Managed Care – PPO | Admitting: Surgery

## 2016-11-12 ENCOUNTER — Ambulatory Visit (INDEPENDENT_AMBULATORY_CARE_PROVIDER_SITE_OTHER): Payer: BC Managed Care – PPO | Admitting: Orthopaedic Surgery

## 2016-11-12 ENCOUNTER — Ambulatory Visit: Payer: BC Managed Care – PPO | Attending: Family Medicine | Admitting: Physical Therapy

## 2016-11-12 ENCOUNTER — Ambulatory Visit (INDEPENDENT_AMBULATORY_CARE_PROVIDER_SITE_OTHER): Payer: BC Managed Care – PPO

## 2016-11-12 DIAGNOSIS — M5412 Radiculopathy, cervical region: Secondary | ICD-10-CM

## 2016-11-12 DIAGNOSIS — M25572 Pain in left ankle and joints of left foot: Secondary | ICD-10-CM

## 2016-11-12 DIAGNOSIS — M6281 Muscle weakness (generalized): Secondary | ICD-10-CM | POA: Insufficient documentation

## 2016-11-12 DIAGNOSIS — M79672 Pain in left foot: Secondary | ICD-10-CM | POA: Diagnosis not present

## 2016-11-12 DIAGNOSIS — M542 Cervicalgia: Secondary | ICD-10-CM

## 2016-11-12 DIAGNOSIS — M62838 Other muscle spasm: Secondary | ICD-10-CM

## 2016-11-12 NOTE — Progress Notes (Signed)
Office Visit Note   Patient: Kristin Whitney           Date of Birth: 1960/08/13           MRN: [id number] Visit Date: 11/12/2016              Requested by: Susy Frizzle, MD [address] [place], [place] [address] PCP: Susy Frizzle, MD   Assessment & Plan: Visit Diagnoses:  1. Pain in left foot   2. Cervicalgia   3. Radiculopathy, cervical region   4. Pain in left ankle and joints of left foot     Plan: With patient's ongoing left-sided neck pain we recommend getting a cervical spine MRI. Failed conservative treatment at this point. Patient also continues to describe having tinnitus in the left ear along with left anterior neck pain. She's seen Dr. Constance Holster ENT and he did not find an ear issue that is causing the problem. Recommend patient speak to her primary care physician and they can decide if further studies/workup is indicated. Question imaging of the brain and/or carotid ultrasound.  Follow-up with Dr. Lorin Mercy after service spine MRI to review results and discuss further treatment options. At follow-up visit we will also get left ankle x-ray and we may consider intra-articular ankle Marcaine/Depo-Medrol injection at that time.  Follow-Up Instructions: Return in about 2 weeks (around 11/26/2016) for Review cervical spine MRI with Dr. Lorin Mercy and x-ray left ankle.   Orders:  Orders Placed This Encounter  Procedures  . XR Foot Complete Left  . MR Cervical Spine w/o contrast   No orders of the defined types were placed in this encounter.     Procedures: No procedures performed   Clinical Data: No additional findings.   Subjective: Chief Complaint  Patient presents with  . Neck - Pain  . Left Foot - Pain    HPI Patient returns for evaluation of left-sided neck pain and left foot and ankle pain. Neck pain continues to be an ongoing issue. Failed conservative treatment at this point. Pain extends down into the left trapezius muscle. Also continues to  describe having left anterior neck pain as well. Patient states that to help relieve some of the anterior pain she demonstrates putting pressure around her left carotid artery.  No right-sided symptoms. Patient states that she had an injury to her left ankle about a year ago and since that time she's continued to have left lateral ankle and foot symptoms. Describes pain and some catching left lateral ankle with eversion maneuvers. Pain with ambulating at times. Has has some swelling. Patient also describes ongoing tinnitus left ear. Dr. Constance Holster ENT has evaluated this. Patient denies feeling lightheaded or dizzy. Review of Systems Admits tinnitus. No current cardiac pulmonary GI GU issues.  Objective: Vital Signs: There were no vitals taken for this visit.  Physical Exam  Constitutional: No distress.  HENT:  Head: Normocephalic and atraumatic.  Eyes: Pupils are equal, round, and reactive to light.  Neck:  Left brachial plexus trapezius tenderness. Carotid pulses intact.    Ortho Exam  Specialty Comments:  No specialty comments available.  Imaging: No results found.   PMFS History: Patient Active Problem List   Diagnosis Date Noted  . Irregular menstrual cycle 04/28/2016  . History of vitamin D deficiency 12/05/2014  . Perimenopause 12/05/2014  . History of anemia 06/07/2012  . Tiredness 06/07/2012  . Menstrual migraine 06/02/2011  . HTN (hypertension) 05/28/2011   Past Medical History:  Diagnosis Date  . ADHD (attention deficit hyperactivity disorder)   . Anemia   . Depression   . Fibrocystic breast changes    left    Family History  Problem Relation Age of Onset  . Hyperlipidemia Mother   . Alzheimer's disease Mother   . Breast cancer Maternal Aunt   . Breast cancer Maternal Aunt   . Stroke Father   . Alcohol abuse Father     Past Surgical History:  Procedure Laterality Date  . GYNECOLOGIC CRYOSURGERY    . NASAL SEPTUM SURGERY  1994  . PELVIC LAPAROSCOPY   12/09/2008   PELVIC WASHINGS, EXCISION OF TORSED LEFT PARATUBULAR CYST. ROLLERBALL ENDOMETRIAL ABLATION   Social History   Occupational History  . Not on file.   Social History Main Topics  . Smoking status: Never Smoker  . Smokeless tobacco: Never Used  . Alcohol use 0.0 oz/week     Comment: WINE - rare  . Drug use: No  . Sexual activity: Yes    Birth control/ protection: None, Other-see comments     Comment: PATIENT'S HUSBAND WITH VASECTOMY

## 2016-11-13 NOTE — Therapy (Addendum)
[facility] [place], [place], [address] Phone: [phone]   Fax:  [phone]  Physical Therapy Treatment/ Discharge   Patient Details  Name: Kristin Whitney MRN: [id number] Date of Birth: July 08, 1960 Referring Provider: Dr Elta Guadeloupe yates   Encounter Date: 11/12/2016      PT End of Session - 11/13/16 1551    Visit Number 2   Number of Visits 16   Date for PT Re-Evaluation 12/10/16   Authorization Type BC[employer]    PT Start Time 1632   PT Stop Time 7169   PT Time Calculation (min) 43 min   Activity Tolerance Patient tolerated treatment well   Behavior During Therapy [facility] for tasks assessed/performed      Past Medical History:  Diagnosis Date  . ADHD (attention deficit hyperactivity disorder)   . Anemia   . Depression   . Fibrocystic breast changes    left    Past Surgical History:  Procedure Laterality Date  . GYNECOLOGIC CRYOSURGERY    . NASAL SEPTUM SURGERY  1994  . PELVIC LAPAROSCOPY  12/09/2008   PELVIC WASHINGS, EXCISION OF TORSED LEFT PARATUBULAR CYST. ROLLERBALL ENDOMETRIAL ABLATION    There were no vitals filed for this visit.      Subjective Assessment - 11/13/16 0829    Subjective Patient reports her neck is feeling better. She has been perfroming self soft tissue work on herself without much pain. She continues to have pain running underneath her jaw. The MD is having some concern that ist may be her carotid or a lymph node. She had an incedent last week where she lost her hearing in her left ear. It did come back.    Limitations Walking;Standing   Diagnostic tests Patient had MRI in 2015: C6 mild disc buldge with nerve compression.    Patient Stated Goals to have less pain and more motionin her neck    Currently in Pain? Yes   Pain Score 3    Pain Location Neck   Pain Orientation Left   Pain Descriptors / Indicators Aching   Pain Type Chronic pain   Pain Radiating Towards running under her jaw     Pain Onset More than a month ago   Aggravating Factors  turning her head    Pain Relieving Factors rest   Effect of Pain on Daily Activities difficulty turning her head when driving                          [facility] Adult PT Treatment/Exercise - 11/13/16 0001      Neck Exercises: Standing   Other Standing Exercises scapretraction red 2x10 shoulder extension 2x10 red with education for home program.      Neck Exercises: Supine   Other Supine Exercise supine ER 2x10 red; supine horizontal ABD 2x10;      Manual Therapy   Manual Therapy Soft tissue mobilization;Manual Traction   Soft tissue mobilization trigger point release to upper trap   Manual Traction light manual traction and sub occipital relase.                 PT Education - 11/13/16 1550    Education provided Yes   Education Details updated HEP; reviewed the improtance of postural correction    Person(s) Educated Patient   Methods Explanation;Demonstration;Tactile cues;Verbal cues   Comprehension Verbalized understanding;Returned demonstration;Verbal cues required;Tactile cues required          PT Short Term  Goals - 10/15/16 1727      PT SHORT TERM GOAL #1   Title Patient will increase bilateral cervical rotation to 55 degrees    Time 4   Period Weeks   Status New     PT SHORT TERM GOAL #2   Title Patient will report no radicular pain into the left side of her jaw and neck   Time 4   Period Weeks   Status New     PT SHORT TERM GOAL #3   Title Patient will demsotrate 5/5 gross bilateral shoulder strength    Time 4   Period Weeks   Status New     PT SHORT TERM GOAL #4   Title Patient will be independent with HEP    Time 4   Period Weeks   Status New           PT Long Term Goals - 10/15/16 1729      PT LONG TERM GOAL #1   Title Patient will improve bilateral cervical rotation to 65 degrees in order to increase safety when driving.    Time 8   Period Weeks   Status New      PT LONG TERM GOAL #2   Title Patient will sleep through the night without pain    Time 8   Period Weeks   Status New     PT LONG TERM GOAL #3   Title Patient will be independent with inital HEP    Time 8   Period Weeks   Status New               Plan - 11/13/16 1559    Clinical Impression Statement Patients neck feels better. Therapy could not find any trigger points to needle. She had minor tightness in her upper trap. She had improved movement. She continues to have pain in her ear though that goes underneath her jaw. She hopes to have a doppler or an MRI to look at her lymph nodes and coratid artery.    Clinical Presentation Evolving   Clinical Decision Making Low   Rehab Potential Good   PT Frequency 2x / week   PT Duration 8 weeks   PT Treatment/Interventions ADLs/Self Care Home Management;Cryotherapy;Electrical Stimulation;Iontophoresis 44m/ml Dexamethasone;Gait training;Stair training;Moist Heat;Therapeutic activities;Therapeutic exercise;Neuromuscular re-education;Patient/family education;Manual techniques;Dry needling;Splinting;Taping;Traction;Ultrasound   PT Next Visit Plan consider dry needling to trigger points. Continue with soft tisssue mobilization, continue with manual traction, Add scpa retraction and shoulder extension, Consdier muslligan slef mobilization   PT Home Exercise Plan cervical rotation, scap retraction; self trigger point release,    Consulted and Agree with Plan of Care Patient      Patient will benefit from skilled therapeutic intervention in order to improve the following deficits and impairments:  Abnormal gait, Pain, Decreased strength, Decreased mobility, Decreased activity tolerance, Increased muscle spasms, Decreased range of motion  Visit Diagnosis: Cervicalgia  Muscle weakness (generalized)  Other muscle spasm  PHYSICAL THERAPY DISCHARGE SUMMARY  Visits from Start of Care: 2  Current functional level related to goals /  functional outcomes: Returned to MD    Remaining deficits: Unknown    Education / Equipment: Unknown  Plan: Patient agrees to discharge.  Patient goals were not met. Patient is being discharged due to a change in medical status.  ?????       Problem List Patient Active Problem List   Diagnosis Date Noted  . Irregular menstrual cycle 04/28/2016  . History of vitamin D deficiency 12/05/2014  .  Perimenopause 12/05/2014  . History of anemia 06/07/2012  . Tiredness 06/07/2012  . Menstrual migraine 06/02/2011  . HTN (hypertension) 05/28/2011    Carney Living PT DPT  11/13/2016, 4:10 PM  [facility] [address] [place], [place], [address] Phone: [phone]   Fax:  [phone]  Name: KENZLIE DISCH MRN: [id number] Date of Birth: 1960-07-05

## 2016-11-17 ENCOUNTER — Ambulatory Visit: Payer: BC Managed Care – PPO | Admitting: Physical Therapy

## 2016-11-18 ENCOUNTER — Encounter: Payer: Self-pay | Admitting: Family Medicine

## 2016-11-18 ENCOUNTER — Other Ambulatory Visit: Payer: Self-pay | Admitting: Family Medicine

## 2016-11-18 DIAGNOSIS — M542 Cervicalgia: Secondary | ICD-10-CM

## 2016-11-18 DIAGNOSIS — R6884 Jaw pain: Secondary | ICD-10-CM

## 2016-11-19 ENCOUNTER — Other Ambulatory Visit: Payer: Self-pay | Admitting: Family Medicine

## 2016-11-19 ENCOUNTER — Ambulatory Visit: Payer: BC Managed Care – PPO | Admitting: Physical Therapy

## 2016-11-19 DIAGNOSIS — M542 Cervicalgia: Secondary | ICD-10-CM

## 2016-11-19 DIAGNOSIS — R6884 Jaw pain: Secondary | ICD-10-CM

## 2016-11-24 ENCOUNTER — Encounter: Payer: BC Managed Care – PPO | Admitting: Physical Therapy

## 2016-11-27 ENCOUNTER — Encounter: Payer: BC Managed Care – PPO | Admitting: Physical Therapy

## 2016-11-27 ENCOUNTER — Ambulatory Visit
Admission: RE | Admit: 2016-11-27 | Discharge: 2016-11-27 | Disposition: A | Discharge status: Home / Self Care | Payer: BC Managed Care – PPO | Source: Ambulatory Visit | Attending: Surgery | Admitting: Surgery

## 2016-11-27 DIAGNOSIS — M5412 Radiculopathy, cervical region: Secondary | ICD-10-CM

## 2016-11-28 ENCOUNTER — Ambulatory Visit (HOSPITAL_COMMUNITY)
Admission: RE | Admit: 2016-11-28 | Discharge: 2016-11-28 | Disposition: A | Discharge status: Home / Self Care | Payer: BC Managed Care – PPO | Source: Ambulatory Visit | Attending: Cardiovascular Disease | Admitting: Cardiovascular Disease

## 2016-11-28 DIAGNOSIS — R6884 Jaw pain: Secondary | ICD-10-CM | POA: Diagnosis not present

## 2016-11-28 DIAGNOSIS — M542 Cervicalgia: Secondary | ICD-10-CM | POA: Insufficient documentation

## 2016-12-01 ENCOUNTER — Encounter: Payer: BC Managed Care – PPO | Admitting: Physical Therapy

## 2016-12-02 ENCOUNTER — Other Ambulatory Visit: Payer: Self-pay | Admitting: Family Medicine

## 2016-12-02 DIAGNOSIS — R221 Localized swelling, mass and lump, neck: Secondary | ICD-10-CM

## 2016-12-03 ENCOUNTER — Ambulatory Visit (INDEPENDENT_AMBULATORY_CARE_PROVIDER_SITE_OTHER): Payer: BC Managed Care – PPO

## 2016-12-03 ENCOUNTER — Ambulatory Visit (INDEPENDENT_AMBULATORY_CARE_PROVIDER_SITE_OTHER): Payer: BC Managed Care – PPO | Admitting: Orthopaedic Surgery

## 2016-12-03 ENCOUNTER — Encounter: Payer: Self-pay | Admitting: Family Medicine

## 2016-12-03 ENCOUNTER — Encounter (INDEPENDENT_AMBULATORY_CARE_PROVIDER_SITE_OTHER): Payer: Self-pay | Admitting: Orthopaedic Surgery

## 2016-12-03 VITALS — BP 143/85 | HR 71 | Ht 62.0 in | Wt 175.0 lb

## 2016-12-03 DIAGNOSIS — M5020 Other cervical disc displacement, unspecified cervical region: Secondary | ICD-10-CM | POA: Diagnosis not present

## 2016-12-03 DIAGNOSIS — M25572 Pain in left ankle and joints of left foot: Secondary | ICD-10-CM | POA: Diagnosis not present

## 2016-12-03 NOTE — Progress Notes (Signed)
Office Visit Note   Patient: Kristin Whitney           Date of Birth: [birth year]           MRN: [id number] Visit Date: 12/03/2016              Requested by: Susy Frizzle, MD [address] [place], [place] [address] PCP: Susy Frizzle, MD   Assessment & Plan: Visit Diagnoses:  1. Pain in left ankle and joints of left foot   2. Protrusion of cervical intervertebral disc        Plan: Check her back again in 4 weeks. We discussed the possible left C6-7 facet injection for diagnostic and third pubic purposes of her symptoms persist. Recheck 4 weeks.  Follow-Up Instructions: Return in about 4 weeks (around 12/31/2016).   Orders:  Orders Placed This Encounter  Procedures  . XR Ankle Complete Left   No orders of the defined types were placed in this encounter.     Procedures: No procedures performed   Clinical Data: No additional findings.   Subjective: Chief Complaint  Patient presents with  . Neck - Pain, Follow-up  . Left Ankle - Pain, Follow-up    HPI patient returns she's continuing to have some symptoms in the left side of her neck she states the numbness and tingling in her hands gotten better. Still has some problems with left ankle stiffness she had history of ankle sprains in the past. She had a Doppler test done to rule out carotid stenosis in the showed a mass in the muscle on the left side. It was not visualized on the cervical MRI scan. I would assume it would be in the sternocleidomastoid muscle but not mentioned by the report. MRI scan cervical spine did show small disc bulges C6-7 and significant facet fluid on the facet of C6-7 on the left more than right. She got good relief with prednisone short-term. She has a CT soft tissue of the neck coming up to evaluate the mass in the muscle.  Review of Systems review of systems updated and unchanged from last office visit other than as mentioned in history of present illness.   Objective: Vital  Signs: BP (!) 143/85   Pulse 71   Ht 5\' 2"  (1.575 m)   Wt 175 lb (79.4 kg)   BMI 32.01 kg/m   Physical Exam  Constitutional: She is oriented to person, place, and time. She appears well-developed.  HENT:  Head: Normocephalic.  Right Ear: External ear normal.  Left Ear: External ear normal.  Eyes: Pupils are equal, round, and reactive to light.  Neck: No tracheal deviation present. No thyromegaly present.  Cardiovascular: Normal rate.   Pulmonary/Chest: Effort normal.  Abdominal: Soft.  Neurological: She is alert and oriented to person, place, and time.  Skin: Skin is warm and dry.  Psychiatric: She has a normal mood and affect. Her behavior is normal.    Ortho Exam no masses noted in the sternocleidomastoid muscle she does have some brachial plexus tenderness. Some pain with rotation and extension of her neck which is left side lower cervical. No palpable enlarged lymph nodes in the neck or supra clavicular region. Sternocleidomastoid muscle is symmetrical with palpation.  Specialty Comments:  No specialty comments available.  Imaging: Cervical MRI shows small central disc protrusion at C6-7 with bilateral facet effusion at C6-7 worse on the left than right. No significant central stenosis. There is effacement of the anterior  subarachnoid space.    PMFS History: Patient Active Problem List   Diagnosis Date Noted  . Irregular menstrual cycle 04/28/2016  . History of vitamin D deficiency 12/05/2014  . Perimenopause 12/05/2014  . History of anemia 06/07/2012  . Tiredness 06/07/2012  . Menstrual migraine 06/02/2011  . HTN (hypertension) 05/28/2011   Past Medical History:  Diagnosis Date  . ADHD (attention deficit hyperactivity disorder)   . Anemia   . Depression   . Fibrocystic breast changes    left    Family History  Problem Relation Age of Onset  . Hyperlipidemia Mother   . Alzheimer's disease Mother   . Breast cancer Maternal Aunt   . Breast cancer Maternal  Aunt   . Stroke Father   . Alcohol abuse Father     Past Surgical History:  Procedure Laterality Date  . GYNECOLOGIC CRYOSURGERY    . NASAL SEPTUM SURGERY  1994  . PELVIC LAPAROSCOPY  12/09/2008   PELVIC WASHINGS, EXCISION OF TORSED LEFT PARATUBULAR CYST. ROLLERBALL ENDOMETRIAL ABLATION   Social History   Occupational History  . Not on file.   Social History Main Topics  . Smoking status: Never Smoker  . Smokeless tobacco: Never Used  . Alcohol use 0.0 oz/week     Comment: WINE - rare  . Drug use: No  . Sexual activity: Yes    Birth control/ protection: None, Other-see comments     Comment: PATIENT'S HUSBAND WITH VASECTOMY

## 2016-12-04 ENCOUNTER — Ambulatory Visit: Payer: BC Managed Care – PPO | Admitting: Physical Therapy

## 2016-12-04 ENCOUNTER — Telehealth: Payer: Self-pay | Admitting: Family Medicine

## 2016-12-04 ENCOUNTER — Ambulatory Visit
Admission: RE | Admit: 2016-12-04 | Discharge: 2016-12-04 | Disposition: A | Discharge status: Home / Self Care | Payer: BC Managed Care – PPO | Source: Ambulatory Visit | Attending: Family Medicine | Admitting: Family Medicine

## 2016-12-04 DIAGNOSIS — M5432 Sciatica, left side: Secondary | ICD-10-CM

## 2016-12-04 DIAGNOSIS — R221 Localized swelling, mass and lump, neck: Secondary | ICD-10-CM

## 2016-12-04 MED ORDER — IOPAMIDOL (ISOVUE-300) INJECTION 61%
75.0000 mL | Freq: Once | INTRAVENOUS | Status: AC | PRN
  Administered 2016-12-04: 75 mL via INTRAVENOUS

## 2016-12-31 ENCOUNTER — Encounter (INDEPENDENT_AMBULATORY_CARE_PROVIDER_SITE_OTHER): Payer: Self-pay | Admitting: Orthopaedic Surgery

## 2016-12-31 ENCOUNTER — Ambulatory Visit (INDEPENDENT_AMBULATORY_CARE_PROVIDER_SITE_OTHER): Payer: BC Managed Care – PPO

## 2016-12-31 ENCOUNTER — Ambulatory Visit (INDEPENDENT_AMBULATORY_CARE_PROVIDER_SITE_OTHER): Payer: BC Managed Care – PPO | Admitting: Orthopaedic Surgery

## 2016-12-31 VITALS — BP 149/84 | HR 70 | Ht 62.0 in | Wt 175.0 lb

## 2016-12-31 DIAGNOSIS — M79645 Pain in left finger(s): Secondary | ICD-10-CM

## 2016-12-31 DIAGNOSIS — M47812 Spondylosis without myelopathy or radiculopathy, cervical region: Secondary | ICD-10-CM | POA: Diagnosis not present

## 2016-12-31 DIAGNOSIS — S63642A Sprain of metacarpophalangeal joint of left thumb, initial encounter: Secondary | ICD-10-CM

## 2016-12-31 DIAGNOSIS — S5332XA Traumatic rupture of left ulnar collateral ligament, initial encounter: Secondary | ICD-10-CM | POA: Diagnosis not present
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
## 2017-01-01 ENCOUNTER — Telehealth (INDEPENDENT_AMBULATORY_CARE_PROVIDER_SITE_OTHER): Payer: Self-pay | Admitting: Orthopaedic Surgery

## 2017-01-01 NOTE — Telephone Encounter (Signed)
Patient called stating that she did not get the paperwork that she gave you yesterday (Wednesday) at her appointment, she requested that you mail them back to her, she is needing them to file a claim with [employer] so that they will pay her.  CB#[phone].  Thank you.

## 2017-01-02 ENCOUNTER — Ambulatory Visit (INDEPENDENT_AMBULATORY_CARE_PROVIDER_SITE_OTHER): Payer: BC Managed Care – PPO | Admitting: Orthopaedic Surgery

## 2017-01-02 NOTE — Telephone Encounter (Signed)
Put into mail for patient.

## 2017-01-06 ENCOUNTER — Other Ambulatory Visit: Payer: Self-pay | Admitting: Family Medicine

## 2017-01-06 DIAGNOSIS — Z1231 Encounter for screening mammogram for malignant neoplasm of breast: Secondary | ICD-10-CM

## 2017-02-03 ENCOUNTER — Ambulatory Visit
Admission: RE | Admit: 2017-02-03 | Discharge: 2017-02-03 | Disposition: A | Discharge status: Home / Self Care | Payer: BC Managed Care – PPO | Source: Ambulatory Visit | Attending: Family Medicine | Admitting: Family Medicine

## 2017-02-03 ENCOUNTER — Encounter: Payer: Self-pay | Admitting: Radiology

## 2017-02-03 DIAGNOSIS — Z1231 Encounter for screening mammogram for malignant neoplasm of breast: Secondary | ICD-10-CM

## 2017-02-05 ENCOUNTER — Ambulatory Visit (INDEPENDENT_AMBULATORY_CARE_PROVIDER_SITE_OTHER): Payer: BC Managed Care – PPO | Admitting: Family Medicine

## 2017-02-05 DIAGNOSIS — Z23 Encounter for immunization: Secondary | ICD-10-CM

## 2017-02-19 ENCOUNTER — Encounter: Payer: Self-pay | Admitting: Women's Health

## 2017-02-19 ENCOUNTER — Encounter: Payer: BC Managed Care – PPO | Admitting: Obstetrics & Gynecology

## 2017-02-19 ENCOUNTER — Ambulatory Visit: Payer: BC Managed Care – PPO | Admitting: Women's Health

## 2017-02-19 VITALS — BP 124/80 | Ht 62.0 in | Wt 177.0 lb

## 2017-02-19 DIAGNOSIS — Z01419 Encounter for gynecological examination (general) (routine) without abnormal findings: Secondary | ICD-10-CM | POA: Diagnosis not present

## 2017-02-19 NOTE — Patient Instructions (Signed)
Gluten-Free Diet for Celiac Disease, Adult The gluten-free diet includes all foods that do not contain gluten. Gluten is a protein that is found in wheat, rye, barley, and some other grains. Following the gluten-free diet is the only treatment for people with celiac disease. It helps to prevent damage to the intestines and improves or eliminates the symptoms of celiac disease. Following the gluten-free diet requires some planning. It can be challenging at first, but it gets easier with time and practice. There are more gluten-free options available today than ever before. If you need help finding gluten-free foods or if you have questions, talk with your diet and nutrition specialist (registered dietitian) or your health care provider. What do I need to know about a gluten-free diet?  All fruits, vegetables, and meats are safe to eat and do not contain gluten.  When grocery shopping, start by shopping in the produce, meat, and dairy sections. These sections are more likely to contain gluten-free foods. Then move to the aisles that contain packaged foods if you need to.  Read all food labels. Gluten is often added to foods. Always check the ingredient list and look for warnings, such as "may contain gluten."  Talk with your dietitian or health care provider before taking a gluten-free multivitamin or mineral supplement.  Be aware of gluten-free foods having contact with foods that contain gluten (cross-contamination). This can happen at home and with any processed foods. ? Talk with your health care provider or dietitian about how to reduce the risk of cross-contamination in your home. ? If you have questions about how a food is processed, ask the manufacturer. What key words help to identify gluten? Foods that list any of these key words on the label usually contain gluten:  Wheat, flour, enriched flour, bromated flour, white flour, durum flour, graham flour, phosphated flour, self-rising flour,  semolina, farina, barley (malt), rye, and oats.  Starch, dextrin, modified food starch, or cereal.  Thickening, fillers, or emulsifiers.  Malt flavoring, malt extract, or malt syrup.  Hydrolyzed vegetable protein.  In the [place] packaged foods that are gluten-free are required to be labeled "GF." These foods should be easy to identify and are safe to eat. In the [place] food companies are also required to list common food allergens, including wheat, on their labels. Recommended foods Grains  Amaranth, bean flours, 100% buckwheat flour, corn, millet, nut flours or nut meals, GF oats, quinoa, rice, sorghum, teff, rice wafers, pure cornmeal tortillas, popcorn, and hot cereals made from cornmeal. Hominy, rice, wild rice. Some Asian rice noodles or bean noodles. Arrowroot starch, corn bran, corn flour, corn germ, cornmeal, corn starch, potato flour, potato starch flour, and rice bran. Plain, brown, and sweet rice flours. Rice polish, soy flour, and tapioca starch. Vegetables  All plain fresh, frozen, and canned vegetables. Fruits  All plain fresh, frozen, canned, and dried fruits, and 100% fruit juices. Meats and other protein foods  All fresh beef, pork, poultry, fish, seafood, and eggs. Fish canned in water, oil, brine, or vegetable broth. Plain nuts and seeds, peanut butter. Some lunch meat and some frankfurters. Dried beans, dried peas, and lentils. Dairy  Fresh plain, dry, evaporated, or condensed milk. Cream, butter, sour cream, whipping cream, and most yogurts. Unprocessed cheese, most processed cheeses, some cottage cheese, some cream cheeses. Beverages  Coffee, tea, most herbal teas. Carbonated beverages and some root beers. Wine, sake, and distilled spirits, such as gin, vodka, and whiskey. Most hard ciders. Fats and oils  Butter,  margarine, vegetable oil, hydrogenated butter, olive oil, shortening, lard, cream, and some mayonnaise. Some commercial salad dressings. Olives. Sweets  and desserts  Sugar, honey, some syrups, molasses, jelly, and jam. Plain hard candy, marshmallows, and gumdrops. Pure cocoa powder. Plain chocolate. Custard and some pudding mixes. Gelatin desserts, sorbets, frozen ice pops, and sherbet. Cake, cookies, and other desserts prepared with allowed flours. Some commercial ice creams. Cornstarch, tapioca, and rice puddings. Seasoning and other foods  Some canned or frozen soups. Monosodium glutamate (MSG). Cider, rice, and wine vinegar. Baking soda and baking powder. Cream of tartar. Baking and nutritional yeast. Certain soy sauces made without wheat (ask your dietitian about specific brands that are allowed). Nuts, coconut, and chocolate. Salt, pepper, herbs, spices, flavoring extracts, imitation or artificial flavorings, natural flavorings, and food colorings. Some medicines and supplements. Some lip glosses and other cosmetics. Rice syrups. The items listed may not be a complete list. Talk with your dietitian about what dietary choices are best for you. Foods to avoid Grains  Barley, bran, bulgur, couscous, cracked wheat, [place], farro, graham, malt, matzo, semolina, wheat germ, and all wheat and rye cereals including spelt and kamut. Cereals containing malt as a flavoring, such as rice cereal. Noodles, spaghetti, macaroni, most packaged rice mixes, and all mixes containing wheat, rye, barley, or triticale. Vegetables  Most creamed vegetables and most vegetables canned in sauces. Some commercially prepared vegetables and salads. Fruits  Thickened or prepared fruits and some pie fillings. Some fruit snacks and fruit roll-ups. Meats and other protein foods  Any meat or meat alternative containing wheat, rye, barley, or gluten stabilizers. These are often marinated or packaged meats and lunch meats. Bread-containing products, such as Swiss steak, croquettes, meatballs, and meatloaf. Most tuna canned in vegetable broth and turkey with hydrolyzed vegetable  protein (HVP) injected as part of the basting. Seitan. Imitation fish. Eggs in sauces made from ingredients to avoid. Dairy  Commercial chocolate milk drinks and malted milk. Some non-dairy creamers. Any cheese product containing ingredients to avoid. Beverages  Certain cereal beverages. Beer, ale, malted milk, and some root beers. Some hard ciders. Some instant flavored coffees. Some herbal teas made with barley or with barley malt added. Fats and oils  Some commercial salad dressings. Sour cream containing modified food starch. Sweets and desserts  Some toffees. Chocolate-coated nuts (may be rolled in wheat flour) and some commercial candies and candy bars. Most cakes, cookies, donuts, pastries, and other baked goods. Some commercial ice cream. Ice cream cones. Commercially prepared mixes for cakes, cookies, and other desserts. Bread pudding and other puddings thickened with flour. Products containing brown rice syrup made with barley malt enzyme. Desserts and sweets made with malt flavoring. Seasoning and other foods  Some curry powders, some dry seasoning mixes, some gravy extracts, some meat sauces, some ketchups, some prepared mustards, and horseradish. Certain soy sauces. Malt vinegar. Bouillon and bouillon cubes that contain HVP. Some chip dips, and some chewing gum. Yeast extract. Brewer's yeast. Caramel color. Some medicines and supplements. Some lip glosses and other cosmetics. The items listed may not be a complete list. Talk with your dietitian about what dietary choices are best for you. Summary  Gluten is a protein that is found in wheat, rye, barley, and some other grains. The gluten-free diet includes all foods that do not contain gluten.  If you need help finding gluten-free foods or if you have questions, talk with your diet and nutrition specialist (registered dietitian) or your health care provider.  Read   all food labels. Gluten is often added to foods. Always check the  ingredient list and look for warnings, such as "may contain gluten." This information is not intended to replace advice given to you by your health care provider. Make sure you discuss any questions you have with your health care provider. Document Released: 02/24/2005 Document Revised: 12/10/2015 Document Reviewed: 12/10/2015 Elsevier Interactive Patient Education  2018 [place] Maintenance, Female Adopting a healthy lifestyle and getting preventive care can go a long way to promote health and wellness. Talk with your health care provider about what schedule of regular examinations is right for you. This is a good chance for you to check in with your provider about disease prevention and staying healthy. In between checkups, there are plenty of things you can do on your own. Experts have done a lot of research about which lifestyle changes and preventive measures are most likely to keep you healthy. Ask your health care provider for more information. Weight and diet Eat a healthy diet  Be sure to include plenty of vegetables, fruits, low-fat dairy products, and lean protein.  Do not eat a lot of foods high in solid fats, added sugars, or salt.  Get regular exercise. This is one of the most important things you can do for your health. ? Most adults should exercise for at least 150 minutes each week. The exercise should increase your heart rate and make you sweat (moderate-intensity exercise). ? Most adults should also do strengthening exercises at least twice a week. This is in addition to the moderate-intensity exercise.  Maintain a healthy weight  Body mass index (BMI) is a measurement that can be used to identify possible weight problems. It estimates body fat based on height and weight. Your health care provider can help determine your BMI and help you achieve or maintain a healthy weight.  For females 69 years of age and older: ? A BMI below 18.5 is considered  underweight. ? A BMI of 18.5 to 24.9 is normal. ? A BMI of 25 to 29.9 is considered overweight. ? A BMI of 30 and above is considered obese.  Watch levels of cholesterol and blood lipids  You should start having your blood tested for lipids and cholesterol at 54 years of age, then have this test every 5 years.  You may need to have your cholesterol levels checked more often if: ? Your lipid or cholesterol levels are high. ? You are older than 54 years of age. ? You are at high risk for heart disease.  Cancer screening Lung Cancer  Lung cancer screening is recommended for adults 57-2 years old who are at high risk for lung cancer because of a history of smoking.  A yearly low-dose CT scan of the lungs is recommended for people who: ? Currently smoke. ? Have quit within the past 15 years. ? Have at least a 30-pack-year history of smoking. A pack year is smoking an average of one pack of cigarettes a day for 1 year.  Yearly screening should continue until it has been 15 years since you quit.  Yearly screening should stop if you develop a health problem that would prevent you from having lung cancer treatment.  Breast Cancer  Practice breast self-awareness. This means understanding how your breasts normally appear and feel.  It also means doing regular breast self-exams. Let your health care provider know about any changes, no matter how small.  If you are in your 20s or 30s,  you should have a clinical breast exam (CBE) by a health care provider every 1-3 years as part of a regular health exam.  If you are 40 or older, have a CBE every year. Also consider having a breast X-ray (mammogram) every year.  If you have a family history of breast cancer, talk to your health care provider about genetic screening.  If you are at high risk for breast cancer, talk to your health care provider about having an MRI and a mammogram every year.  Breast cancer gene (BRCA) assessment is  recommended for women who have family members with BRCA-related cancers. BRCA-related cancers include: ? Breast. ? Ovarian. ? Tubal. ? Peritoneal cancers.  Results of the assessment will determine the need for genetic counseling and BRCA1 and BRCA2 testing.  Cervical Cancer Your health care provider may recommend that you be screened regularly for cancer of the pelvic organs (ovaries, uterus, and vagina). This screening involves a pelvic examination, including checking for microscopic changes to the surface of your cervix (Pap test). You may be encouraged to have this screening done every 3 years, beginning at age 21.  For women ages 30-65, health care providers may recommend pelvic exams and Pap testing every 3 years, or they may recommend the Pap and pelvic exam, combined with testing for human papilloma virus (HPV), every 5 years. Some types of HPV increase your risk of cervical cancer. Testing for HPV may also be done on women of any age with unclear Pap test results.  Other health care providers may not recommend any screening for nonpregnant women who are considered low risk for pelvic cancer and who do not have symptoms. Ask your health care provider if a screening pelvic exam is right for you.  If you have had past treatment for cervical cancer or a condition that could lead to cancer, you need Pap tests and screening for cancer for at least 20 years after your treatment. If Pap tests have been discontinued, your risk factors (such as having a new sexual partner) need to be reassessed to determine if screening should resume. Some women have medical problems that increase the chance of getting cervical cancer. In these cases, your health care provider may recommend more frequent screening and Pap tests.  Colorectal Cancer  This type of cancer can be detected and often prevented.  Routine colorectal cancer screening usually begins at 54 years of age and continues through 54 years of  age.  Your health care provider may recommend screening at an earlier age if you have risk factors for colon cancer.  Your health care provider may also recommend using home test kits to check for hidden blood in the stool.  A small camera at the end of a tube can be used to examine your colon directly (sigmoidoscopy or colonoscopy). This is done to check for the earliest forms of colorectal cancer.  Routine screening usually begins at age 50.  Direct examination of the colon should be repeated every 5-10 years through 54 years of age. However, you may need to be screened more often if early forms of precancerous polyps or small growths are found.  Skin Cancer  Check your skin from head to toe regularly.  Tell your health care provider about any new moles or changes in moles, especially if there is a change in a mole's shape or color.  Also tell your health care provider if you have a mole that is larger than the size of a pencil eraser.    Always use sunscreen. Apply sunscreen liberally and repeatedly throughout the day.  Protect yourself by wearing long sleeves, pants, a wide-brimmed hat, and sunglasses whenever you are outside.  Heart disease, diabetes, and high blood pressure  High blood pressure causes heart disease and increases the risk of stroke. High blood pressure is more likely to develop in: ? People who have blood pressure in the high end of the normal range (130-139/85-89 mm Hg). ? People who are overweight or obese. ? People who are African American.  If you are 18-39 years of age, have your blood pressure checked every 3-5 years. If you are 40 years of age or older, have your blood pressure checked every year. You should have your blood pressure measured twice-once when you are at a hospital or clinic, and once when you are not at a hospital or clinic. Record the average of the two measurements. To check your blood pressure when you are not at a hospital or clinic, you  can use: ? An automated blood pressure machine at a pharmacy. ? A home blood pressure monitor.  If you are between 55 years and 79 years old, ask your health care provider if you should take aspirin to prevent strokes.  Have regular diabetes screenings. This involves taking a blood sample to check your fasting blood sugar level. ? If you are at a normal weight and have a low risk for diabetes, have this test once every three years after 54 years of age. ? If you are overweight and have a high risk for diabetes, consider being tested at a younger age or more often. Preventing infection Hepatitis B  If you have a higher risk for hepatitis B, you should be screened for this virus. You are considered at high risk for hepatitis B if: ? You were born in a country where hepatitis B is common. Ask your health care provider which countries are considered high risk. ? Your parents were born in a high-risk country, and you have not been immunized against hepatitis B (hepatitis B vaccine). ? You have HIV or AIDS. ? You use needles to inject street drugs. ? You live with someone who has hepatitis B. ? You have had sex with someone who has hepatitis B. ? You get hemodialysis treatment. ? You take certain medicines for conditions, including cancer, organ transplantation, and autoimmune conditions.  Hepatitis C  Blood testing is recommended for: ? Everyone born from 1945 through 1965. ? Anyone with known risk factors for hepatitis C.  Sexually transmitted infections (STIs)  You should be screened for sexually transmitted infections (STIs) including gonorrhea and chlamydia if: ? You are sexually active and are younger than 54 years of age. ? You are older than 54 years of age and your health care provider tells you that you are at risk for this type of infection. ? Your sexual activity has changed since you were last screened and you are at an increased risk for chlamydia or gonorrhea. Ask your  health care provider if you are at risk.  If you do not have HIV, but are at risk, it may be recommended that you take a prescription medicine daily to prevent HIV infection. This is called pre-exposure prophylaxis (PrEP). You are considered at risk if: ? You are sexually active and do not regularly use condoms or know the HIV status of your partner(s). ? You take drugs by injection. ? You are sexually active with a partner who has HIV.  Talk with   your health care provider about whether you are at high risk of being infected with HIV. If you choose to begin PrEP, you should first be tested for HIV. You should then be tested every 3 months for as long as you are taking PrEP. Pregnancy  If you are premenopausal and you may become pregnant, ask your health care provider about preconception counseling.  If you may become pregnant, take 400 to 800 micrograms (mcg) of folic acid every day.  If you want to prevent pregnancy, talk to your health care provider about birth control (contraception). Osteoporosis and menopause  Osteoporosis is a disease in which the bones lose minerals and strength with aging. This can result in serious bone fractures. Your risk for osteoporosis can be identified using a bone density scan.  If you are 15 years of age or older, or if you are at risk for osteoporosis and fractures, ask your health care provider if you should be screened.  Ask your health care provider whether you should take a calcium or vitamin D supplement to lower your risk for osteoporosis.  Menopause may have certain physical symptoms and risks.  Hormone replacement therapy may reduce some of these symptoms and risks. Talk to your health care provider about whether hormone replacement therapy is right for you. Follow these instructions at home:  Schedule regular health, dental, and eye exams.  Stay current with your immunizations.  Do not use any tobacco products including cigarettes, chewing  tobacco, or electronic cigarettes.  If you are pregnant, do not drink alcohol.  If you are breastfeeding, limit how much and how often you drink alcohol.  Limit alcohol intake to no more than 1 drink per day for nonpregnant women. One drink equals 12 ounces of beer, 5 ounces of wine, or 1 ounces of hard liquor.  Do not use street drugs.  Do not share needles.  Ask your health care provider for help if you need support or information about quitting drugs.  Tell your health care provider if you often feel depressed.  Tell your health care provider if you have ever been abused or do not feel safe at home. This information is not intended to replace advice given to you by your health care provider. Make sure you discuss any questions you have with your health care provider. Document Released: 09/09/2010 Document Revised: 08/02/2015 Document Reviewed: 11/28/2014 Elsevier Interactive Patient Education  [employer].

## 2017-02-19 NOTE — Progress Notes (Signed)
Kristin Whitney June 11, 1960 [id number]    History:    Presents for annual exam.  Cycles becoming more irregular last cycle October. Vasectomy. 04/2016 [name]. History of an endometrial ablation. Normal mammogram history. Greater than 10 years ago cryo-with normal Paps after. Primary care manages labs, recently had labs at [facility] history of GI problems and anemia requiring iron infusions. 2011 negative colonoscopy.  Past medical history, past surgical history, family history and social history were all reviewed and documented in the EPIC chart. Works at [employer] in [place]. Children are 31 and 27 and 1 grandchild, all doing well.  ROS:  A ROS was performed and pertinent positives and negatives are included.  Exam:  Vitals:   02/19/17 1135  BP: 124/80  Weight: 177 lb (80.3 kg)  Height: 5\' 2"  (1.575 m)   Body mass index is 32.37 kg/m.   General appearance:  Normal Thyroid:  Symmetrical, normal in size, without palpable masses or nodularity. Respiratory  Auscultation:  Clear without wheezing or rhonchi Cardiovascular  Auscultation:  Regular rate, without rubs, murmurs or gallops  Edema/varicosities:  Not grossly evident Abdominal  Soft,nontender, without masses, guarding or rebound.  Liver/spleen:  No organomegaly noted  Hernia:  None appreciated  Skin  Inspection:  Grossly normal   Breasts: Examined lying and sitting.     Right: Without masses, retractions, discharge or axillary adenopathy.     Left: Without masses, retractions, discharge or axillary adenopathy. Gentitourinary   Inguinal/mons:  Normal without inguinal adenopathy  External genitalia:  Normal  BUS/Urethra/Skene's glands:  Normal  Vagina:  Normal  Cervix:  Normal  Uterus:  normal in size, shape and contour.  Midline and mobile  Adnexa/parametria:     Rt: Without masses or tenderness.   Lt: Without masses or tenderness.  Anus and perineum: Normal  Digital rectal exam: Normal sphincter tone without  palpated masses or tenderness  Assessment/Plan:  56 y.o. MWF G2 P2 for annual exam with no complaints.  Perimenopausal/vasectomy History of endometrial ablation Greater than 10 years ago cryo- normal Paps after GI issues-[facility] managing-follow-up scheduled History of anemia.  Plan: Menopause discussed, instructed to call if heavy or irregular bleeding. SBE's, continue annual screening mammogram, calcium rich diet, MVI daily with iron, iron rich foods  encouraged. Reviewed importance of increasing regular exercise, decreasing calories/carbs for weight loss. Pap normal 2017, new screening guidelines reviewed.   Kristin Whitney Kristin Whitney, 12:11 PM 02/19/2017

## 2017-03-06 LAB — HM COLONOSCOPY

## 2017-03-25 ENCOUNTER — Encounter: Payer: Self-pay | Admitting: Family Medicine

## 2017-03-27 ENCOUNTER — Encounter: Payer: Self-pay | Admitting: Family Medicine

## 2017-03-27 DIAGNOSIS — K2970 Gastritis, unspecified, without bleeding: Secondary | ICD-10-CM | POA: Insufficient documentation

## 2017-05-21 ENCOUNTER — Other Ambulatory Visit: Payer: Self-pay

## 2017-05-21 ENCOUNTER — Ambulatory Visit (INDEPENDENT_AMBULATORY_CARE_PROVIDER_SITE_OTHER): Payer: BC Managed Care – PPO | Admitting: Family Medicine

## 2017-05-21 ENCOUNTER — Other Ambulatory Visit: Payer: Self-pay | Admitting: Family Medicine

## 2017-05-21 ENCOUNTER — Encounter: Payer: Self-pay | Admitting: Family Medicine

## 2017-05-21 VITALS — BP 128/82 | HR 78 | Temp 98.0°F | Ht 62.0 in | Wt 179.0 lb

## 2017-05-21 DIAGNOSIS — R5382 Chronic fatigue, unspecified: Secondary | ICD-10-CM | POA: Diagnosis not present

## 2017-05-21 DIAGNOSIS — Z13220 Encounter for screening for lipoid disorders: Secondary | ICD-10-CM | POA: Diagnosis not present

## 2017-05-21 DIAGNOSIS — Z0000 Encounter for general adult medical examination without abnormal findings: Secondary | ICD-10-CM

## 2017-05-21 MED ORDER — ALPRAZOLAM 0.5 MG PO TABS: 0.5000 mg | ORAL_TABLET | Freq: Every evening | ORAL | 2 refills | Status: DC | PRN

## 2017-05-21 NOTE — Progress Notes (Signed)
Subjective:    Patient ID: Kristin Whitney, female    DOB: [birth year], 57 y.o.   MRN: [id number]  HPI  Patient is a very pleasant 57 year old white female here today for CPE.  Past medical history is significant for anemia, borderline hypertension, depression which was situational, ADHD. She does have a history of degenerative disc disease in her cervical spine the resolved gradually in 2015. Patient had a colonoscopy in 2010. She is not due again until 2020. She sees a gynecologist performs her pelvic exam, Pap smear, and mammogram.  Hepatitis C screening is already up-to-date.  Patient continues to report fatigue.  She continues to report trouble losing weight.  She continues to have dry skin and thinning hair.  She is extremely concerned that there is a problem with her thyroid gland.  Last year, TSH levels, free T3, free T4 levels were all within normal range.  Today she is requesting that we check reverse T3 along with thyroid peroxidase antibody as well as thyroid-stimulating immunoglobulin.  This is based on research that she has done independently as well as discussions with coworkers.  In all honesty, I am not familiar with reverse T3.  I do not believe that she requires thyroid-stimulating immunoglobulin testing as she has no symptoms of Graves' disease.  Thyroid peroxidase antibody is reasonable however if her TSH and T3 and T4 levels are normal I am not sure how we would treat that just due to the presence of an antibody.  I tried to explain that the best I can however the patient is obviously very concerned.  I have not screen the patient for adrenal insufficiency Past Medical History:  Diagnosis Date  . ADHD (attention deficit hyperactivity disorder)   . Anemia   . Depression   . Fibrocystic breast changes    left  . Gastritis    egd- 2018 Dr. Collene Mares   Past Surgical History:  Procedure Laterality Date  . BREAST BIOPSY Left 2012  . GYNECOLOGIC CRYOSURGERY    . NASAL SEPTUM  SURGERY  1994  . PELVIC LAPAROSCOPY  12/09/2008   PELVIC WASHINGS, EXCISION OF TORSED LEFT PARATUBULAR CYST. ROLLERBALL ENDOMETRIAL ABLATION   Current Outpatient Medications on File Prior to Visit  Medication Sig Dispense Refill  . ALPRAZolam (XANAX) 0.5 MG tablet Take 0.5 mg by mouth at bedtime as needed. Reported on 08/02/2015    . Cholecalciferol (VITAMIN D PO) Take 1 tablet by mouth daily.    . Cyanocobalamin (VITAMIN B-12 PO) Take 1 tablet by mouth daily.    Marland Kitchen MILK THISTLE PLUS PO Take by mouth as directed.    . Multiple Vitamin (MULTIVITAMIN) tablet Take 1 tablet by mouth daily.     No current facility-administered medications on file prior to visit.    Allergies  Allergen Reactions  . Clarithromycin   . Codeine Nausea And Vomiting   Social History   Socioeconomic History  . Marital status: Married    Spouse name: Not on file  . Number of children: Not on file  . Years of education: Not on file  . Highest education level: Not on file  Social Needs  . Financial resource strain: Not on file  . Food insecurity - worry: Not on file  . Food insecurity - inability: Not on file  . Transportation needs - medical: Not on file  . Transportation needs - non-medical: Not on file  Occupational History  . Not on file  Tobacco Use  . Smoking status: Never Smoker  .  Smokeless tobacco: Never Used  Substance and Sexual Activity  . Alcohol use: Yes    Alcohol/week: 0.0 oz    Comment: WINE - rare  . Drug use: No  . Sexual activity: Yes    Birth control/protection: None, Other-see comments    Comment: PATIENT'S HUSBAND WITH VASECTOMY  Other Topics Concern  . Not on file  Social History Narrative  . Not on file   Family History  Problem Relation Age of Onset  . Hyperlipidemia Mother   . Alzheimer's disease Mother   . Breast cancer Maternal Aunt   . Breast cancer Maternal Aunt   . Stroke Father   . Alcohol abuse Father   . Breast cancer Cousin       Review of Systems    All other systems reviewed and are negative.      Objective:   Physical Exam  Constitutional: She is oriented to person, place, and time. She appears well-developed and well-nourished. No distress.  HENT:  Head: Normocephalic and atraumatic.  Right Ear: External ear normal.  Left Ear: External ear normal.  Nose: Nose normal.  Mouth/Throat: Oropharynx is clear and moist. No oropharyngeal exudate.  Eyes: Conjunctivae and EOM are normal. Pupils are equal, round, and reactive to light. Right eye exhibits no discharge. Left eye exhibits no discharge. No scleral icterus.  Neck: Normal range of motion. Neck supple. No JVD present. No tracheal deviation present. No thyromegaly present.  Cardiovascular: Normal rate, regular rhythm, normal heart sounds and intact distal pulses. Exam reveals no gallop and no friction rub.  No murmur heard. Pulmonary/Chest: Effort normal and breath sounds normal. No stridor. No respiratory distress. She has no wheezes. She has no rales. She exhibits no tenderness.  Abdominal: Soft. Bowel sounds are normal. She exhibits no distension and no mass. There is no tenderness. There is no rebound and no guarding.  Musculoskeletal: Normal range of motion. She exhibits no edema, tenderness or deformity.  Lymphadenopathy:    She has no cervical adenopathy.  Neurological: She is alert and oriented to person, place, and time. She has normal reflexes. No cranial nerve deficit. She exhibits normal muscle tone. Coordination normal.  Skin: Skin is warm. No rash noted. She is not diaphoretic. No erythema. No pallor.  Psychiatric: She has a normal mood and affect. Her behavior is normal. Judgment and thought content normal.  Vitals reviewed.         Assessment & Plan:  Routine general medical examination at a health care facility  Patient's physical exam today is completely normal.  Mammogram was performed in November and is up-to-date.  Pap smears performed by her  gynecologist.  Colonoscopy is up-to-date.  Regarding her fatigue, I will check a TSH, T3, and free T4.  In an effort to assuage her fears, I will also check thyroid peroxidase antibody although we did have a long discussion about its implication particularly if thyroid levels are normal.  I recommended against checking reverse T3 simply due to my ignorance of this test.  I also recommended against checking thyroid-stimulating immunoglobulin based on her clinical exam and symptoms.  I will screen for adrenal insufficiency with a cortisol level.  If all above lab tests are normal, I have recommended consultation with an endocrinologist for a second opinion in an effort to either assuage her concerns or provide her with an answer.  My leading suspicion is perimenopausal symptoms coupled with anxiety.

## 2017-05-22 ENCOUNTER — Encounter: Payer: Self-pay | Admitting: Family Medicine

## 2017-05-22 LAB — CBC WITH DIFFERENTIAL/PLATELET
Basophils Absolute: 41 cells/uL (ref 0–200)
Basophils Relative: 0.6 %
Eosinophils Absolute: 69 cells/uL (ref 15–500)
Eosinophils Relative: 1 %
HCT: 41.3 % (ref 35.0–45.0)
Hemoglobin: 13.5 g/dL (ref 11.7–15.5)
Lymphs Abs: 1946 cells/uL (ref 850–3900)
MCH: 28.4 pg (ref 27.0–33.0)
MCHC: 32.7 g/dL (ref 32.0–36.0)
MCV: 86.9 fL (ref 80.0–100.0)
MPV: 11.5 fL (ref 7.5–12.5)
Monocytes Relative: 7.7 %
Neutro Abs: 4313 cells/uL (ref 1500–7800)
Neutrophils Relative %: 62.5 %
Platelets: 267 10*3/uL (ref 140–400)
RBC: 4.75 10*6/uL (ref 3.80–5.10)
RDW: 13.7 % (ref 11.0–15.0)
Total Lymphocyte: 28.2 %
WBC mixed population: 531 cells/uL (ref 200–950)
WBC: 6.9 10*3/uL (ref 3.8–10.8)

## 2017-05-22 LAB — COMPLETE METABOLIC PANEL WITH GFR
AG Ratio: 1.4 (calc) (ref 1.0–2.5)
ALT: 27 U/L (ref 6–29)
AST: 28 U/L (ref 10–35)
Albumin: 4.1 g/dL (ref 3.6–5.1)
Alkaline phosphatase (APISO): 78 U/L (ref 33–130)
BUN: 9 mg/dL (ref 7–25)
CO2: 28 mmol/L (ref 20–32)
Calcium: 9 mg/dL (ref 8.6–10.4)
Chloride: 104 mmol/L (ref 98–110)
Creat: 0.62 mg/dL (ref 0.50–1.05)
GFR, Est African American: 118 mL/min/{1.73_m2} (ref >=60)
GFR, Est Non African American: 102 mL/min/{1.73_m2} (ref >=60)
Globulin: 2.9 g/dL (calc) (ref 1.9–3.7)
Glucose, Bld: 90 mg/dL (ref 65–99)
Potassium: 4.6 mmol/L (ref 3.5–5.3)
Sodium: 139 mmol/L (ref 135–146)
Total Bilirubin: 0.3 mg/dL (ref 0.2–1.2)
Total Protein: 7 g/dL (ref 6.1–8.1)

## 2017-05-22 LAB — T3: T3, Total: 109 ng/dL (ref 76–181)

## 2017-05-22 LAB — THYROID PEROXIDASE ANTIBODY: Thyroperoxidase Ab SerPl-aCnc: <1 IU/mL (ref <9)

## 2017-05-22 LAB — LIPID PANEL
Cholesterol: 209 mg/dL — ABNORMAL HIGH (ref <200)
HDL: 71 mg/dL (ref >50)
LDL Cholesterol (Calc): 116 mg/dL (calc) — ABNORMAL HIGH
Non-HDL Cholesterol (Calc): 138 mg/dL (calc) — ABNORMAL HIGH (ref <130)
Total CHOL/HDL Ratio: 2.9 (calc) (ref <5.0)
Triglycerides: 114 mg/dL (ref <150)

## 2017-05-22 LAB — T4, FREE: Free T4: 1.1 ng/dL (ref 0.8–1.8)

## 2017-05-22 LAB — CORTISOL-AM, BLOOD: Cortisol - AM: 14.5 ug/dL

## 2017-05-22 LAB — TSH: TSH: 1.67 mIU/L

## 2017-07-01 ENCOUNTER — Ambulatory Visit (INDEPENDENT_AMBULATORY_CARE_PROVIDER_SITE_OTHER): Payer: BC Managed Care – PPO | Admitting: Orthopaedic Surgery

## 2017-07-07 ENCOUNTER — Other Ambulatory Visit: Payer: Self-pay | Admitting: Physician Assistant

## 2017-09-16 ENCOUNTER — Ambulatory Visit (INDEPENDENT_AMBULATORY_CARE_PROVIDER_SITE_OTHER): Payer: BC Managed Care – PPO | Admitting: Women's Health

## 2017-09-16 ENCOUNTER — Encounter: Payer: Self-pay | Admitting: Women's Health

## 2017-09-16 VITALS — BP 148/90 | Wt 179.0 lb

## 2017-09-16 DIAGNOSIS — B3731 Acute candidiasis of vulva and vagina: Secondary | ICD-10-CM

## 2017-09-16 DIAGNOSIS — N898 Other specified noninflammatory disorders of vagina: Secondary | ICD-10-CM | POA: Diagnosis not present

## 2017-09-16 DIAGNOSIS — K640 First degree hemorrhoids: Secondary | ICD-10-CM | POA: Diagnosis not present

## 2017-09-16 DIAGNOSIS — B373 Candidiasis of vulva and vagina: Secondary | ICD-10-CM | POA: Diagnosis not present

## 2017-09-16 LAB — WET PREP FOR TRICH, YEAST, CLUE

## 2017-09-16 MED ORDER — FLUCONAZOLE 150 MG PO TABS: ORAL_TABLET | ORAL | 1 refills | Status: DC

## 2017-09-16 MED ORDER — HYDROCORTISONE ACE-PRAMOXINE 2.5-1 % RE CREA: 1.0000 "application " | TOPICAL_CREAM | Freq: Three times a day (TID) | RECTAL | 1 refills | Status: DC

## 2017-09-16 NOTE — Progress Notes (Signed)
57 year old MWF G2, P2 presents with complaint of rectal pain with intense itching, hemorrhoid, and vaginal irritation for the past week.  Tried over-the-counter Preparation H with minimal relief.  Has had some constipation and recently started Metamucil.  Postmenopausal with no bleeding on no HRT.  Denies urinary symptoms, abdominal pain or fever, minimal vaginal discharge.  Blood pressure elevated, states it is always up at [place] but when she checks it at home it is less than 130/80.  Exam: Appears well, obese.  External genitalia erythematous at introitus, speculum exam scant discharge vaginal walls mildly erythematous, wet prep positive for yeast.  1 cm nonthrombosed rectal hemorrhoid.  Yeast vaginitis Small hemorrhoid  Plan: Analpram rectal cream 3 times daily as needed, constipation prevention discussed, increase plain water.  Diflucan 150 p.o. today and repeat in 3 days if needed.  Loose clothing, yeast and hemorrhoid prevention discussed.  Call if no relief or continued problems.  Continue  to check blood pressure at home if elevated follow-up with primary care.

## 2017-09-16 NOTE — Patient Instructions (Signed)
Vaginal Yeast infection, Adult Vaginal yeast infection is a condition that causes soreness, swelling, and redness (inflammation) of the vagina. It also causes vaginal discharge. This is a common condition. Some women get this infection frequently. What are the causes? This condition is caused by a change in the normal balance of the yeast (candida) and bacteria that live in the vagina. This change causes an overgrowth of yeast, which causes the inflammation. What increases the risk? This condition is more likely to develop in:  Women who take antibiotic medicines.  Women who have diabetes.  Women who take birth control pills.  Women who are pregnant.  Women who douche often.  Women who have a weak defense (immune) system.  Women who have been taking steroid medicines for a long time.  Women who frequently wear tight clothing.  What are the signs or symptoms? Symptoms of this condition include:  White, thick vaginal discharge.  Swelling, itching, redness, and irritation of the vagina. The lips of the vagina (vulva) may be affected as well.  Pain or a burning feeling while urinating.  Pain during sex.  How is this diagnosed? This condition is diagnosed with a medical history and physical exam. This will include a pelvic exam. Your health care provider will examine a sample of your vaginal discharge under a microscope. Your health care provider may send this sample for testing to confirm the diagnosis. How is this treated? This condition is treated with medicine. Medicines may be over-the-counter or prescription. You may be told to use one or more of the following:  Medicine that is taken orally.  Medicine that is applied as a cream.  Medicine that is inserted directly into the vagina (suppository).  Follow these instructions at home:  Take or apply over-the-counter and prescription medicines only as told by your health care provider.  Do not have sex until your health  care provider has approved. Tell your sex partner that you have a yeast infection. That person should go to his or her health care provider if he or she develops symptoms.  Do not wear tight clothes, such as pantyhose or tight pants.  Avoid using tampons until your health care provider approves.  Eat more yogurt. This may help to keep your yeast infection from returning.  Try taking a sitz bath to help with discomfort. This is a warm water bath that is taken while you are sitting down. The water should only come up to your hips and should cover your buttocks. Do this 3-4 times per day or as told by your health care provider.  Do not douche.  Wear breathable, cotton underwear.  If you have diabetes, keep your blood sugar levels under control. Contact a health care provider if:  You have a fever.  Your symptoms go away and then return.  Your symptoms do not get better with treatment.  Your symptoms get worse.  You have new symptoms.  You develop blisters in or around your vagina.  You have blood coming from your vagina and it is not your menstrual period.  You develop pain in your abdomen. This information is not intended to replace advice given to you by your health care provider. Make sure you discuss any questions you have with your health care provider. Document Released: 12/04/2004 Document Revised: 08/08/2015 Document Reviewed: 08/28/2014 Elsevier Interactive Patient Education  2018 [employer]. Hemorrhoids Hemorrhoids are swollen veins in and around the rectum or anus. Hemorrhoids can cause pain, itching, or bleeding. Most of  the time, they do not cause serious problems. They usually get better with diet changes, lifestyle changes, and other home treatments. Follow these instructions at home: Eating and drinking  Eat foods that have fiber, such as whole grains, beans, nuts, fruits, and vegetables. Ask your doctor about taking products that have added fiber  (fibersupplements).  Drink enough fluid to keep your pee (urine) clear or pale yellow. For Pain and Swelling  Take a warm-water bath (sitz bath) for 20 minutes to ease pain. Do this 3-4 times a day.  If directed, put ice on the painful area. It may be helpful to use ice between your warm baths. ? Put ice in a plastic bag. ? Place a towel between your skin and the bag. ? Leave the ice on for 20 minutes, 2-3 times a day. General instructions  Take over-the-counter and prescription medicines only as told by your doctor. ? Medicated creams and medicines that are inserted into the anus (suppositories) may be used or applied as told.  Exercise often.  Go to the bathroom when you have the urge to poop (to have a bowel movement). Do not wait.  Avoid pushing too hard (straining) when you poop.  Keep the butt area dry and clean. Use wet toilet paper or moist paper towels.  Do not sit on the toilet for a long time. Contact a doctor if:  You have any of these: ? Pain and swelling that do not get better with treatment or medicine. ? Bleeding that will not stop. ? Trouble pooping or you cannot poop. ? Pain or swelling outside the area of the hemorrhoids. This information is not intended to replace advice given to you by your health care provider. Make sure you discuss any questions you have with your health care provider. Document Released: 12/04/2007 Document Revised: 08/02/2015 Document Reviewed: 11/08/2014 Elsevier Interactive Patient Education  [employer].

## 2017-12-11 ENCOUNTER — Encounter: Payer: Self-pay | Admitting: Family Medicine

## 2017-12-11 ENCOUNTER — Ambulatory Visit: Payer: BC Managed Care – PPO | Admitting: Family Medicine

## 2017-12-11 VITALS — Temp 98.7°F | Resp 18 | Wt 177.6 lb

## 2017-12-11 DIAGNOSIS — R42 Dizziness and giddiness: Secondary | ICD-10-CM | POA: Diagnosis not present

## 2017-12-11 DIAGNOSIS — R002 Palpitations: Secondary | ICD-10-CM

## 2017-12-11 LAB — COMPLETE METABOLIC PANEL WITH GFR
AG Ratio: 1.6 (calc) (ref 1.0–2.5)
ALT: 26 U/L (ref 6–29)
AST: 26 U/L (ref 10–35)
Albumin: 4.4 g/dL (ref 3.6–5.1)
Alkaline phosphatase (APISO): 86 U/L (ref 33–130)
BUN: 12 mg/dL (ref 7–25)
CO2: 28 mmol/L (ref 20–32)
Calcium: 9.8 mg/dL (ref 8.6–10.4)
Chloride: 101 mmol/L (ref 98–110)
Creat: 0.78 mg/dL (ref 0.50–1.05)
GFR, Est African American: 100 mL/min/{1.73_m2} (ref >=60)
GFR, Est Non African American: 86 mL/min/{1.73_m2} (ref >=60)
Globulin: 2.8 g/dL (calc) (ref 1.9–3.7)
Glucose, Bld: 102 mg/dL — ABNORMAL HIGH (ref 65–99)
Potassium: 5.1 mmol/L (ref 3.5–5.3)
Sodium: 138 mmol/L (ref 135–146)
Total Bilirubin: 0.3 mg/dL (ref 0.2–1.2)
Total Protein: 7.2 g/dL (ref 6.1–8.1)

## 2017-12-11 LAB — CBC WITH DIFFERENTIAL/PLATELET
Basophils Absolute: 133 cells/uL (ref 0–200)
Basophils Relative: 1.7 %
Eosinophils Absolute: 140 cells/uL (ref 15–500)
Eosinophils Relative: 1.8 %
HCT: 39.6 % (ref 35.0–45.0)
Hemoglobin: 13 g/dL (ref 11.7–15.5)
Lymphs Abs: 2246 cells/uL (ref 850–3900)
MCH: 28.4 pg (ref 27.0–33.0)
MCHC: 32.8 g/dL (ref 32.0–36.0)
MCV: 86.7 fL (ref 80.0–100.0)
MPV: 12.3 fL (ref 7.5–12.5)
Monocytes Relative: 6.5 %
Neutro Abs: 4774 cells/uL (ref 1500–7800)
Neutrophils Relative %: 61.2 %
Platelets: 295 10*3/uL (ref 140–400)
RBC: 4.57 10*6/uL (ref 3.80–5.10)
RDW: 12.7 % (ref 11.0–15.0)
Total Lymphocyte: 28.8 %
WBC mixed population: 507 cells/uL (ref 200–950)
WBC: 7.8 10*3/uL (ref 3.8–10.8)

## 2017-12-11 LAB — TSH: TSH: 1.57 mIU/L

## 2017-12-11 NOTE — Progress Notes (Signed)
Patient ID: Kristin Whitney, female    DOB: 1960-10-02, 57 y.o.   MRN: [id number]  PCP: Susy Frizzle, MD  Chief Complaint  Patient presents with  . Dizziness    when exercising  . left jaw pain  . left arm discomfort  . Tachycardia    Subjective:   Kristin Whitney is a 57 y.o. female, presents to clinic with CC of The last ocuple months she has been trying to get on the treadmill and loose weight and get helathier, but when getting off the treadmill she is getting dizzy.  Fast walking 30 min always when getting off the treadmill.  She will get dizzy during her walking and she has to hold onto the rails, she feels like she might loose her balance.  After shes done it will resolve in about 20 minutes. She has various other symptoms that come and go in for 1.5 weeks, "heart fluttering" while sitting at her desk" no caffeine, also having reflux and indigestion, she has some coming and going left jaw and left arm pain, it feels "heavy" like she can't take a deep breath in. Palpitations occurring at rest, two noticeable times, at work and once last night laying in bed had flutters for 10 min, almost makes me feel like i'm going to pass out.  Then pain in left side woke her up last night, achy,  Hx of elevated cholesterol, obese,    Patient Active Problem List   Diagnosis Date Noted  . Gastritis   . Irregular menstrual cycle 04/28/2016  . History of vitamin D deficiency 12/05/2014  . Perimenopause 12/05/2014  . History of anemia 06/07/2012  . Tiredness 06/07/2012  . Menstrual migraine 06/02/2011     Prior to Admission medications   Medication Sig Start Date End Date Taking? Authorizing Provider  ALPRAZolam Duanne Moron) 0.5 MG tablet Take 1 tablet (0.5 mg total) by mouth at bedtime as needed. Reported on 08/02/2015 05/21/17  Yes Susy Frizzle, MD  loratadine (CLARITIN) 10 MG tablet Take 10 mg by mouth daily.   Yes [provider]  Cholecalciferol (VITAMIN D PO) Take 1  tablet by mouth daily.    [provider]  Cyanocobalamin (VITAMIN B-12 PO) Take 1 tablet by mouth daily.    [provider]  MILK THISTLE PLUS PO Take by mouth as directed.    [provider]  Multiple Vitamin (MULTIVITAMIN) tablet Take 1 tablet by mouth daily.    [provider]  omeprazole (PRILOSEC) 40 MG capsule  02/11/17   [provider]     Allergies  Allergen Reactions  . Clarithromycin   . Codeine Nausea And Vomiting     Family History  Problem Relation Age of Onset  . Hyperlipidemia Mother   . Alzheimer's disease Mother   . Breast cancer Maternal Aunt   . Breast cancer Maternal Aunt   . Stroke Father   . Alcohol abuse Father   . Breast cancer Cousin      Social History   Socioeconomic History  . Marital status: Married    Spouse name: Not on file  . Number of children: Not on file  . Years of education: Not on file  . Highest education level: Not on file  Occupational History  . Not on file  Social Needs  . Financial resource strain: Not on file  . Food insecurity:    Worry: Not on file    Inability: Not on file  .  Transportation needs:    Medical: Not on file    Non-medical: Not on file  Tobacco Use  . Smoking status: Never Smoker  . Smokeless tobacco: Never Used  Substance and Sexual Activity  . Alcohol use: Yes    Alcohol/week: 0.0 standard drinks    Comment: WINE - rare  . Drug use: No  . Sexual activity: Yes    Birth control/protection: None, Other-see comments    Comment: PATIENT'S HUSBAND WITH VASECTOMY  Lifestyle  . Physical activity:    Days per week: Not on file    Minutes per session: Not on file  . Stress: Not on file  Relationships  . Social connections:    Talks on phone: Not on file    Gets together: Not on file    Attends religious service: Not on file    Active member of club or organization: Not on file    Attends meetings of clubs or organizations: Not on file    Relationship  status: Not on file  . Intimate partner violence:    Fear of current or ex partner: Not on file    Emotionally abused: Not on file    Physically abused: Not on file    Forced sexual activity: Not on file  Other Topics Concern  . Not on file  Social History Narrative  . Not on file     Review of Systems  Constitutional: Negative.  Negative for activity change, appetite change, chills, diaphoresis, fatigue, fever and unexpected weight change.  HENT: Negative.   Eyes: Negative.   Respiratory: Negative.  Negative for shortness of breath.   Cardiovascular: Negative.  Negative for chest pain, palpitations and leg swelling.  Gastrointestinal: Negative.  Negative for abdominal pain and blood in stool.  Endocrine: Negative.   Genitourinary: Negative.   Musculoskeletal: Negative.  Negative for arthralgias, gait problem, joint swelling and myalgias.  Skin: Negative.  Negative for color change, pallor and rash.  Allergic/Immunologic: Negative.   Neurological: Negative.  Negative for syncope, facial asymmetry, weakness and numbness.  Hematological: Negative.   Psychiatric/Behavioral: Negative.  Negative for confusion, dysphoric mood, self-injury and suicidal ideas. The patient is not nervous/anxious.   All other systems reviewed and are negative.      Objective:    Vitals:   12/11/17 1410 12/11/17 1418 12/11/17 1419  Resp: 18    Temp: 98.7 F (37.1 C)    TempSrc: Oral    SpO2:  97% 98%  Weight: 177 lb 9.6 oz (80.6 kg)      12/11/17 2:10 PM  12/11/17 2:18 PM  12/11/17 2:19 PM      BP  160/98  154/100  142/92   BP Location  LeftArm  LeftArm  LeftArm   Patient Position  Sitting  Sitting  Standing   Cuff Size  Normal  Normal  Normal   Pulse  78  78  80   Resp  18     Temp  98.66F(37.1C)     Temp src  Oral     SpO2   97%  98%   Weight  177lb9.6oz(80.6kg)         Physical Exam  Constitutional: She is oriented to person, place, and time. She  appears well-developed and well-nourished.  Non-toxic appearance. No distress.  HENT:  Head: Normocephalic and atraumatic.  Right Ear: External ear normal.  Left Ear: External ear normal.  Nose: Nose normal.  Mouth/Throat: Uvula is midline, oropharynx is clear and moist and mucous membranes  are normal.  Eyes: Pupils are equal, round, and reactive to light. Conjunctivae, EOM and lids are normal.  Neck: Normal range of motion and phonation normal. Neck supple. No tracheal deviation present.  Cardiovascular: Normal rate, regular rhythm, normal heart sounds and normal pulses. Exam reveals no gallop and no friction rub.  No murmur heard. Pulses:      Radial pulses are 2+ on the right side, and 2+ on the left side.       Posterior tibial pulses are 2+ on the right side, and 2+ on the left side.  Pulmonary/Chest: Effort normal and breath sounds normal. No stridor. No respiratory distress. She has no wheezes. She has no rhonchi. She has no rales. She exhibits no tenderness.  Abdominal: Soft. Normal appearance and bowel sounds are normal. She exhibits no distension. There is no tenderness. There is no rebound and no guarding.  Musculoskeletal: Normal range of motion. She exhibits no edema or deformity.  Lymphadenopathy:    She has no cervical adenopathy.  Neurological: She is alert and oriented to person, place, and time. No cranial nerve deficit or sensory deficit. She exhibits normal muscle tone. Coordination and gait normal.  Skin: Skin is warm, dry and intact. Capillary refill takes less than 2 seconds. No rash noted. She is not diaphoretic. No pallor.  Psychiatric: Her speech is normal. Judgment and thought content normal. Her mood appears anxious. She is hyperactive.  Nursing note and vitals reviewed.    EKG: sinus rhythm, poor r-wave progression, normal axis, no ST elevation or depression     Assessment & Plan:      ICD-10-CM   1. Palpitations R00.2 EKG 12-Lead    TSH    COMPLETE  METABOLIC PANEL WITH GFR    CBC with Differential  2. Dizziness Z61 COMPLETE METABOLIC PANEL WITH GFR    CBC with Differential    Patient is a 57 year old female comes in with some dizziness which she describes as losing balance while exercising on the treadmill, this has not had any associated symptoms she is able to complete her workout, she has had no visual disturbances, chest pain, shortness of breath during that time however she has had some palpitations also in the past 2 weeks so she came in for evaluation.  Still extensive amount of other random and intermittent symptoms which do not seem associated with either the palpitations or dizziness complains.    EKG is unremarkable in clinic, will obtain basic lab work to evaluate.  I did discuss work-up with her of obtaining Holter monitor and possibly doing echo or referring to cardiology and per her request we will refer her to cardiology after labs result.    Patient given strict return precautions and ER precautions which she verbalizes understanding of.  Delsa Grana, PA-C 12/11/17 2:22 PM

## 2017-12-14 NOTE — Addendum Note (Signed)
Addended by: Delsa Grana on: 12/14/2017 03:21 PM   Modules accepted: Orders

## 2017-12-18 ENCOUNTER — Encounter: Payer: Self-pay | Admitting: *Deleted

## 2017-12-23 ENCOUNTER — Other Ambulatory Visit: Payer: Self-pay | Admitting: Family Medicine

## 2017-12-23 DIAGNOSIS — Z1231 Encounter for screening mammogram for malignant neoplasm of breast: Secondary | ICD-10-CM

## 2018-01-04 ENCOUNTER — Telehealth: Payer: Self-pay | Admitting: Family Medicine

## 2018-01-04 ENCOUNTER — Other Ambulatory Visit: Payer: BC Managed Care – PPO

## 2018-01-04 DIAGNOSIS — Z8639 Personal history of other endocrine, nutritional and metabolic disease: Secondary | ICD-10-CM

## 2018-01-04 NOTE — Telephone Encounter (Signed)
Patient came in today for labs to be drawn. She was requesting a vitamin B and Vitamin D to be drawn. She stated that they have been low in the past and she wanted them to be drawn at this time. She has no upcoming appointments. Please advise?

## 2018-01-04 NOTE — Telephone Encounter (Signed)
She does have documented vitamin D deficiency in Epic.  Therefore we can check vitamin D levels due to vitamin D deficiency as the diagnosis.  There has only been one vitamin B12 level checked in Epic and it was normal at over 700.  Therefore I do not believe insurance would pay for this test with a diagnosis of history of vitamin B12 deficiency.  I will be glad to see her to discuss what ever is going on to try to determine why she feels she needs it.

## 2018-01-04 NOTE — Telephone Encounter (Signed)
Spoke with patient and informed her of Dr. Samella Parr recommendations. Vitamin D was ordered and patient scheduled an office visit for 01/11/18

## 2018-01-08 LAB — VITAMIN D 1,25 DIHYDROXY
Vitamin D 1, 25 (OH)2 Total: 41 pg/mL (ref 18–72)
Vitamin D2 1, 25 (OH)2: <8 pg/mL
Vitamin D3 1, 25 (OH)2: 41 pg/mL

## 2018-01-11 ENCOUNTER — Ambulatory Visit: Payer: BC Managed Care – PPO | Admitting: Family Medicine

## 2018-01-11 ENCOUNTER — Encounter: Payer: Self-pay | Admitting: Family Medicine

## 2018-01-11 VITALS — BP 170/120 | HR 78 | Temp 98.2°F | Resp 16 | Ht 62.0 in | Wt 178.0 lb

## 2018-01-11 DIAGNOSIS — R5382 Chronic fatigue, unspecified: Secondary | ICD-10-CM | POA: Diagnosis not present

## 2018-01-11 DIAGNOSIS — G4710 Hypersomnia, unspecified: Secondary | ICD-10-CM | POA: Diagnosis not present

## 2018-01-11 MED ORDER — DIAZEPAM 10 MG PO TABS: 10.0000 mg | ORAL_TABLET | Freq: Every evening | ORAL | 1 refills | Status: DC | PRN

## 2018-01-11 NOTE — Progress Notes (Signed)
Subjective:    Patient ID: Kristin Whitney, female    DOB: Jul 27, 1960, 57 y.o.   MRN: [id number]  HPI  05/2017 Patient is a very pleasant 57 year old white female here today for CPE.  Past medical history is significant for anemia, borderline hypertension, depression which was situational, ADHD. She does have a history of degenerative disc disease in her cervical spine the resolved gradually in 2015. Patient had a colonoscopy in 2010. She is not due again until 2020. She sees a gynecologist performs her pelvic exam, Pap smear, and mammogram.  Hepatitis C screening is already up-to-date.  Patient continues to report fatigue.  She continues to report trouble losing weight.  She continues to have dry skin and thinning hair.  She is extremely concerned that there is a problem with her thyroid gland.  Last year, TSH levels, free T3, free T4 levels were all within normal range.  Today she is requesting that we check reverse T3 along with thyroid peroxidase antibody as well as thyroid-stimulating immunoglobulin.  This is based on research that she has done independently as well as discussions with coworkers.  In all honesty, I am not familiar with reverse T3.  I do not believe that she requires thyroid-stimulating immunoglobulin testing as she has no symptoms of Graves' disease.  Thyroid peroxidase antibody is reasonable however if her TSH and T3 and T4 levels are normal I am not sure how we would treat that just due to the presence of an antibody.  I tried to explain that the best I can however the patient is obviously very concerned.  I have not screen the patient for adrenal insufficiency.  At that time, my plan was: Patient's physical exam today is completely normal.  Mammogram was performed in November and is up-to-date.  Pap smears performed by her gynecologist.  Colonoscopy is up-to-date.  Regarding her fatigue, I will check a TSH, T3, and free T4.  In an effort to assuage her fears, I will also check  thyroid peroxidase antibody although we did have a long discussion about its implication particularly if thyroid levels are normal.  I recommended against checking reverse T3 simply due to my ignorance of this test.  I also recommended against checking thyroid-stimulating immunoglobulin based on her clinical exam and symptoms.  I will screen for adrenal insufficiency with a cortisol level.  If all above lab tests are normal, I have recommended consultation with an endocrinologist for a second opinion in an effort to either assuage her concerns or provide her with an answer.  My leading suspicion is perimenopausal symptoms coupled with anxiety.  01/11/18 Patient's lab work in March was completely normal.  She has been battling fatigue off and on for years.  However it has gradually worsened.  Now she reports feeling extremely tired all the time.  She has no energy.  She feels like she is not resting at night.  No matter how long she sleeps, she never feels like she has slept.  She reports a headache in the mornings when she wakes up.  She reports feeling tired all throughout the day.  Her husband states that she does snore however he denies any witnessed apneic episodes.  Today on our encounter, the patient is extremely anxious.  She tends to perseverate over physical symptoms.  Now she requested test for Lyme disease.  Previously she is requested test for thyroid problems, vitamin B-1 deficiency, vitamin D deficiency.  Recently she was seen in clinic by my partner  for palpitations and has been referred to a cardiologist.  She tends to have frequent somatic complaints.  I believe some of this is related to anxiety she agrees.  She states that she never feels like she can sleep her mind simply will not turn off.  She tends to lay in bed worrying about things.  She also tends to think about things from the past.  For instance she states that a few nights ago she was unable to sleep because she was grieving over her  dogs that she had to put down the spring and blaming herself for not doing more.  She denies depression.  She denies any history of bipolar disorder or family history of bipolar disorder.  She does request Lyme titers today because she believes that she is bitten by ticks quite often and may have Lyme disease as a cause of her fatigue.  He also reports hurting all over.  In the past I have been concerned about fibromyalgia. Past Medical History:  Diagnosis Date  . ADHD (attention deficit hyperactivity disorder)   . Anemia   . Depression   . Fibrocystic breast changes    left  . Gastritis    egd- 2018 Dr. Collene Mares   Past Surgical History:  Procedure Laterality Date  . BREAST BIOPSY Left 2012  . GYNECOLOGIC CRYOSURGERY    . NASAL SEPTUM SURGERY  1994  . PELVIC LAPAROSCOPY  12/09/2008   PELVIC WASHINGS, EXCISION OF TORSED LEFT PARATUBULAR CYST. ROLLERBALL ENDOMETRIAL ABLATION   Current Outpatient Medications on File Prior to Visit  Medication Sig Dispense Refill  . ALPRAZolam (XANAX) 0.5 MG tablet Take 1 tablet (0.5 mg total) by mouth at bedtime as needed. Reported on 08/02/2015 30 tablet 2  . Cholecalciferol (VITAMIN D PO) Take 1 tablet by mouth daily.    . Cyanocobalamin (VITAMIN B-12 PO) Take 1 tablet by mouth daily.    Marland Kitchen loratadine (CLARITIN) 10 MG tablet Take 10 mg by mouth daily.    Marland Kitchen MILK THISTLE PLUS PO Take by mouth as directed.    . Multiple Vitamin (MULTIVITAMIN) tablet Take 1 tablet by mouth daily.    Marland Kitchen omeprazole (PRILOSEC) 40 MG capsule      No current facility-administered medications on file prior to visit.    Allergies  Allergen Reactions  . Clarithromycin   . Codeine Nausea And Vomiting   Social History   Socioeconomic History  . Marital status: Married    Spouse name: Not on file  . Number of children: Not on file  . Years of education: Not on file  . Highest education level: Not on file  Occupational History  . Not on file  Social Needs  . Financial  resource strain: Not on file  . Food insecurity:    Worry: Not on file    Inability: Not on file  . Transportation needs:    Medical: Not on file    Non-medical: Not on file  Tobacco Use  . Smoking status: Never Smoker  . Smokeless tobacco: Never Used  Substance and Sexual Activity  . Alcohol use: Yes    Alcohol/week: 0.0 standard drinks    Comment: WINE - rare  . Drug use: No  . Sexual activity: Yes    Birth control/protection: None, Other-see comments    Comment: PATIENT'S HUSBAND WITH VASECTOMY  Lifestyle  . Physical activity:    Days per week: Not on file    Minutes per session: Not on file  . Stress: Not  on file  Relationships  . Social connections:    Talks on phone: Not on file    Gets together: Not on file    Attends religious service: Not on file    Active member of club or organization: Not on file    Attends meetings of clubs or organizations: Not on file    Relationship status: Not on file  . Intimate partner violence:    Fear of current or ex partner: Not on file    Emotionally abused: Not on file    Physically abused: Not on file    Forced sexual activity: Not on file  Other Topics Concern  . Not on file  Social History Narrative  . Not on file   Family History  Problem Relation Age of Onset  . Hyperlipidemia Mother   . Alzheimer's disease Mother   . Breast cancer Maternal Aunt   . Breast cancer Maternal Aunt   . Stroke Father   . Alcohol abuse Father   . Breast cancer Cousin       Review of Systems  All other systems reviewed and are negative.      Objective:   Physical Exam  Constitutional: She is oriented to person, place, and time. She appears well-developed and well-nourished. No distress.  HENT:  Head: Normocephalic and atraumatic.  Right Ear: External ear normal.  Left Ear: External ear normal.  Nose: Nose normal.  Mouth/Throat: Oropharynx is clear and moist. No oropharyngeal exudate.  Eyes: Pupils are equal, round, and reactive  to light. Conjunctivae and EOM are normal. Right eye exhibits no discharge. Left eye exhibits no discharge. No scleral icterus.  Neck: Normal range of motion. Neck supple. No JVD present. No tracheal deviation present. No thyromegaly present.  Cardiovascular: Normal rate, regular rhythm, normal heart sounds and intact distal pulses. Exam reveals no gallop and no friction rub.  No murmur heard. Pulmonary/Chest: Effort normal and breath sounds normal. No stridor. No respiratory distress. She has no wheezes. She has no rales. She exhibits no tenderness.  Abdominal: Soft. Bowel sounds are normal. She exhibits no distension and no mass. There is no tenderness. There is no rebound and no guarding.  Musculoskeletal: Normal range of motion. She exhibits no edema, tenderness or deformity.  Lymphadenopathy:    She has no cervical adenopathy.  Neurological: She is alert and oriented to person, place, and time. She has normal reflexes. No cranial nerve deficit. She exhibits normal muscle tone. Coordination normal.  Skin: Skin is warm. No rash noted. She is not diaphoretic. No erythema. No pallor.  Psychiatric: She has a normal mood and affect. Her behavior is normal. Judgment and thought content normal.  Vitals reviewed.         Assessment & Plan:  Chronic fatigue - Plan: B. burgdorfi antibodies by WB, Vitamin B12, Sedimentation rate, Ambulatory referral to Sleep Studies  Hypersomnolence - Plan: Ambulatory referral to Sleep Studies  I had a long discussion today with the patient for more than 30 minutes about her symptoms with her husband.  I believe the majority of her symptoms are related to a combination of not sleeping well, uncontrolled anxiety, and possibly obstructive sleep apnea.  I recommended a sleep study to evaluate for sleep apnea.  I will obtain the Lyme titers at the patient request today in addition to vitamin B12 and a sedimentation rate to evaluate for any underlying autoimmune  diseases.  If lab work is normal, I believe we need to focus on controlling  her anxiety.  I recommended a therapeutic trial of Valium 10 mg p.o. nightly for 1 to 2 weeks to see if as she sleeps better does her fatigue improved.  If so, I would turn our attention to better long-term management of anxiety and insomnia rather than using habit-forming medication.  However I would like to do this is a test to see if this could improve her fatigue.

## 2018-01-13 LAB — B. BURGDORFI ANTIBODIES BY WB
B burgdorferi IgG Abs (IB): NEGATIVE
B burgdorferi IgM Abs (IB): NEGATIVE
Lyme Disease 18 kD IgG: REACTIVE — AB
Lyme Disease 23 kD IgG: NONREACTIVE
Lyme Disease 23 kD IgM: NONREACTIVE
Lyme Disease 28 kD IgG: NONREACTIVE
Lyme Disease 30 kD IgG: NONREACTIVE
Lyme Disease 39 kD IgG: NONREACTIVE
Lyme Disease 39 kD IgM: NONREACTIVE
Lyme Disease 41 kD IgG: NONREACTIVE
Lyme Disease 41 kD IgM: NONREACTIVE
Lyme Disease 45 kD IgG: NONREACTIVE
Lyme Disease 58 kD IgG: NONREACTIVE
Lyme Disease 66 kD IgG: NONREACTIVE
Lyme Disease 93 kD IgG: NONREACTIVE

## 2018-01-13 LAB — VITAMIN B12: Vitamin B-12: 861 pg/mL (ref 200–1100)

## 2018-01-13 LAB — SEDIMENTATION RATE: Sed Rate: 22 mm/h (ref 0–30)

## 2018-01-14 ENCOUNTER — Encounter: Payer: Self-pay | Admitting: Cardiovascular Disease

## 2018-01-14 ENCOUNTER — Ambulatory Visit: Payer: BC Managed Care – PPO | Admitting: Cardiovascular Disease

## 2018-01-14 VITALS — BP 162/92 | HR 88 | Ht 62.0 in | Wt 177.2 lb

## 2018-01-14 DIAGNOSIS — R42 Dizziness and giddiness: Secondary | ICD-10-CM

## 2018-01-14 DIAGNOSIS — R072 Precordial pain: Secondary | ICD-10-CM | POA: Diagnosis not present

## 2018-01-14 DIAGNOSIS — R002 Palpitations: Secondary | ICD-10-CM

## 2018-01-14 DIAGNOSIS — R030 Elevated blood-pressure reading, without diagnosis of hypertension: Secondary | ICD-10-CM | POA: Diagnosis not present

## 2018-01-14 DIAGNOSIS — E7800 Pure hypercholesterolemia, unspecified: Secondary | ICD-10-CM

## 2018-01-14 DIAGNOSIS — E669 Obesity, unspecified: Secondary | ICD-10-CM

## 2018-01-14 NOTE — Progress Notes (Signed)
Cardiology Consultation Note:    Date:  01/16/2018   ID:  Kristin Whitney, DOB 27-Oct-1960, MRN [id number]  PCP:  Susy Frizzle, MD  Cardiologist:  No primary care provider on file.  Electrophysiologist:  None   Referring MD: Delsa Grana, PA-C   No chief complaint on file. Kristin Whitney is a 57 y.o. female who is being seen today for the evaluation of chest heaviness, palpitations and dizziness at the request of Delsa Grana, PA-C.   History of Present Illness:    Kristin Whitney is a 57 y.o. female without cardiac or vascular problems, but has recently developed a variety of troublesome symptoms.  She has noticed dizziness after exercise.  She has been trying to walk on the treadmill for about 30 minutes (roughly 3 mph).  When she gets off the treadmill she will often be dizzy, but the symptoms resolved fairly quickly.  She does not have any chest discomfort on the treadmill.    She is most concerned by the fact that she is developed some discomfort on the left side of her chest that radiates to her left arm.  This occurs at rest as well and often also involves her left leg.  Independently of that she has had some discomfort in her left jaw that seems to be more positional and related to movement of the jaw.  Because she had some pain in the left side of her neck she underwent a carotid ultrasound which was a normal study.  She has also noticed some palpitations, although those are not associated with physical activity, usually at rest.  The palpitations used to be worse when she took Concerta.  She has an apple watch with ECG features.  She only used this once while she was having symptoms.  The tracing shows sinus rhythm with some artifact.  She had an electrocardiogram in October 4 did show some poor R wave progression and she was referred for cardiology evaluation.  She denies dyspnea at rest or with activity, orthopnea, PND, leg edema, hypertension, other focal neurological  complaints, syncope, cough, hemoptysis, unilateral calf swelling or tenderness.  Reports fatigue and trouble with attempts at weight loss.  She does not have symptoms of daytime hypersomnolence.  She has difficulty with insomnia and "winding down" at the end of the day.  Her blood pressure is high today and was even higher 3 days ago.  However when she checks her blood pressure at home or at school and is relaxed her blood pressure is typically around 128/70.  Past Medical History:  Diagnosis Date  . ADHD (attention deficit hyperactivity disorder)   . Anemia   . Depression   . Fibrocystic breast changes    left  . Gastritis    egd- 2018 Dr. Collene Mares    Past Surgical History:  Procedure Laterality Date  . BREAST BIOPSY Left 2012  . GYNECOLOGIC CRYOSURGERY    . NASAL SEPTUM SURGERY  1994  . PELVIC LAPAROSCOPY  12/09/2008   PELVIC WASHINGS, EXCISION OF TORSED LEFT PARATUBULAR CYST. ROLLERBALL ENDOMETRIAL ABLATION    Current Medications: Current Meds  Medication Sig  . ALPRAZolam (XANAX) 0.5 MG tablet Take 1 tablet (0.5 mg total) by mouth at bedtime as needed. Reported on 08/02/2015  . Cholecalciferol (VITAMIN D PO) Take 1 tablet by mouth daily.  . Cyanocobalamin (VITAMIN B-12 PO) Take 1 tablet by mouth daily.  . diazepam (VALIUM) 10 MG tablet Take 1 tablet (10 mg total) by mouth at bedtime  as needed for anxiety or sleep.  Marland Kitchen loratadine (CLARITIN) 10 MG tablet Take 10 mg by mouth daily.  Marland Kitchen MILK THISTLE PLUS PO Take by mouth as directed.  . Multiple Vitamin (MULTIVITAMIN) tablet Take 1 tablet by mouth daily.  Marland Kitchen omeprazole (PRILOSEC) 40 MG capsule      Allergies:   Clarithromycin and Codeine   Social History   Socioeconomic History  . Marital status: Married    Spouse name: Not on file  . Number of children: Not on file  . Years of education: Not on file  . Highest education level: Not on file  Occupational History  . Not on file  Social Needs  . Financial resource strain: Not on  file  . Food insecurity:    Worry: Not on file    Inability: Not on file  . Transportation needs:    Medical: Not on file    Non-medical: Not on file  Tobacco Use  . Smoking status: Never Smoker  . Smokeless tobacco: Never Used  Substance and Sexual Activity  . Alcohol use: Yes    Alcohol/week: 0.0 standard drinks    Comment: WINE - rare  . Drug use: No  . Sexual activity: Yes    Birth control/protection: None, Other-see comments    Comment: PATIENT'S HUSBAND WITH VASECTOMY  Lifestyle  . Physical activity:    Days per week: Not on file    Minutes per session: Not on file  . Stress: Not on file  Relationships  . Social connections:    Talks on phone: Not on file    Gets together: Not on file    Attends religious service: Not on file    Active member of club or organization: Not on file    Attends meetings of clubs or organizations: Not on file    Relationship status: Not on file  Other Topics Concern  . Not on file  Social History Narrative  . Not on file     Family History: The patient's family history includes Alcohol abuse in her father; Alzheimer's disease in her mother; Breast cancer in her cousin, maternal aunt, and maternal aunt; Hyperlipidemia in her mother; Stroke in her father.  ROS:   Please see the history of present illness.     All other systems reviewed and are negative.  EKGs/Labs/Other Studies Reviewed:    The following studies were reviewed today: Notes from Dr. Margaretmary Eddy, carotid US, labs  EKG:  EKG is not ordered today.  The ekg ordered 01/11/2018 demonstrates sinus rhythm with slightly delayed R wave progression with transition in lead V4, otherwise normal tracing.  There are no repolarization abnormalities.  Normal QTC 430 ms  Recent Labs: 12/11/2017: ALT 26; BUN 12; Creat 0.78; Hemoglobin 13.0; Platelets 295; Potassium 5.1; Sodium 138; TSH 1.57  Recent Lipid Panel    Component Value Date/Time   CHOL 209 (H) 05/21/2017 1016   TRIG 114  05/21/2017 1016   HDL 71 05/21/2017 1016   CHOLHDL 2.9 05/21/2017 1016   VLDL 17 11/16/2015 0827   LDLCALC 116 (H) 05/21/2017 1016    Physical Exam:    VS:  BP (!) 162/92   Pulse 88   Ht 5\' 2"  (1.575 m)   Wt 177 lb 3.2 oz (80.4 kg)   SpO2 97%   BMI 32.41 kg/m     Wt Readings from Last 3 Encounters:  01/14/18 177 lb 3.2 oz (80.4 kg)  01/11/18 178 lb (80.7 kg)  12/11/17 177 lb  9.6 oz (80.6 kg)     GEN: Mildly obese, well nourished, well developed in no acute distress HEENT: Normal NECK: No JVD; No carotid bruits LYMPHATICS: No lymphadenopathy CARDIAC: RRR, no murmurs, rubs, gallops RESPIRATORY:  Clear to auscultation without rales, wheezing or rhonchi  ABDOMEN: Soft, non-tender, non-distended MUSCULOSKELETAL:  No edema; No deformity  SKIN: Warm and dry NEUROLOGIC:  Alert and oriented x 3 PSYCHIATRIC:  Normal affect   ASSESSMENT:    1. Precordial chest pain   2. Elevated blood pressure reading   3. Hypercholesteremia   4. Palpitations   5. Dizziness   6. Mild obesity    PLAN:    In order of problems listed above:  1. Chest pressure: Symptoms are atypical, but are clearly causing her concern.  She has very limited coronary risk factors (minimally elevated LDL, possible hypertension).  We will schedule her for a stress echo.  This will also allow measurements of left ventricular wall thickness, which can help guide [place] whether she really needs treatment for hypertension. 2. Elevated BP: She is convinced this is situational.  She would benefit from continuing her recently initiated a program of exercise and weight loss. 3. HLP: Her LDL cholesterol is mildly she has excellent HDL.  She does not require pharmacological therapy, as we discover that she was on disease. 4. Dizziness: It sounds like she is not hydrating well enough before and during exercise.  Also encouraged her to prolong her Coumadin. 5. Palpitations: The one recording she performed with her Apple watch  shows normal rhythm.  Encouraged her to record a few more events and send them to me via [email or web address] for review.   Medication Adjustments/Labs and Tests Ordered: Current medicines are reviewed at length with the patient today.  Concerns regarding medicines are outlined above.  Orders Placed This Encounter  Procedures  . ECHOCARDIOGRAM STRESS TEST   No orders of the defined types were placed in this encounter.   Patient Instructions  Medication Instructions:  Dr Sallyanne Kuster recommends that you continue on your current medications as directed. Please refer to the Current Medication list given to you today.  If you need a refill on your cardiac medications before your next appointment, please call your pharmacy.   Testing/Procedures: Stress Echocardiogram - Your physician has requested that you have a stress echocardiogram. For further information please visit [email or web address]. Please follow instruction sheet as given.  Follow-Up: Dr Sallyanne Kuster recommends that you follow-up with him as needed.    Signed, Sanda Klein, MD  01/16/2018 12:41 PM    [place]

## 2018-01-14 NOTE — Patient Instructions (Signed)
Medication Instructions:  Dr Sallyanne Kuster recommends that you continue on your current medications as directed. Please refer to the Current Medication list given to you today.  If you need a refill on your cardiac medications before your next appointment, please call your pharmacy.   Testing/Procedures: Stress Echocardiogram - Your physician has requested that you have a stress echocardiogram. For further information please visit [email or web address]. Please follow instruction sheet as given.  Follow-Up: Dr Sallyanne Kuster recommends that you follow-up with him as needed.

## 2018-01-21 ENCOUNTER — Encounter: Payer: Self-pay | Admitting: Neurology

## 2018-01-21 ENCOUNTER — Telehealth (HOSPITAL_COMMUNITY): Payer: Self-pay | Admitting: *Deleted

## 2018-01-21 ENCOUNTER — Ambulatory Visit (INDEPENDENT_AMBULATORY_CARE_PROVIDER_SITE_OTHER): Payer: BC Managed Care – PPO | Admitting: Neurology

## 2018-01-21 VITALS — BP 157/85 | HR 87 | Ht 62.0 in | Wt 177.0 lb

## 2018-01-21 DIAGNOSIS — G4719 Other hypersomnia: Secondary | ICD-10-CM | POA: Diagnosis not present

## 2018-01-21 DIAGNOSIS — R51 Headache: Secondary | ICD-10-CM

## 2018-01-21 DIAGNOSIS — R351 Nocturia: Secondary | ICD-10-CM | POA: Diagnosis not present

## 2018-01-21 DIAGNOSIS — R0689 Other abnormalities of breathing: Secondary | ICD-10-CM

## 2018-01-21 DIAGNOSIS — R0683 Snoring: Secondary | ICD-10-CM

## 2018-01-21 DIAGNOSIS — G4761 Periodic limb movement disorder: Secondary | ICD-10-CM

## 2018-01-21 DIAGNOSIS — E669 Obesity, unspecified: Secondary | ICD-10-CM

## 2018-01-21 DIAGNOSIS — G2581 Restless legs syndrome: Secondary | ICD-10-CM

## 2018-01-21 DIAGNOSIS — R519 Headache, unspecified: Secondary | ICD-10-CM

## 2018-01-21 NOTE — Telephone Encounter (Signed)
Patient given detailed instructions per Stress Test Requisition Sheet for test on 01/26/18 at 2:00. Patient Notified to arrive 30 minutes early, and that it is imperative to arrive on time for appointment to keep from having the test rescheduled.  Patient verbalized understanding. Kristin Whitney

## 2018-01-21 NOTE — Patient Instructions (Signed)

## 2018-01-21 NOTE — Progress Notes (Signed)
Subjective:    Patient ID: Kristin Whitney is a 57 y.o. female.  HPI     [name], MD, PhD [facility] [address], [address] [place], [place] [address]  Dear Dr. Dennard Schaumann,   I saw your patient, Kristin Whitney, upon your kind request, in my clinic today for initial consultation of her sleep disorder, in particular, concern for underlying obstructive sleep apnea. The patient is unaccompanied today. As you know, Kristin Whitney is a 57 year old right-handed woman with an underlying medical history of anemia, depression, history of gastritis, anxiety, ADHD and obesity, who reports snoring and excessive daytime somnolence. I reviewed your office note from 01/11/2018. Her Epworth sleepiness score is 8 out of 24 today, fatigue score is 44/63. She is married and lives with her husband, they have 2 children. She works for [employer] system. She quit smoking in the 80s and smoked for another year in the 74s. She drinks alcohol occasionally, coffee 2 cups per day on average and otherwise no significant caffeine intake. She has had difficulty maintaining sleep. She has multiple nighttime awakenings. She has tried Valium for anxiety at night which has been helpful. Nevertheless she still wakes up at night. She has had morning headaches. She has nocturia about once per average night, not aware of any family history of OSA. She has had restless leg symptoms off-and-on, much more pronounced when she was severely anemic in the past. She has even seen hematology for her severe anemia. She had PLM's in the past per husband's report. 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 Bedtime is around 9 and rise time around 5. She does not watch TV in her bedroom, she has a dog in the bedroom but not in bed with her. She has had trouble losing weight. She had recent blood work on 01/11/18, which showed normal B12, neg Lyme, ESR, Vit D lower normal at 41, TSH. Not aware  of a FHx of OSA, has had RLS, especially when she was severely anemic. PLMs were reported per husband in the past too.  Her Past Medical History Is Significant For: Past Medical History:  Diagnosis Date  . ADHD (attention deficit hyperactivity disorder)   . Anemia   . Depression   . Fibrocystic breast changes    left  . Gastritis    egd- 2018 Dr. Collene Mares    Her Past Surgical History Is Significant For: Past Surgical History:  Procedure Laterality Date  . BREAST BIOPSY Left 2012  . GYNECOLOGIC CRYOSURGERY    . NASAL SEPTUM SURGERY  1994  . PELVIC LAPAROSCOPY  12/09/2008   PELVIC WASHINGS, EXCISION OF TORSED LEFT PARATUBULAR CYST. ROLLERBALL ENDOMETRIAL ABLATION    Her Family History Is Significant For: Family History  Problem Relation Age of Onset  . Hyperlipidemia Mother   . Alzheimer's disease Mother   . Breast cancer Maternal Aunt   . Breast cancer Maternal Aunt   . Stroke Father   . Alcohol abuse Father   . Breast cancer Cousin     Her Social History Is Significant For: Social History   Socioeconomic History  . Marital status: Married    Spouse name: Not on file  . Number of children: Not on file  . Years of education: Not on file  . Highest education level: Not on file  Occupational History  . Not on file  Social Needs  . Financial resource strain: Not on file  . Food insecurity:  Worry: Not on file    Inability: Not on file  . Transportation needs:    Medical: Not on file    Non-medical: Not on file  Tobacco Use  . Smoking status: Never Smoker  . Smokeless tobacco: Never Used  Substance and Sexual Activity  . Alcohol use: Yes    Alcohol/week: 0.0 standard drinks    Comment: WINE - rare  . Drug use: No  . Sexual activity: Yes    Birth control/protection: None, Other-see comments    Comment: PATIENT'S HUSBAND WITH VASECTOMY  Lifestyle  . Physical activity:    Days per week: Not on file    Minutes per session: Not on file  . Stress: Not on file   Relationships  . Social connections:    Talks on phone: Not on file    Gets together: Not on file    Attends religious service: Not on file    Active member of club or organization: Not on file    Attends meetings of clubs or organizations: Not on file    Relationship status: Not on file  Other Topics Concern  . Not on file  Social History Narrative  . Not on file    Her Allergies Are:  Allergies  Allergen Reactions  . Clarithromycin   . Codeine Nausea And Vomiting  :   Her Current Medications Are:  Outpatient Encounter Medications as of 01/21/2018  Medication Sig  . ALPRAZolam (XANAX) 0.5 MG tablet Take 1 tablet (0.5 mg total) by mouth at bedtime as needed. Reported on 08/02/2015  . Cholecalciferol (VITAMIN D PO) Take 1 tablet by mouth daily.  . Cyanocobalamin (VITAMIN B-12 PO) Take 1 tablet by mouth daily.  . diazepam (VALIUM) 10 MG tablet Take 1 tablet (10 mg total) by mouth at bedtime as needed for anxiety or sleep.  Marland Kitchen loratadine (CLARITIN) 10 MG tablet Take 10 mg by mouth daily.  Marland Kitchen MILK THISTLE PLUS PO Take by mouth as directed.  . Multiple Vitamin (MULTIVITAMIN) tablet Take 1 tablet by mouth daily.  Marland Kitchen omeprazole (PRILOSEC) 40 MG capsule    No facility-administered encounter medications on file as of 01/21/2018.   :  Review of Systems:  Out of a complete 14 point review of systems, all are reviewed and negative with the exception of these symptoms as listed below: Review of Systems  Neurological:       Pt presents today to discuss her sleep. Pt has never had a sleep study but does endorse snoring.  Epworth Sleepiness Scale 0= would never doze 1= slight chance of dozing 2= moderate chance of dozing 3= high chance of dozing  Sitting and reading: 3 Watching TV: 2 Sitting inactive in a public place (ex. Theater or meeting): 0 As a passenger in a car for an hour without a break: 0 Lying down to rest in the afternoon: 2 Sitting and talking to someone: 0 Sitting  quietly after lunch (no alcohol): 1 In a car, while stopped in traffic: 0 Total: 8     Objective:  Neurological Exam  Physical Exam Physical Examination:   Vitals:   01/21/18 0901  BP: (!) 157/85  Pulse: 87    General Examination: The patient is a very pleasant 57 y.o. female in no acute distress. She appears well-developed and well-nourished and well groomed.   HEENT: Normocephalic, atraumatic, pupils are equal, round and reactive to light and accommodation. Funduscopic exam is normal with sharp disc margins noted. Extraocular tracking is good without limitation  to gaze excursion or nystagmus noted. Normal smooth pursuit is noted. Hearing is grossly intact. Tympanic membranes are clear bilaterally. Face is symmetric with normal facial animation and normal facial sensation. Speech is clear with no dysarthria noted. There is no hypophonia. There is no lip, neck/head, jaw or voice tremor. Neck is supple with full range of passive and active motion. There are no carotid bruits on auscultation. Oropharynx exam reveals: mild mouth dryness, adequate dental hygiene and moderate airway crowding, due to larger tonsils of 2+ and quite small airway. Mallampati is class II. Tongue protrudes centrally and palate elevates symmetrically. Neck size is 14.25 inches. She has a Mild overbite. Nasal inspection reveals no significant nasal mucosal bogginess or redness but mild septal deviation to the L and narrow nasal passages.   Chest: Clear to auscultation without wheezing, rhonchi or crackles noted.  Heart: S1+S2+0, regular and normal without murmurs, rubs or gallops noted.   Abdomen: Soft, non-tender and non-distended with normal bowel sounds appreciated on auscultation.  Extremities: There is no pitting edema in the distal lower extremities bilaterally.   Skin: Warm and dry without trophic changes noted.  Musculoskeletal: exam reveals no obvious joint deformities, tenderness or joint swelling or  erythema.   Neurologically:  Mental status: The patient is awake, alert and oriented in all 4 spheres. Her immediate and remote memory, attention, language skills and fund of knowledge are appropriate. There is no evidence of aphasia, agnosia, apraxia or anomia. Speech is clear with normal prosody and enunciation. Thought process is linear. Mood is normal and affect is normal.  Cranial nerves II - XII are as described above under HEENT exam. In addition: shoulder shrug is normal with equal shoulder height noted. Motor exam: Normal bulk, strength and tone is noted. There is no drift, tremor or rebound. Romberg is negative. Reflexes are 1+ throughout. Fine motor skills and coordination: grossly intact.  Cerebellar testing: No dysmetria or intention tremor on finger to nose testing. Heel to shin is unremarkable bilaterally. There is no truncal or gait ataxia.  Sensory exam: intact to light touch in the upper and lower extremities.  Gait, station and balance: She stands easily. No veering to one side is noted. No leaning to one side is noted. Posture is age-appropriate and stance is narrow based. Gait shows normal stride length and normal pace. No problems turning are noted. Tandem walk is unremarkable.   Assessment and Plan:   In summary, MABREY HOWLAND is a very pleasant 57 y.o.-year old female with an underlying medical history of anemia, depression, history of gastritis, allergies, anxiety, ADHD and obesity, who with a history and physical exam concerning for obstructive sleep apnea (OSA). I had a long chat with the patient about my findings and the diagnosis of OSA, its prognosis and treatment options. We talked about medical treatments, surgical interventions and non-pharmacological approaches. I explained in particular the risks and ramifications of untreated moderate to severe OSA, especially with respect to developing cardiovascular disease down the Road, including congestive heart failure,  difficult to treat hypertension, cardiac arrhythmias, or stroke. Even type 2 diabetes has, in part, been linked to untreated OSA. Symptoms of untreated OSA include daytime sleepiness, memory problems, mood irritability and mood disorder such as depression and anxiety, lack of energy, as well as recurrent headaches, especially morning headaches. We talked about trying to maintain a healthy lifestyle in general, as well as the importance of weight control. I encouraged the patient to eat healthy, exercise daily and keep well  hydrated, to keep a scheduled bedtime and wake time routine, to not skip any meals and eat healthy snacks in between meals. I advised the patient not to drive when feeling sleepy. I recommended the following at this time: sleep study with potential positive airway pressure titration. (We will score hypopneas at 3%).   I explained the sleep test procedure to the patient and also outlined possible surgical and non-surgical treatment options of OSA, including the use of a custom-made dental device (which would require a referral to a specialist dentist or oral surgeon), upper airway surgical options, such as pillar implants, radiofrequency surgery, tongue base surgery, and UPPP (which would involve a referral to an ENT surgeon). Rarely, jaw surgery such as mandibular advancement may be considered.  I also explained the CPAP treatment option to the patient, who indicated that she would be willing to try CPAP if the need arises. I explained the importance of being compliant with PAP treatment, not only for insurance purposes but primarily to improve Her symptoms, and for the patient's long term health benefit, including to reduce Her cardiovascular risks. I answered all her questions today and the patient was in agreement. I would like to see her back after the sleep study is completed and encouraged her to call with any interim questions, concerns, problems or updates.   Thank you very much for  allowing me to participate in the care of this nice patient. If I can be of any further assistance to you please do not hesitate to call me at [phone].  Sincerely,   [name], MD, PhD

## 2018-01-26 ENCOUNTER — Ambulatory Visit (HOSPITAL_COMMUNITY): Payer: BC Managed Care – PPO | Attending: Cardiovascular Disease

## 2018-01-26 ENCOUNTER — Ambulatory Visit (HOSPITAL_BASED_OUTPATIENT_CLINIC_OR_DEPARTMENT_OTHER): Payer: BC Managed Care – PPO

## 2018-01-26 ENCOUNTER — Other Ambulatory Visit: Payer: Self-pay

## 2018-01-26 DIAGNOSIS — R072 Precordial pain: Secondary | ICD-10-CM | POA: Diagnosis not present

## 2018-02-08 ENCOUNTER — Ambulatory Visit
Admission: RE | Admit: 2018-02-08 | Discharge: 2018-02-08 | Disposition: A | Discharge status: Home / Self Care | Payer: BC Managed Care – PPO | Source: Ambulatory Visit | Attending: Family Medicine | Admitting: Family Medicine

## 2018-02-08 DIAGNOSIS — Z1231 Encounter for screening mammogram for malignant neoplasm of breast: Secondary | ICD-10-CM

## 2018-02-09 ENCOUNTER — Encounter: Payer: Self-pay | Admitting: Family Medicine

## 2018-02-11 ENCOUNTER — Other Ambulatory Visit: Payer: Self-pay | Admitting: Family Medicine

## 2018-02-11 MED ORDER — HYDROCHLOROTHIAZIDE 12.5 MG PO CAPS: 12.5000 mg | ORAL_CAPSULE | Freq: Every day | ORAL | 2 refills | Status: DC

## 2018-02-12 ENCOUNTER — Other Ambulatory Visit: Payer: Self-pay | Admitting: Family Medicine

## 2018-02-12 MED ORDER — ALBUTEROL SULFATE HFA 108 (90 BASE) MCG/ACT IN AERS: 2.0000 | INHALATION_SPRAY | Freq: Four times a day (QID) | RESPIRATORY_TRACT | 0 refills | Status: DC | PRN

## 2018-02-16 ENCOUNTER — Encounter: Payer: Self-pay | Admitting: Family Medicine

## 2018-02-16 ENCOUNTER — Ambulatory Visit: Payer: BC Managed Care – PPO | Admitting: Family Medicine

## 2018-02-16 VITALS — BP 156/90 | HR 94 | Temp 98.5°F | Resp 18 | Ht 62.0 in | Wt 181.0 lb

## 2018-02-16 DIAGNOSIS — G4710 Hypersomnia, unspecified: Secondary | ICD-10-CM | POA: Diagnosis not present

## 2018-02-16 DIAGNOSIS — I10 Essential (primary) hypertension: Secondary | ICD-10-CM | POA: Diagnosis not present

## 2018-02-16 DIAGNOSIS — R5382 Chronic fatigue, unspecified: Secondary | ICD-10-CM | POA: Diagnosis not present

## 2018-02-16 NOTE — Progress Notes (Signed)
Subjective:    Patient ID: Kristin Whitney, female    DOB: Jan 08, 1961, 57 y.o.   MRN: [id number]  HPI  05/2017 Patient is a very pleasant 57 year old white female here today for CPE.  Past medical history is significant for anemia, borderline hypertension, depression which was situational, ADHD. She does have a history of degenerative disc disease in her cervical spine the resolved gradually in 2015. Patient had a colonoscopy in 2010. She is not due again until 2020. She sees a gynecologist performs her pelvic exam, Pap smear, and mammogram.  Hepatitis C screening is already up-to-date.  Patient continues to report fatigue.  She continues to report trouble losing weight.  She continues to have dry skin and thinning hair.  She is extremely concerned that there is a problem with her thyroid gland.  Last year, TSH levels, free T3, free T4 levels were all within normal range.  Today she is requesting that we check reverse T3 along with thyroid peroxidase antibody as well as thyroid-stimulating immunoglobulin.  This is based on research that she has done independently as well as discussions with coworkers.  In all honesty, I am not familiar with reverse T3.  I do not believe that she requires thyroid-stimulating immunoglobulin testing as she has no symptoms of Graves' disease.  Thyroid peroxidase antibody is reasonable however if her TSH and T3 and T4 levels are normal I am not sure how we would treat that just due to the presence of an antibody.  I tried to explain that the best I can however the patient is obviously very concerned.  I have not screen the patient for adrenal insufficiency.  At that time, my plan was: Patient's physical exam today is completely normal.  Mammogram was performed in November and is up-to-date.  Pap smears performed by her gynecologist.  Colonoscopy is up-to-date.  Regarding her fatigue, I will check a TSH, T3, and free T4.  In an effort to assuage her fears, I will also  check thyroid peroxidase antibody although we did have a long discussion about its implication particularly if thyroid levels are normal.  I recommended against checking reverse T3 simply due to my ignorance of this test.  I also recommended against checking thyroid-stimulating immunoglobulin based on her clinical exam and symptoms.  I will screen for adrenal insufficiency with a cortisol level.  If all above lab tests are normal, I have recommended consultation with an endocrinologist for a second opinion in an effort to either assuage her concerns or provide her with an answer.  My leading suspicion is perimenopausal symptoms coupled with anxiety.  01/11/18 Patient's lab work in March was completely normal.  She has been battling fatigue off and on for years.  However it has gradually worsened.  Now she reports feeling extremely tired all the time.  She has no energy.  She feels like she is not resting at night.  No matter how long she sleeps, she never feels like she has slept.  She reports a headache in the mornings when she wakes up.  She reports feeling tired all throughout the day.  Her husband states that she does snore however he denies any witnessed apneic episodes.  Today on our encounter, the patient is extremely anxious.  She tends to perseverate over physical symptoms.  Now she requested test for Lyme disease.  Previously she is requested test for thyroid problems, vitamin B-1 deficiency, vitamin D deficiency.  Recently she was seen in clinic by my partner  for palpitations and has been referred to a cardiologist.  She tends to have frequent somatic complaints.  I believe some of this is related to anxiety she agrees.  She states that she never feels like she can sleep her mind simply will not turn off.  She tends to lay in bed worrying about things.  She also tends to think about things from the past.  For instance she states that a few nights ago she was unable to sleep because she was grieving over  her dogs that she had to put down the spring and blaming herself for not doing more.  She denies depression.  She denies any history of bipolar disorder or family history of bipolar disorder.  She does request Lyme titers today because she believes that she is bitten by ticks quite often and may have Lyme disease as a cause of her fatigue.  He also reports hurting all over.  In the past I have been concerned about fibromyalgia.  At that time, my plan was: I had a long discussion today with the patient for more than 30 minutes about her symptoms with her husband.  I believe the majority of her symptoms are related to a combination of not sleeping well, uncontrolled anxiety, and possibly obstructive sleep apnea.  I recommended a sleep study to evaluate for sleep apnea.  I will obtain the Lyme titers at the patient request today in addition to vitamin B12 and a sedimentation rate to evaluate for any underlying autoimmune diseases.  If lab work is normal, I believe we need to focus on controlling her anxiety.  I recommended a therapeutic trial of Valium 10 mg p.o. nightly for 1 to 2 weeks to see if as she sleeps better does her fatigue improved.  If so, I would turn our attention to better long-term management of anxiety and insomnia rather than using habit-forming medication.  However I would like to do this is a test to see if this could improve her fatigue.    02/16/18 Patient has already met with neurology and has a sleep study scheduled for later this month.  He will feel that sleep apnea is playing a large role in her chronic fatigue.  I believe that treating this would help.  Her blood pressure has recently been elevated.  She called in earlier this week and provided values to confirm systolic blood pressure between 140 and 150 on most days.  Therefore we started hydrochlorothiazide 12.5 mg p.o. daily.  Blood pressure has not improved but she is only been on the medication for 2 days.  Sleep apnea is likely  playing a role in this as well as her anxiety.  She is also seeing benefit and taking Valium at night.  She feels like she is sleeping better.  It keeps her mind from perseverating at night.  We had a long discussion today about starting an SSRI such as Zoloft to help her sleep at night and also to help better manage her anxiety.  I explained to the patient that I feel she is suffering from generalized anxiety disorder.  I feel that she has physical symptoms however the anxiety takes hold in amplifies them making this worse.  I believe that this is also acting her quality of life.  However patient is concerned about taking the medication due to potential weight gain.  Past Medical History:  Diagnosis Date  . ADHD (attention deficit hyperactivity disorder)   . Anemia   . Depression   .  Fibrocystic breast changes    left  . Gastritis    egd- 2018 Dr. Collene Mares   Past Surgical History:  Procedure Laterality Date  . BREAST BIOPSY Left 2012  . GYNECOLOGIC CRYOSURGERY    . NASAL SEPTUM SURGERY  1994  . PELVIC LAPAROSCOPY  12/09/2008   PELVIC WASHINGS, EXCISION OF TORSED LEFT PARATUBULAR CYST. ROLLERBALL ENDOMETRIAL ABLATION   Current Outpatient Medications on File Prior to Visit  Medication Sig Dispense Refill  . albuterol (PROVENTIL HFA;VENTOLIN HFA) 108 (90 Base) MCG/ACT inhaler Inhale 2 puffs into the lungs every 6 (six) hours as needed for wheezing or shortness of breath. 1 Inhaler 0  . ALPRAZolam (XANAX) 0.5 MG tablet Take 1 tablet (0.5 mg total) by mouth at bedtime as needed. Reported on 08/02/2015 30 tablet 2  . Cholecalciferol (VITAMIN D PO) Take 1 tablet by mouth daily.    . Cyanocobalamin (VITAMIN B-12 PO) Take 1 tablet by mouth daily.    . diazepam (VALIUM) 10 MG tablet Take 1 tablet (10 mg total) by mouth at bedtime as needed for anxiety or sleep. 30 tablet 1  . hydrochlorothiazide (MICROZIDE) 12.5 MG capsule Take 1 capsule (12.5 mg total) by mouth daily. 30 capsule 2  . loratadine  (CLARITIN) 10 MG tablet Take 10 mg by mouth daily.    Marland Kitchen MILK THISTLE PLUS PO Take by mouth as directed.    . Multiple Vitamin (MULTIVITAMIN) tablet Take 1 tablet by mouth daily.    Marland Kitchen omeprazole (PRILOSEC) 40 MG capsule      No current facility-administered medications on file prior to visit.    Allergies  Allergen Reactions  . Clarithromycin   . Codeine Nausea And Vomiting   Social History   Socioeconomic History  . Marital status: Married    Spouse name: Not on file  . Number of children: Not on file  . Years of education: Not on file  . Highest education level: Not on file  Occupational History  . Not on file  Social Needs  . Financial resource strain: Not on file  . Food insecurity:    Worry: Not on file    Inability: Not on file  . Transportation needs:    Medical: Not on file    Non-medical: Not on file  Tobacco Use  . Smoking status: Never Smoker  . Smokeless tobacco: Never Used  Substance and Sexual Activity  . Alcohol use: Yes    Alcohol/week: 0.0 standard drinks    Comment: WINE - rare  . Drug use: No  . Sexual activity: Yes    Birth control/protection: None, Other-see comments    Comment: PATIENT'S HUSBAND WITH VASECTOMY  Lifestyle  . Physical activity:    Days per week: Not on file    Minutes per session: Not on file  . Stress: Not on file  Relationships  . Social connections:    Talks on phone: Not on file    Gets together: Not on file    Attends religious service: Not on file    Active member of club or organization: Not on file    Attends meetings of clubs or organizations: Not on file    Relationship status: Not on file  . Intimate partner violence:    Fear of current or ex partner: Not on file    Emotionally abused: Not on file    Physically abused: Not on file    Forced sexual activity: Not on file  Other Topics Concern  . Not on file  Social History Narrative  . Not on file   Family History  Problem Relation Age of Onset  .  Hyperlipidemia Mother   . Alzheimer's disease Mother   . Breast cancer Maternal Aunt   . Breast cancer Maternal Aunt   . Stroke Father   . Alcohol abuse Father   . Breast cancer Cousin       Review of Systems  All other systems reviewed and are negative.      Objective:   Physical Exam  Constitutional: She is oriented to person, place, and time. She appears well-developed and well-nourished. No distress.  HENT:  Head: Normocephalic and atraumatic.  Right Ear: External ear normal.  Left Ear: External ear normal.  Nose: Nose normal.  Mouth/Throat: Oropharynx is clear and moist. No oropharyngeal exudate.  Eyes: Pupils are equal, round, and reactive to light. Conjunctivae and EOM are normal. Right eye exhibits no discharge. Left eye exhibits no discharge. No scleral icterus.  Neck: Normal range of motion. Neck supple. No JVD present. No tracheal deviation present. No thyromegaly present.  Cardiovascular: Normal rate, regular rhythm, normal heart sounds and intact distal pulses. Exam reveals no gallop and no friction rub.  No murmur heard. Pulmonary/Chest: Effort normal and breath sounds normal. No stridor. No respiratory distress. She has no wheezes. She has no rales. She exhibits no tenderness.  Abdominal: Soft. Bowel sounds are normal. She exhibits no distension and no mass. There is no tenderness. There is no rebound and no guarding.  Musculoskeletal: Normal range of motion. She exhibits no edema, tenderness or deformity.  Lymphadenopathy:    She has no cervical adenopathy.  Neurological: She is alert and oriented to person, place, and time. She has normal reflexes. No cranial nerve deficit. She exhibits normal muscle tone. Coordination normal.  Skin: Skin is warm. No rash noted. She is not diaphoretic. No erythema. No pallor.  Psychiatric: She has a normal mood and affect. Her behavior is normal. Judgment and thought content normal.  Vitals reviewed.         Assessment &  Plan:  Chronic fatigue  Hypersomnolence  Benign essential HTN  I believe her chronic fatigue and hypersomnolence are multifactorial.  I believe sleep apnea is playing a large role in this.  Hopefully she will be able to treat this with CPAP if my suspicions are confirmed by her sleep study.  However also believe that anxiety is playing a large role in this as well.  I suggested starting Zoloft on a daily basis to improve her sleep and also help better control her anxiety hopefully.  Patient is hesitant to do this at this time due to concerns over potential weight gain.  Therefore she would like to try treating sleep apnea if in fact this is present and also exercising and controlling her blood pressure prior to adding any additional medication.  I believe this is reasonable.  However if the anxiety worsens, I would strongly suggest an SSRI such as Zoloft or if the patient is concerned about weight gain, we could even consider Trintellix.

## 2018-03-01 ENCOUNTER — Ambulatory Visit (INDEPENDENT_AMBULATORY_CARE_PROVIDER_SITE_OTHER): Payer: BC Managed Care – PPO | Admitting: Neurology

## 2018-03-01 DIAGNOSIS — R0689 Other abnormalities of breathing: Secondary | ICD-10-CM

## 2018-03-01 DIAGNOSIS — R519 Headache, unspecified: Secondary | ICD-10-CM

## 2018-03-01 DIAGNOSIS — R51 Headache: Secondary | ICD-10-CM

## 2018-03-01 DIAGNOSIS — E669 Obesity, unspecified: Secondary | ICD-10-CM

## 2018-03-01 DIAGNOSIS — G4761 Periodic limb movement disorder: Secondary | ICD-10-CM

## 2018-03-01 DIAGNOSIS — G2581 Restless legs syndrome: Secondary | ICD-10-CM

## 2018-03-01 DIAGNOSIS — G4733 Obstructive sleep apnea (adult) (pediatric): Secondary | ICD-10-CM

## 2018-03-01 DIAGNOSIS — R0683 Snoring: Secondary | ICD-10-CM

## 2018-03-01 DIAGNOSIS — G4720 Circadian rhythm sleep disorder, unspecified type: Secondary | ICD-10-CM

## 2018-03-01 DIAGNOSIS — G4719 Other hypersomnia: Secondary | ICD-10-CM

## 2018-03-01 DIAGNOSIS — R351 Nocturia: Secondary | ICD-10-CM

## 2018-03-08 ENCOUNTER — Other Ambulatory Visit: Payer: Self-pay | Admitting: Family Medicine

## 2018-03-10 HISTORY — PX: FINGER SURGERY: SHX640

## 2018-03-11 ENCOUNTER — Telehealth: Payer: Self-pay

## 2018-03-11 NOTE — Addendum Note (Signed)
Addended by: [name] on: 03/11/2018 07:37 AM   Modules accepted: Orders

## 2018-03-11 NOTE — Progress Notes (Signed)
Patient referred by Dr. Dennard Schaumann, seen by me on 01/21/18, diagnostic PSG on 03/01/18.   Please call and notify the patient that the recent sleep study showed moderate to severe obstructive sleep apnea, with a total AHI of 21.8/hour, REM AHI of 39.1/hour, supine AHI of 45.3/hour and O2 nadir of 82%. I recommend treatment for this in the form of CPAP. This will require a repeat sleep study for proper titration and mask fitting and correct monitoring of the oxygen saturations. Please explain to patient. I have placed an order in the chart. Thanks.  [name], MD, PhD [facility])

## 2018-03-11 NOTE — Telephone Encounter (Signed)
-----   Message from [name], MD sent at 03/11/2018  7:37 AM EST ----- Patient referred by Dr. Dennard Schaumann, seen by me on 01/21/18, diagnostic PSG on 03/01/18.   Please call and notify the patient that the recent sleep study showed moderate to severe obstructive sleep apnea, with a total AHI of 21.8/hour, REM AHI of 39.1/hour, supine AHI of 45.3/hour and O2 nadir of 82%. I recommend treatment for this in the form of CPAP. This will require a repeat sleep study for proper titration and mask fitting and correct monitoring of the oxygen saturations. Please explain to patient. I have placed an order in the chart. Thanks.  [name], MD, PhD [facility])

## 2018-03-11 NOTE — Procedures (Signed)
PATIENT'S NAME:  Kristin Whitney, Kristin Whitney DOB:      [birth year]      MR#:    [id number]     DATE OF RECORDING: 03/01/2018 REFERRING M.D.:  Jenna Luo, MD Study Performed:   Baseline Polysomnogram HISTORY: 58 year old woman with a history of anemia, depression, history of gastritis, anxiety, ADHD and obesity, who reports snoring and excessive daytime somnolence. Her Epworth sleepiness score is 8 out of 24. The patient's weight 177 pounds with a height of 62 (inches), resulting in a BMI of 32.5 kg/m2. The patient's neck circumference measured 14.2 inches.  CURRENT MEDICATIONS: Xanax, Vitamin D, Vitamin B12, Valium, Claritin, Multivitamin, Prilosec.   PROCEDURE:  This is a multichannel digital polysomnogram utilizing the Somnostar 11.2 system.  Electrodes and sensors were applied and monitored per AASM Specifications.   EEG, EOG, Chin and Limb EMG, were sampled at 200 Hz.  ECG, Snore and Nasal Pressure, Thermal Airflow, Respiratory Effort, CPAP Flow and Pressure, Oximetry was sampled at 50 Hz. Digital video and audio were recorded.      BASELINE STUDY  Lights Out was at 00:03 and Lights On at 07:43.  Total recording time (TRT) was 461 minutes, with a total sleep time (TST) of 424 minutes.   The patient's sleep latency was 2.5 minutes. REM latency was 165 minutes, which is delayed. The sleep efficiency was 92. %.     SLEEP ARCHITECTURE: WASO (Wake after sleep onset) was 33.5 minutes with mild sleep fragmentation noted. There were 2 minutes in Stage N1, 342.5 minutes Stage N2, 10.5 minutes Stage N3 and 69 minutes in Stage REM.  The percentage of Stage N1 was .5%, Stage N2 was 80.8%, which is markedly increased, Stage N3 was 2.5% and Stage R (REM sleep) was 16.3%, which is mildly reduced. The arousals were noted as: 33 were spontaneous, 0 were associated with PLMs, 18 were associated with respiratory events.  RESPIRATORY ANALYSIS:  There were a total of 154 respiratory events:  1 obstructive apneas, 1 central  apneas and 0 mixed apneas with a total of 2 apneas and an apnea index (AI) of .3 /hour. There were 152 hypopneas with a hypopnea index of 21.5 /hour. The patient also had 0 respiratory event related arousals (RERAs).      The total APNEA/HYPOPNEA INDEX (AHI) was 21.8/hour and the total RESPIRATORY DISTURBANCE INDEX was 21.8 /hour.  45 events occurred in REM sleep and 218 events in NREM. The REM AHI was 39.1 /hour, versus a non-REM AHI of 18.4. The patient spent 141.5 minutes of total sleep time in the supine position and 283 minutes in non-supine. The supine AHI was 45.3 versus a non-supine AHI of 10.0.  OXYGEN SATURATION & C02:  The Wake baseline 02 saturation was 92%, with the lowest being 82%. Time spent below 89% saturation equaled 28 minutes.  PERIODIC LIMB MOVEMENTS: The patient had a total of 0 Periodic Limb Movements.  The Periodic Limb Movement (PLM) index was 0 and the PLM Arousal index was 0/hour. Audio and video analysis did not show any abnormal or unusual movements, behaviors, phonations or vocalizations. The patient took no bathroom breaks. Mild to moderate snoring was noted. The EKG was in keeping with normal sinus rhythm (NSR).  Post-study, the patient indicated that sleep was better than usual.   IMPRESSION:  1. Obstructive Sleep Apnea (OSA) 2. Dysfunctions associated with sleep stages or arousal from sleep  RECOMMENDATIONS:  1. This study demonstrates moderate to severe obstructive sleep apnea, with a total AHI of  21.8/hour, REM AHI of 39.1/hour, supine AHI of 45.3/hour and O2 nadir of 82%. Treatment with positive airway pressure in the form of CPAP is recommended. This will require a full night titration study to optimize therapy. Other treatment options may include avoidance of supine sleep position along with weight loss, upper airway or jaw surgery in selected patients or the use of an oral appliance in certain patients. ENT evaluation and/or consultation with a maxillofacial  surgeon or dentist may be feasible in some instances.    2. Please note that untreated obstructive sleep apnea may carry additional perioperative morbidity. Patients with significant obstructive sleep apnea should receive perioperative PAP therapy and the surgeons and particularly the anesthesiologist should be informed of the diagnosis and the severity of the sleep disordered breathing. 3. This study shows sleep fragmentation and abnormal sleep stage percentages; these are nonspecific findings and per se do not signify an intrinsic sleep disorder or a cause for the patient's sleep-related symptoms. Causes include (but are not limited to) the first night effect of the sleep study, circadian rhythm disturbances, medication effect or an underlying mood disorder or medical problem.  4. The patient should be cautioned not to drive, work at heights, or operate dangerous or heavy equipment when tired or sleepy. Review and reiteration of good sleep hygiene measures should be pursued with any patient. 5. The patient will be seen in follow-up in the sleep clinic at [facility] for discussion of the test results, symptom and treatment compliance review, further management strategies, etc. The referring provider will be notified of the test results.  I certify that I have reviewed the entire raw data recording prior to the issuance of this report in accordance with the Standards of Accreditation of the American Academy of Sleep Medicine (AASM)   [name], MD, PhD Diplomat, American Board of Neurology and Sleep Medicine (Neurology and Sleep Medicine)

## 2018-03-11 NOTE — Telephone Encounter (Signed)

## 2018-03-15 ENCOUNTER — Encounter

## 2018-03-15 ENCOUNTER — Institutional Professional Consult (permissible substitution): Payer: BC Managed Care – PPO | Admitting: Neurology

## 2018-03-18 ENCOUNTER — Ambulatory Visit (INDEPENDENT_AMBULATORY_CARE_PROVIDER_SITE_OTHER): Payer: BC Managed Care – PPO | Admitting: Neurology

## 2018-03-18 DIAGNOSIS — R519 Headache, unspecified: Secondary | ICD-10-CM

## 2018-03-18 DIAGNOSIS — G4733 Obstructive sleep apnea (adult) (pediatric): Secondary | ICD-10-CM

## 2018-03-18 DIAGNOSIS — G4720 Circadian rhythm sleep disorder, unspecified type: Secondary | ICD-10-CM

## 2018-03-18 DIAGNOSIS — R351 Nocturia: Secondary | ICD-10-CM

## 2018-03-18 DIAGNOSIS — E669 Obesity, unspecified: Secondary | ICD-10-CM

## 2018-03-18 DIAGNOSIS — R51 Headache: Secondary | ICD-10-CM

## 2018-03-18 DIAGNOSIS — G4719 Other hypersomnia: Secondary | ICD-10-CM

## 2018-03-22 ENCOUNTER — Telehealth: Payer: Self-pay

## 2018-03-22 NOTE — Telephone Encounter (Signed)
I called pt. I advised pt that Dr. Rexene Alberts reviewed their sleep study results and found that pt did fairly well with the cpap during her latest sleep study. Dr. Rexene Alberts recommends that pt start a cpap at home. I reviewed PAP compliance expectations with the pt. Pt is agreeable to starting a CPAP. I advised pt that an order will be sent to a DME, [facility], and [facility] will call the pt within about one week after they file with the pt's insurance. [facility] will show the pt how to use the machine, fit for masks, and troubleshoot the CPAP if needed. A follow up appt was made for insurance purposes with Dr. Rexene Alberts on 06/09/18 at 8:30am. Pt verbalized understanding to arrive 15 minutes early and bring their CPAP. A letter with all of this information in it will be mailed to the pt as a reminder. I verified with the pt that the address we have on file is correct. Pt verbalized understanding of results. Pt had no questions at this time but was encouraged to call back if questions arise. I have sent the order to [facility] and have received confirmation that they have received the order.

## 2018-03-22 NOTE — Addendum Note (Signed)
Addended by: [name] on: 03/22/2018 08:42 AM   Modules accepted: Orders

## 2018-03-22 NOTE — Telephone Encounter (Signed)
-----   Message from [name], MD sent at 03/22/2018  8:42 AM EST ----- Patient referred by Dr. Dennard Schaumann, seen by me on 01/21/18, diagnostic PSG on 03/01/18. Patient had a CPAP titration study on 03/18/18.  Please call and inform patient that I have entered an order for treatment with positive airway pressure (PAP) treatment for obstructive sleep apnea (OSA). She did fairly well during the latest sleep study with CPAP. We will, therefore, arrange for a machine for home use through a DME (durable medical equipment) company of Her choice; and I will see the patient back in follow-up in about 10 weeks. Please also explain to the patient that I will be looking out for compliance data, which can be downloaded from the machine (stored on an SD card, that is inserted in the machine) or via remote access through a modem, that is built into the machine. At the time of the followup appointment we will discuss sleep study results and how it is going with PAP treatment at home. Please advise patient to bring Her machine at the time of the first FU visit, even though this is cumbersome. Bringing the machine for every visit after that will likely not be needed, but often helps for the first visit to troubleshoot if needed. Please re-enforce the importance of compliance with treatment and the need for [place] to monitor compliance data - often an insurance requirement and actually good feedback for the patient as far as how they are doing.  Also remind patient, that any interim PAP machine or mask issues should be first addressed with the DME company, as they can often help better with technical and mask fit issues. Please ask if patient has a preference regarding DME company.  Please also make sure, the patient has a follow-up appointment with me in about 10 weeks from the setup date, thanks. May see one of our nurse practitioners if needed for proper timing of the FU appointment.  Please fax or rout report to the referring  provider. Thanks,   [name], MD, PhD [facility])

## 2018-03-22 NOTE — Progress Notes (Signed)
Patient referred by Dr. Dennard Schaumann, seen by me on 01/21/18, diagnostic PSG on 03/01/18. Patient had a CPAP titration study on 03/18/18.  Please call and inform patient that I have entered an order for treatment with positive airway pressure (PAP) treatment for obstructive sleep apnea (OSA). She did fairly well during the latest sleep study with CPAP. We will, therefore, arrange for a machine for home use through a DME (durable medical equipment) company of Her choice; and I will see the patient back in follow-up in about 10 weeks. Please also explain to the patient that I will be looking out for compliance data, which can be downloaded from the machine (stored on an SD card, that is inserted in the machine) or via remote access through a modem, that is built into the machine. At the time of the followup appointment we will discuss sleep study results and how it is going with PAP treatment at home. Please advise patient to bring Her machine at the time of the first FU visit, even though this is cumbersome. Bringing the machine for every visit after that will likely not be needed, but often helps for the first visit to troubleshoot if needed. Please re-enforce the importance of compliance with treatment and the need for [place] to monitor compliance data - often an insurance requirement and actually good feedback for the patient as far as how they are doing.  Also remind patient, that any interim PAP machine or mask issues should be first addressed with the DME company, as they can often help better with technical and mask fit issues. Please ask if patient has a preference regarding DME company.  Please also make sure, the patient has a follow-up appointment with me in about 10 weeks from the setup date, thanks. May see one of our nurse practitioners if needed for proper timing of the FU appointment.  Please fax or rout report to the referring provider. Thanks,   [name], MD, PhD [facility]  [facility])

## 2018-03-22 NOTE — Procedures (Signed)
PATIENT'S NAME:  Kristin Whitney, Kristin Whitney DOB:      [birth year]      MR#:    [id number]     DATE OF RECORDING: 03/18/2018 REFERRING M.D.:  Jenna Luo, MD Study Performed:   CPAP  Titration HISTORY: 58 year old woman with a history of anemia, depression, history of gastritis, anxiety, ADHD and obesity, who presents for a titration study. Her diagnostic PSG on 03/01/18 showed moderate to severe obstructive sleep apnea, with a total AHI of 21.8/hour, REM AHI of 39.1/hour, supine AHI of 45.3/hour and O2 nadir of 82%. The patient's weight 176 pounds with a height of 62 (inches), resulting in a BMI of 32.5 kg/m2. The patient's neck circumference measured 14.25 inches.  CURRENT MEDICATIONS: Xanax, Vitamin D, Vitamin B12, Valium, Claritin, Multivitamin, Prilosec.  PROCEDURE:  This is a multichannel digital polysomnogram utilizing the SomnoStar 11.2 system.  Electrodes and sensors were applied and monitored per AASM Specifications.   EEG, EOG, Chin and Limb EMG, were sampled at 200 Hz.  ECG, Snore and Nasal Pressure, Thermal Airflow, Respiratory Effort, CPAP Flow and Pressure, Oximetry was sampled at 50 Hz. Digital video and audio were recorded.      The patient was fitted with a nasal mask and FFM and preferred the latter; Simplus small by F&P. CPAP was initiated at 5 cmH20 with heated humidity per AASM standards and pressure was advanced to 18 cmH20 because of hypopneas, apneas and desaturations. At a PAP pressure of 17 cmH20, there was a reduction of the AHI to 8.6/hour with supine REM sleep achieved and O2 nadir of 94%.   Lights Out was at 21:19 and Lights On at 05:18. Total recording time (TRT) was 480 minutes, with a total sleep time (TST) of 421.5 minutes. The patient's sleep latency was 13.5 minutes. REM latency was 159 minutes, which is delayed. The sleep efficiency was 87.8 %.    SLEEP ARCHITECTURE: WASO (Wake after sleep onset) was 58 minutes with mild to moderate sleep fragmentation noted. There were 52  minutes in Stage N1, 258.5 minutes Stage N2, 25 minutes Stage N3 and 86 minutes in Stage REM.  The percentage of Stage N1 was 12.3%, which is increased, Stage N2 was 61.3%, which is increased, Stage N3 was 5.9% and Stage R (REM sleep) was 20.4%, which is normal.   RESPIRATORY ANALYSIS:  There was a total of 36 respiratory events: 0 obstructive apneas, 0 central apneas and 0 mixed apneas with a total of 0 apneas and an apnea index (AI) of 0 /hour. There were 36 hypopneas with a hypopnea index of 5.1/hour. The patient also had 0 respiratory event related arousals (RERAs).      The total APNEA/HYPOPNEA INDEX (AHI) was 5.1 /hour and the total RESPIRATORY DISTURBANCE INDEX was 5.1 /hour  6 events occurred in REM sleep and 30 events in NREM. The REM AHI was 4.2 /hour versus a non-REM AHI of 5.4 /hour.  The patient spent 80.5 minutes of total sleep time in the supine position and 341 minutes in non-supine. The supine AHI was 15.7, versus a non-supine AHI of 2.6.  OXYGEN SATURATION & C02:  The baseline 02 saturation was 98%, with the lowest being 90%. Time spent below 89% saturation equaled 0 minutes. PERIODIC LIMB MOVEMENTS:  The patient had a total of 0 Periodic Limb Movements. The Periodic Limb Movement (PLM) index was 0 and the PLM Arousal index was 0 /hour.   Audio and video analysis did not show any abnormal or unusual movements,  behaviors, phonations or vocalizations. The patient took 2 bathroom breaks. The EKG was in keeping with normal sinus rhythm.  Post-study, the patient indicated that sleep was better than usual.   IMPRESSION:   1. Obstructive Sleep Apnea (OSA) 2. Dysfunctions associated with sleep stages or arousal from sleep   RECOMMENDATIONS:   1. This study demonstrates reduction but not full resolution of the patient's obstructive sleep apnea with CPAP therapy. Nevertheless, I will start the patient on home CPAP treatment at a pressure of 17 cm via small FFM with heated humidity. The  patient should be reminded to be fully compliant with PAP therapy to improve sleep related symptoms and decrease long term cardiovascular risks. The patient should be reminded, that it may take up to 3 months to get fully used to using PAP with all planned sleep. The earlier full compliance is achieved, the better long term compliance tends to be. Please note that untreated obstructive sleep apnea may carry additional perioperative morbidity. Patients with significant obstructive sleep apnea should receive perioperative PAP therapy and the surgeons and particularly the anesthesiologist should be informed of the diagnosis and the severity of the sleep disordered breathing. 2. This study shows sleep fragmentation and abnormal sleep stage percentages; these are nonspecific findings and per se do not signify an intrinsic sleep disorder or a cause for the patient's sleep-related symptoms. Causes include (but are not limited to) the first night effect of the sleep study, circadian rhythm disturbances, medication effect or an underlying mood disorder or medical problem.  3. The patient should be cautioned not to drive, work at heights, or operate dangerous or heavy equipment when tired or sleepy. Review and reiteration of good sleep hygiene measures should be pursued with any patient. 4. The patient will be seen in follow-up in the sleep clinic at [facility] for discussion of the test results, symptom and treatment compliance review, further management strategies, etc. The referring provider will be notified of the test results.   I certify that I have reviewed the entire raw data recording prior to the issuance of this report in accordance with the Standards of Accreditation of the American Academy of Sleep Medicine (AASM)     [name], MD, PhD Diplomat, American Board of Neurology and Sleep Medicine (Neurology and Sleep Medicine)

## 2018-04-05 ENCOUNTER — Other Ambulatory Visit: Payer: Self-pay | Admitting: Family Medicine

## 2018-04-05 NOTE — Telephone Encounter (Signed)
Ok to refill??  Last office visit 02/16/2018.  Last refill 05/21/2017, #2 refills.

## 2018-05-05 ENCOUNTER — Encounter: Payer: Self-pay | Admitting: Neurology

## 2018-05-06 ENCOUNTER — Telehealth: Payer: Self-pay | Admitting: Neurology

## 2018-05-06 DIAGNOSIS — G4733 Obstructive sleep apnea (adult) (pediatric): Secondary | ICD-10-CM

## 2018-05-06 DIAGNOSIS — Z789 Other specified health status: Secondary | ICD-10-CM

## 2018-05-06 NOTE — Telephone Encounter (Signed)
I reviewed her current 30 day CPAP compliance data from 04/06/2018 through 05/05/2018, during which time she used her machine 29 days with percent used days greater than 4 hours and 70%, indicating adequate compliance with an average usage of 4 hours and 14 minutes, residual AHI at goal at 2.2 per hour, leaked low with the 95th percentile at 3.6 L/m on a pressure of 17 cm with EPR of 3. Please call patient back. For better tolerance and reducing her bloating, we can certainly reduce the pressure to 15 cm at this time. Please advise her that she did need quite a bit of pressure during the titration study and was titrated up to 18 cm at the time. We can certainly try to see if we can get away with a smaller pressure. Everything else looks very good, she is commended for being compliant with treatment.

## 2018-05-06 NOTE — Telephone Encounter (Signed)
Pt said it taking a long to get used to CPAP. She is waking during the night burping, feels like her lips are moving, tired when she wakes. She is thinking the pressure is too high as titration was set at 12 but her machine is set at 17. Please call to advise

## 2018-05-07 ENCOUNTER — Other Ambulatory Visit: Payer: Self-pay | Admitting: *Deleted

## 2018-05-07 MED ORDER — HYDROCHLOROTHIAZIDE 12.5 MG PO CAPS: 12.5000 mg | ORAL_CAPSULE | Freq: Every day | ORAL | 2 refills | Status: DC

## 2018-05-07 NOTE — Telephone Encounter (Signed)
I was able to call and speak with the pt regarding Dr. Guadelupe Sabin recommendations. Pt was agreeable to trying the 15 cm pressure setting at this time. I advised the pt I would fax the new order the [facility] ( F # [phone]) and pt voiced appreciation. Pt was advised to call back if she had any further questions/concerns regarding her CPAP.  Order faxed to number above and confirmation received.

## 2018-05-14 ENCOUNTER — Encounter: Payer: Self-pay | Admitting: Cardiovascular Disease

## 2018-05-14 ENCOUNTER — Ambulatory Visit: Payer: BC Managed Care – PPO | Admitting: Cardiovascular Disease

## 2018-05-14 VITALS — BP 144/90 | HR 75 | Ht 62.0 in | Wt 165.2 lb

## 2018-05-14 DIAGNOSIS — R0789 Other chest pain: Secondary | ICD-10-CM

## 2018-05-14 DIAGNOSIS — R030 Elevated blood-pressure reading, without diagnosis of hypertension: Secondary | ICD-10-CM | POA: Diagnosis not present

## 2018-05-14 DIAGNOSIS — E7800 Pure hypercholesterolemia, unspecified: Secondary | ICD-10-CM

## 2018-05-14 DIAGNOSIS — R002 Palpitations: Secondary | ICD-10-CM

## 2018-05-14 DIAGNOSIS — R42 Dizziness and giddiness: Secondary | ICD-10-CM

## 2018-05-14 NOTE — Patient Instructions (Signed)
Medication Instructions:  Continue same medications If you need a refill on your cardiac medications before your next appointment, please call your pharmacy.   Lab work: None ordered   Testing/Procedures: None ordered Follow-Up: At [facility], you and your health needs are our priority.  As part of our continuing mission to provide you with exceptional heart care, we have created designated Provider Care Teams.  These Care Teams include your primary Cardiologist (physician) and Advanced Practice Providers (APPs -  Physician Assistants and Nurse Practitioners) who all work together to provide you with the care you need, when you need it. . Follow up appointment with Dr.Croitoru in 6 months  Call 3 months before to schedule

## 2018-05-14 NOTE — Progress Notes (Signed)
Cardiology Office Note:    Date:  05/15/2018   ID:  Kristin Whitney, DOB Sep 26, 1960, MRN [id number]  PCP:  Susy Frizzle, MD  Cardiologist:   Electrophysiologist:  None   Referring MD: Susy Frizzle, MD   Chief Complaint  Patient presents with  . Follow-up    stress echo   History of Present Illness:    Kristin Whitney is a 58 y.o. female with recent complaints of dizziness and palpitations on her exercise as well as atypical chest discomfort.  She returns in follow-up after undergoing a stress echo and starting treatment with CPAP for recently diagnosed OSA.  Her stress echo showed normal findings.  Her complaints of dizziness improved drastically once she started hydrating better.  She has had some difficulty adjusting to CPAP due to the high pressure point with some minor adjustments she tolerates it well and is aware of marked improvement in energy level, stamina and a reduction in daytime somnolence.  She did not complain of any chest discomfort today.  She is concerned that her blood pressure remains mildly elevated, typically in the 140s/high 80s.  She has been working hard on sodium restriction and a healthier diet.  She has already lost 16 pounds since December.  She had an electrocardiogram in October 4 did show some poor R wave progression and she was referred for cardiology evaluation.  Her stress echocardiogram showed borderline LVH but was otherwise a normal study.  She did not have a complete echo so diastolic function was not assessed, but the mitral inflow pattern is normal and the left atrium is not dilated  Past Medical History:  Diagnosis Date  . ADHD (attention deficit hyperactivity disorder)   . Anemia   . Depression   . Fibrocystic breast changes    left  . Gastritis    egd- 2018 Dr. Collene Mares    Past Surgical History:  Procedure Laterality Date  . BREAST BIOPSY Left 2012  . GYNECOLOGIC CRYOSURGERY    . NASAL SEPTUM SURGERY  1994    . PELVIC LAPAROSCOPY  12/09/2008   PELVIC WASHINGS, EXCISION OF TORSED LEFT PARATUBULAR CYST. ROLLERBALL ENDOMETRIAL ABLATION    Current Medications: Current Meds  Medication Sig  . albuterol (PROVENTIL HFA;VENTOLIN HFA) 108 (90 Base) MCG/ACT inhaler TAKE 2 PUFFS INTO LUNGS EVERY 6 HOURS AS NEEDED FOR WHEEZE OR SHORTNESS OF BREATH  . ALPRAZolam (XANAX) 0.5 MG tablet TAKE 1 TABLET (0.5 MG TOTAL) BY MOUTH AT BEDTIME AS NEEDED. REPORTED ON 08/02/2015  . Cholecalciferol (VITAMIN D PO) Take 1 tablet by mouth daily.  . Cyanocobalamin (VITAMIN B-12 PO) Take 1 tablet by mouth daily.  . diazepam (VALIUM) 10 MG tablet Take 1 tablet (10 mg total) by mouth at bedtime as needed for anxiety or sleep.  . hydrochlorothiazide (MICROZIDE) 12.5 MG capsule Take 1 capsule (12.5 mg total) by mouth daily.  Marland Kitchen loratadine (CLARITIN) 10 MG tablet Take 10 mg by mouth daily.  Marland Kitchen MILK THISTLE PLUS PO Take by mouth as directed.  . Multiple Vitamin (MULTIVITAMIN) tablet Take 1 tablet by mouth daily.  Marland Kitchen omeprazole (PRILOSEC) 40 MG capsule      Allergies:   Clarithromycin and Codeine   Social History   Socioeconomic History  . Marital status: Married    Spouse name: Not on file  . Number of children: Not on file  . Years of education: Not on file  . Highest education level: Not on file  Occupational History  . Not on file  Social Needs  . Financial resource strain: Not on file  . Food insecurity:    Worry: Not on file    Inability: Not on file  . Transportation needs:    Medical: Not on file    Non-medical: Not on file  Tobacco Use  . Smoking status: Never Smoker  . Smokeless tobacco: Never Used  Substance and Sexual Activity  . Alcohol use: Yes    Alcohol/week: 0.0 standard drinks    Comment: WINE - rare  . Drug use: No  . Sexual activity: Yes    Birth control/protection: None, Other-see comments    Comment: PATIENT'S HUSBAND WITH VASECTOMY  Lifestyle  . Physical activity:    Days per week: Not on  file    Minutes per session: Not on file  . Stress: Not on file  Relationships  . Social connections:    Talks on phone: Not on file    Gets together: Not on file    Attends religious service: Not on file    Active member of club or organization: Not on file    Attends meetings of clubs or organizations: Not on file    Relationship status: Not on file  Other Topics Concern  . Not on file  Social History Narrative  . Not on file     Family History: The patient's family history includes Alcohol abuse in her father; Alzheimer's disease in her mother; Breast cancer in her cousin, maternal aunt, and maternal aunt; Hyperlipidemia in her mother; Stroke in her father.  ROS:   Please see the history of present illness.     All other systems reviewed and are negative.  EKGs/Labs/Other Studies Reviewed:    The following studies were reviewed today: Stress echocardiogram  EKG:  EKG is ordered today.  It shows normal sinus rhythm with left atrial abnormality and poor R wave progression, nonspecific ST segment changes in leads II, III and aVF.  Recent Labs: 12/11/2017: ALT 26; BUN 12; Creat 0.78; Hemoglobin 13.0; Platelets 295; Potassium 5.1; Sodium 138; TSH 1.57  Recent Lipid Panel    Component Value Date/Time   CHOL 209 (H) 05/21/2017 1016   TRIG 114 05/21/2017 1016   HDL 71 05/21/2017 1016   CHOLHDL 2.9 05/21/2017 1016   VLDL 17 11/16/2015 0827   LDLCALC 116 (H) 05/21/2017 1016    Physical Exam:    VS:  BP (!) 144/90   Pulse 75   Ht 5\' 2"  (1.575 m)   Wt 165 lb 3.2 oz (74.9 kg)   SpO2 99%   BMI 30.22 kg/m     Wt Readings from Last 3 Encounters:  05/14/18 165 lb 3.2 oz (74.9 kg)  02/16/18 181 lb (82.1 kg)  01/21/18 177 lb (80.3 kg)      General: Alert, oriented x3, no distress, borderline obese Head: no evidence of trauma, PERRL, EOMI, no exophtalmos or lid lag, no myxedema, no xanthelasma; normal ears, nose and oropharynx Neck: normal jugular venous pulsations and no  hepatojugular reflux; brisk carotid pulses without delay and no carotid bruits Chest: clear to auscultation, no signs of consolidation by percussion or palpation, normal fremitus, symmetrical and full respiratory excursions Cardiovascular: normal position and quality of the apical impulse, regular rhythm, normal first and second heart sounds, no murmurs, rubs or gallops Abdomen: no tenderness or distention, no masses by palpation, no abnormal pulsatility or arterial bruits, normal bowel sounds, no hepatosplenomegaly Extremities: no clubbing, cyanosis or edema; 2+ radial, ulnar and brachial pulses bilaterally; 2+  right femoral, posterior tibial and dorsalis pedis pulses; 2+ left femoral, posterior tibial and dorsalis pedis pulses; no subclavian or femoral bruits Neurological: grossly nonfocal Psych: Normal mood and affect   ASSESSMENT:    1. Chest pressure   2. Elevated blood pressure reading   3. Hypercholesterolemia   4. Dizziness   5. Palpitations    PLAN:    In order of problems listed above:  1. Chest pressure: Resolved.  Normal stress echo 2. Elevated BP: The bulk of evidence suggest that she has true hypertension and not just situational hypertension.  Her blood pressure is slightly elevated, but she is doing a great job with exercise and weight loss.  Discussed sodium restricted/DASH diet.  I would wait another 3 months before we decide to escalate therapy with antihypertensives.  She never started the thiazide diuretic that is listed on her medication list. 3. HLP: Her LDL cholesterol is mildly elevated, but she has excellent HDL.  Suspect these numbers are improving already with weight loss.  No evidence of coronary or vascular disease has been identified and pharmacological therapy is not required at this time. 4. Dizziness: Improved with better hydration 5. Palpitations: No true arrhythmia identified with her Apple Watch.  These have not bothered her recently.   Medication  Adjustments/Labs and Tests Ordered: Current medicines are reviewed at length with the patient today.  Concerns regarding medicines are outlined above.  Orders Placed This Encounter  Procedures  . EKG 12-Lead   No orders of the defined types were placed in this encounter.   Patient Instructions  Medication Instructions:  Continue same medications If you need a refill on your cardiac medications before your next appointment, please call your pharmacy.   Lab work: None ordered   Testing/Procedures: None ordered Follow-Up: At [facility], you and your health needs are our priority.  As part of our continuing mission to provide you with exceptional heart care, we have created designated Provider Care Teams.  These Care Teams include your primary Cardiologist (physician) and Advanced Practice Providers (APPs -  Physician Assistants and Nurse Practitioners) who all work together to provide you with the care you need, when you need it. . Follow up appointment with Dr. in 6 months  Call 3 months before to schedule      Signed, Sanda Klein, MD  05/15/2018 12:05 PM    [place]

## 2018-05-15 ENCOUNTER — Encounter: Payer: Self-pay | Admitting: Cardiovascular Disease

## 2018-06-07 ENCOUNTER — Telehealth: Payer: Self-pay | Admitting: Neurology

## 2018-06-07 ENCOUNTER — Encounter: Payer: Self-pay | Admitting: Neurology

## 2018-06-07 NOTE — Telephone Encounter (Signed)
Due to current COVID 19 pandemic, our office is severely reducing in office visits for at least the next 2 weeks, in order to minimize the risk to our patients and healthcare providers. Pt understands that although there may be some limitations with this type of visit, we will take all precautions to reduce any security or privacy concerns.  Pt understands that this will be treated like an in office visit and we will file with pt's insurance, and there may be a patient responsible charge related to this service. Pt's email is [email or web address]. Pt understands that the [employer] webex software must be downloaded and operational on the device pt plans to use for the visit.

## 2018-06-08 NOTE — Addendum Note (Signed)
Addended by: Lester Marion A on: 06/08/2018 09:34 AM   Modules accepted: Orders

## 2018-06-08 NOTE — Telephone Encounter (Signed)
I called pt to update her chart prior to the pt's appt tomorrow with Dr. Rexene Alberts. Meds, allergies, and PMH updated. I spent 4 minutes on the phone with the pt. She has downloaded the [employer].

## 2018-06-09 ENCOUNTER — Other Ambulatory Visit: Payer: Self-pay

## 2018-06-09 ENCOUNTER — Encounter: Payer: Self-pay | Admitting: Neurology

## 2018-06-09 ENCOUNTER — Ambulatory Visit (INDEPENDENT_AMBULATORY_CARE_PROVIDER_SITE_OTHER): Payer: BC Managed Care – PPO | Admitting: Neurology

## 2018-06-09 DIAGNOSIS — R634 Abnormal weight loss: Secondary | ICD-10-CM | POA: Diagnosis not present

## 2018-06-09 DIAGNOSIS — Z9989 Dependence on other enabling machines and devices: Secondary | ICD-10-CM | POA: Diagnosis not present

## 2018-06-09 DIAGNOSIS — G4733 Obstructive sleep apnea (adult) (pediatric): Secondary | ICD-10-CM

## 2018-06-09 NOTE — Progress Notes (Signed)
Interim history:   Kristin Whitney is a 58 year old right-handed woman with an underlying medical history of anemia, depression, history of gastritis, anxiety, ADHD and obesity, with whom I am conducting a virtual, video based follow-up appointment via Webex, in lieu of a face-to-face visit. The patient is unaccompanied today and joins via her phone, but I had to also call her for her to have audio. I first met her on 01/21/2018 at the request of her primary care physician, at which time she reported snoring and excessive daytime somnolence, Epworth sleepiness score was 8 out of 24 at the time. She was advised to proceed with sleep testing. She had a baseline sleep study followed by a CPAP titration study. I went over her test results with her in detail today. Baseline sleep study from 03/01/2018 showed a sleep latency of 2.5 minutes, sleep efficiency of 92%, REM latency was delayed at 165 minutes. She had a reduced percentage of REM sleep, she had an increased percentage of stage II sleep. Total AHI was elevated in the moderate range at 21.8 per hour, REM AHI was in the severe range at 39.1 per hour, supine AHI was also in the severe range at 45.3 per hour. Average oxygen saturation was 92%, nadir was 82%. She had no significant PLMS and no significant EKG changes. She was advised to return for a CPAP titration study. She had this on 03/18/2018. Sleep efficiency was 87.8%, sleep latency 13.5 minutes and REM latency mildly delayed at 159 minutes. She was fitted with a nasal mask and a full facemask and preferred the full facemask, small Simplus. CPAP was titrated from 5 cm to 18 cm. On a pressure of 17 cm her AHI was 8.6 per hour with supine REM sleep achieved an O2 nadir of 94%. She had no significant PLMS. Based on her sleep study results I prescribed a home CPAP therapy at a pressure of 17 cm. She called in the interim in February 2020 reporting that the pressure felt too high, compliance data looked  good, I suggested we reduce the pressure from 17 cm to 15 cm at the time. Today, 06/09/2018: Please also see below for documentation of the virtual visit.  I reviewed her CPAP compliance data from 05/09/2018 through 06/07/2018 which is a total of 30 days, during which time she used her machine every night with percent used days greater than 4 hours and 22 days, 73%, indicating adequate compliance with an average usage of 5 hours and 31 minutes, residual AHI at goal at 2.1 per hour, leak on the low side with the 95th percentile at 2.4 L/m on a pressure of 15 cm with EPR of 3.  The patient's allergies, current medications, family history, past medical history, past social history, past surgical history and problem list were reviewed and updated as appropriate.    Previously (copied from previous notes for reference):   01/21/2018: (She) reports snoring and excessive daytime somnolence. I reviewed your office note from 01/11/2018. Her Epworth sleepiness score is 8 out of 24 today, fatigue score is 44/63. She is married and lives with her husband, they have 2 children. She works for [employer] system. She quit smoking in the 80s and smoked for another year in the 77s. She drinks alcohol occasionally, coffee 2 cups per day on average and otherwise no significant caffeine intake. She has had difficulty maintaining sleep. She has multiple nighttime awakenings. She has tried Valium for anxiety at night which has been helpful.  Nevertheless she still wakes up at night. She has had morning headaches. She has nocturia about once per average night, not aware of any family history of OSA. She has had restless leg symptoms off-and-on, much more pronounced when she was severely anemic in the past. She has even seen hematology for her severe anemia. She had PLMs in the past per husbands report. 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 Bedtime is around 9 and rise time around 5. She does not  watch TV in her bedroom, she has a dog in the bedroom but not in bed with her. She has had trouble losing weight. She had recent blood work on 01/11/18, which showed normal B12, neg Lyme, ESR, Vit D lower normal at 41, TSH. Not aware of a FHx of OSA, has had RLS, especially when she was severely anemic. PLMs were reported per husband in the past too.  Her Past Medical History Is Significant For: Past Medical History:  Diagnosis Date   ADHD (attention deficit hyperactivity disorder)    Anemia    Depression    Fibrocystic breast changes    left   Gastritis    egd- 2018 Dr. Collene Mares    Her Past Surgical History Is Significant For: Past Surgical History:  Procedure Laterality Date   BREAST BIOPSY Left 2012   GYNECOLOGIC CRYOSURGERY     NASAL SEPTUM SURGERY  1994   PELVIC LAPAROSCOPY  12/09/2008   PELVIC WASHINGS, EXCISION OF TORSED LEFT PARATUBULAR CYST. ROLLERBALL ENDOMETRIAL ABLATION    Her Family History Is Significant For: Family History  Problem Relation Age of Onset   Hyperlipidemia Mother    Alzheimer's disease Mother    Breast cancer Maternal Aunt    Breast cancer Maternal Aunt    Stroke Father    Alcohol abuse Father    Breast cancer Cousin     Her Social History Is Significant For: Social History   Socioeconomic History   Marital status: Married    Spouse name: Not on file   Number of children: Not on file   Years of education: Not on file   Highest education level: Not on file  Occupational History   Not on file  Social Needs   Financial resource strain: Not on file   Food insecurity:    Worry: Not on file    Inability: Not on file   Transportation needs:    Medical: Not on file    Non-medical: Not on file  Tobacco Use   Smoking status: Never Smoker   Smokeless tobacco: Never Used  Substance and Sexual Activity   Alcohol use: Yes    Alcohol/week: 0.0 standard drinks    Comment: WINE - rare   Drug use: No   Sexual activity:  Yes    Birth control/protection: None, Other-see comments    Comment: PATIENT'S HUSBAND WITH VASECTOMY  Lifestyle   Physical activity:    Days per week: Not on file    Minutes per session: Not on file   Stress: Not on file  Relationships   Social connections:    Talks on phone: Not on file    Gets together: Not on file    Attends religious service: Not on file    Active member of club or organization: Not on file    Attends meetings of clubs or organizations: Not on file    Relationship status: Not on file  Other Topics Concern   Not on file  Social History  Narrative   Not on file    Her Allergies Are:  Allergies  Allergen Reactions   Clarithromycin    Codeine Nausea And Vomiting  :   Her Current Medications Are:  Outpatient Encounter Medications as of 06/09/2018  Medication Sig   albuterol (PROVENTIL HFA;VENTOLIN HFA) 108 (90 Base) MCG/ACT inhaler TAKE 2 PUFFS INTO LUNGS EVERY 6 HOURS AS NEEDED FOR WHEEZE OR SHORTNESS OF BREATH   ALPRAZolam (XANAX) 0.5 MG tablet TAKE 1 TABLET (0.5 MG TOTAL) BY MOUTH AT BEDTIME AS NEEDED. REPORTED ON 08/02/2015   diazepam (VALIUM) 10 MG tablet Take 1 tablet (10 mg total) by mouth at bedtime as needed for anxiety or sleep.   hydrochlorothiazide (MICROZIDE) 12.5 MG capsule Take 1 capsule (12.5 mg total) by mouth daily. (Patient not taking: Reported on 06/08/2018)   loratadine (CLARITIN) 10 MG tablet Take 10 mg by mouth daily.   MILK THISTLE PLUS PO Take by mouth as directed.   Multiple Vitamin (MULTIVITAMIN) tablet Take 1 tablet by mouth daily.   No facility-administered encounter medications on file as of 06/09/2018.   :  Review of Systems:  Out of a complete 14 point review of systems, all are reviewed and negative with the exception of these symptoms as listed below: no new issues.  Virtual Visit via Video Note on 06/09/2018:  I connected with Kristin Whitney on 06/09/18 at  8:30 AM EDT by a video enabled telemedicine application  and verified that I am speaking with the correct person using two identifiers.   I discussed the limitations of evaluation and management by telemedicine and the availability of in person appointments. The patient expressed understanding and agreed to proceed.  History of Present Illness:  She reports doing rather well. Her daytime energy is better, her daytime sleepiness much less, she feels very good in that regard. She still has some episodes of bloating and air swallowing but this improved quite a bit after we were able to reduce the pressure to 15. On another positive note she has been able to lose a significant amount of weight over time. In December when we first did her sleep study she weighed around 177, About a month ago she weighed 165 and by her own report she currently weighs 157 as of yesterday. She is very pleased with her outcome thus far. She is using a mouth and nose mask because he is a mouth breather. Overall, this is well worth it to her and she feels significantly improved.  Observations/Objective:  Her last recorded vitals that I could reviewing the chart were from 05/14/2018. Blood pressure 144/90, pulse 75, weight 165.2 for a BMI of 30.2. She weighs 157 as of yesterday's weight at home. She looks well. She is in good spirits, she is in no acute distress. Speech is clear, no dysarthria, face is symmetric. She is able to move both upper extremities without problems.  Assessment and Plan:  In summary, Kristin Whitney is a very pleasant 58 year old female with an underlying medical history of anemia, depression, history of gastritis, allergies, anxiety, ADHD and obesity, who presents for a virtual video based follow-up visit for her recent diagnosis of obstructive sleep apnea after sleep study testing including a baseline sleep study in December 2019 and a CPAP titration study in January 2020. She was diagnosed with moderate to severe obstructive sleep apnea and has started CPAP  therapy. We reduce the pressure originally from 17 cm to 15 cm about a month or so ago. She  is compliant with treatment and indicates very good results after she was able to reduce the pressure from 17 cm to 15 cm. She is highly commended for treatment adherence and congratulated on her weight loss thus far. I suggested we reduce her pressure further to 13 cm today and review her compliance in about a month. She is encouraged to call for that and we can pull her data remotely.  I again explained in particular the risks and ramifications of untreated moderate to severe OSA, especially with respect to developing cardiovascular disease down the Road, and even cerebrovascular disease is correlated with sleep apnea. She has done very well. She is motivated to continue with treatment. She is advised that she may be able to tolerate a lower pressure even better. She is agreeable to this. To that end, I will send a pressure change order to her current DME company and she also needs a new mask which I encouraged her to have them sent to her rather than her picking up at this time. She is advised to follow-up routinely with hopefully a face-to-face visit in 6 months, and then yearly thereafter possible. I answered all her questions today and she was in agreement with the plan.  Follow Up Instructions:  1. Reduce pressure to 13 cm - DME order. 2. Review compliance download for 30 days on the new pressure of 13 cm in about a month, patient to call. 3. FU in 6 mo.   I discussed the assessment and treatment plan with the patient. The patient was provided an opportunity to ask questions and all were answered. The patient agreed with the plan and demonstrated an understanding of the instructions.   The patient was advised to call back or seek an in-person evaluation if the symptoms worsen or if the condition fails to improve as anticipated.  I provided 30 minutes of non-face-to-face time during this  encounter.   [name], MD

## 2018-06-09 NOTE — Patient Instructions (Signed)
Given verbally, during today's virtual video-based encounter, with verbal feedback received.   

## 2018-07-01 ENCOUNTER — Other Ambulatory Visit: Payer: Self-pay | Admitting: Family Medicine

## 2018-07-01 MED ORDER — DIAZEPAM 10 MG PO TABS: 10.0000 mg | ORAL_TABLET | Freq: Every evening | ORAL | 1 refills | Status: DC | PRN

## 2018-07-01 NOTE — Telephone Encounter (Signed)
Requesting refill    Valium  LOV: 02/16/18   LRF: 01/11/18

## 2018-07-02 ENCOUNTER — Other Ambulatory Visit: Payer: Self-pay | Admitting: *Deleted

## 2018-09-28 ENCOUNTER — Encounter: Payer: Self-pay | Admitting: Orthopaedic Surgery

## 2018-09-28 ENCOUNTER — Ambulatory Visit (INDEPENDENT_AMBULATORY_CARE_PROVIDER_SITE_OTHER): Payer: BC Managed Care – PPO | Admitting: Orthopaedic Surgery

## 2018-09-28 DIAGNOSIS — S62609A Fracture of unspecified phalanx of unspecified finger, initial encounter for closed fracture: Secondary | ICD-10-CM | POA: Insufficient documentation

## 2018-09-28 DIAGNOSIS — S62616A Displaced fracture of proximal phalanx of right little finger, initial encounter for closed fracture: Secondary | ICD-10-CM
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
## 2018-10-04 ENCOUNTER — Other Ambulatory Visit: Payer: Self-pay | Admitting: Orthopaedic Surgery

## 2018-10-04 DIAGNOSIS — S62616A Displaced fracture of proximal phalanx of right little finger, initial encounter for closed fracture: Secondary | ICD-10-CM

## 2018-10-04 MED ORDER — HYDROCODONE-ACETAMINOPHEN 5-325 MG PO TABS: 1.0000 | ORAL_TABLET | Freq: Four times a day (QID) | ORAL | 0 refills | Status: DC | PRN

## 2018-10-05 ENCOUNTER — Telehealth: Payer: Self-pay | Admitting: Orthopaedic Surgery

## 2018-10-05 NOTE — Telephone Encounter (Signed)
I called patient . She is going to be out of town 8/7 through the following week, but was told Dr. Lorin Mercy needed to see her 2 weeks post op. Appt made for Wed 8/5 for first post op visit.

## 2018-10-05 NOTE — Telephone Encounter (Signed)
Kristin Whitney this patient stated had surgery yesterday and some questions. Didn't want triage.  Has questions about discharge inf.

## 2018-10-11 ENCOUNTER — Other Ambulatory Visit: Payer: Self-pay | Admitting: Family Medicine

## 2018-10-11 NOTE — Telephone Encounter (Signed)
Ok to refill??  Last office visit 02/16/2018.  Last refill 04/05/2018, #2 refills.

## 2018-10-12 ENCOUNTER — Inpatient Hospital Stay: Payer: BC Managed Care – PPO | Admitting: Orthopaedic Surgery

## 2018-10-13 ENCOUNTER — Encounter: Payer: Self-pay | Admitting: Orthopaedic Surgery

## 2018-10-13 ENCOUNTER — Ambulatory Visit (INDEPENDENT_AMBULATORY_CARE_PROVIDER_SITE_OTHER): Payer: BC Managed Care – PPO

## 2018-10-13 ENCOUNTER — Ambulatory Visit (INDEPENDENT_AMBULATORY_CARE_PROVIDER_SITE_OTHER): Payer: BC Managed Care – PPO | Admitting: Orthopaedic Surgery

## 2018-10-13 VITALS — Ht 62.0 in | Wt 149.0 lb

## 2018-10-13 DIAGNOSIS — S62616A Displaced fracture of proximal phalanx of right little finger, initial encounter for closed fracture: Secondary | ICD-10-CM

## 2018-10-13 NOTE — Progress Notes (Signed)
   Post-Op Visit Note   Patient: Kristin Whitney           Date of Birth: [birth year]           MRN: [id number] Visit Date: 10/13/2018 PCP: Susy Frizzle, MD   Assessment & Plan: Patient turns post right small finger cross pinning proximal phalanx fracture.  Pin tracks look good she can remove the splint though will be made at the hand therapist for showers and then can reapply it.  Rotation looks good sensation of the fingertip is intact.  Chief Complaint:  Chief Complaint  Patient presents with  . Right Little Finger - Routine Post Op    10/04/2018 closed reduction, pinning right little finger   Visit Diagnoses:  1. Closed displaced fracture of proximal phalanx of right little finger, initial encounter     Plan: Patient referred for static splint MCP flex PIP extended ring and small.  Return 2 weeks for recheck.  Repeat x-rays on return.  Follow-Up Instructions: No follow-ups on file.   Orders:  Orders Placed This Encounter  Procedures  . XR Finger Little Right   No orders of the defined types were placed in this encounter.   Imaging: Xr Finger Little Right  Result Date: 10/13/2018 Three-view x-rays left small finger demonstrates cross pinning of proximal phalanx fracture.  Pins remain across the fracture site.  There is improved alignment from preoperative images. Impression: Post cross pinning right small finger proximal phalanx transverse fracture.   PMFS History: Patient Active Problem List   Diagnosis Date Noted  . Finger fracture, right 09/28/2018  . Gastritis   . Neck pain 08/14/2016  . Tonsillar calculus 08/14/2016  . 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 Irregular menstrual cycle 04/28/2016  . History of vitamin D deficiency 12/05/2014  . Perimenopause 12/05/2014  . History of anemia 06/07/2012  . Tiredness 06/07/2012  . Menstrual migraine 06/02/2011   Past Medical History:  Diagnosis Date  . ADHD (attention deficit hyperactivity  disorder)   . Anemia   . Depression   . Fibrocystic breast changes    left  . Gastritis    egd- 2018 Dr. Collene Mares    Family History  Problem Relation Age of Onset  . Hyperlipidemia Mother   . Alzheimer's disease Mother   . Breast cancer Maternal Aunt   . Breast cancer Maternal Aunt   . Stroke Father   . Alcohol abuse Father   . Breast cancer Cousin     Past Surgical History:  Procedure Laterality Date  . BREAST BIOPSY Left 2012  . GYNECOLOGIC CRYOSURGERY    . NASAL SEPTUM SURGERY  1994  . PELVIC LAPAROSCOPY  12/09/2008   PELVIC WASHINGS, EXCISION OF TORSED LEFT PARATUBULAR CYST. ROLLERBALL ENDOMETRIAL ABLATION   Social History   Occupational History  . Not on file  Tobacco Use  . Smoking status: Never Smoker  . Smokeless tobacco: Never Used  Substance and Sexual Activity  . Alcohol use: Yes    Alcohol/week: 0.0 standard drinks    Comment: WINE - rare  . Drug use: No  . Sexual activity: Yes    Birth control/protection: None, Other-see comments    Comment: PATIENT'S HUSBAND WITH VASECTOMY

## 2018-10-14 ENCOUNTER — Telehealth: Payer: Self-pay | Admitting: Orthopaedic Surgery

## 2018-10-14 NOTE — Telephone Encounter (Signed)
I called patient. She thinks custom splint is pushing on a pin.  She has already called to have PT and Hand look at the splint and possibly refit it. She has appt there today at 1100 and will call back here if they feel there are other problems.

## 2018-10-14 NOTE — Telephone Encounter (Signed)
Patient called stating that she went yesterday to be fitted for the brace and now she feels like the pins in her hand is trying to come through the skin and it is painful.  CB#[phone].  Thank you.

## 2018-10-26 ENCOUNTER — Ambulatory Visit (INDEPENDENT_AMBULATORY_CARE_PROVIDER_SITE_OTHER): Payer: BC Managed Care – PPO

## 2018-10-26 ENCOUNTER — Ambulatory Visit (INDEPENDENT_AMBULATORY_CARE_PROVIDER_SITE_OTHER): Payer: BC Managed Care – PPO | Admitting: Orthopaedic Surgery

## 2018-10-26 ENCOUNTER — Other Ambulatory Visit: Payer: Self-pay

## 2018-10-26 ENCOUNTER — Encounter: Payer: Self-pay | Admitting: Orthopaedic Surgery

## 2018-10-26 VITALS — BP 136/83 | HR 70 | Ht 62.0 in | Wt 149.0 lb

## 2018-10-26 DIAGNOSIS — S62616A Displaced fracture of proximal phalanx of right little finger, initial encounter for closed fracture: Secondary | ICD-10-CM

## 2018-10-26 NOTE — Progress Notes (Signed)
Post cross pinning right fifth finger proximal phalanx fracture surgery date 10/04/2018.  She had some pin migration with head protruded through the skin and pins are loose x-rays demonstrate interval healing and pins were removed today continue splint we discussed buddy taping to the ring finger to work on finger flexion and passive flexion of the PIP joint that she can do on her own using her opposite hand which we discussed and reviewed.  She is already had some instruction on this with the therapist.  Recheck 2 weeks.  No x-ray needed on return.

## 2018-10-27 ENCOUNTER — Ambulatory Visit: Payer: BC Managed Care – PPO | Admitting: Orthopaedic Surgery

## 2018-11-09 ENCOUNTER — Encounter: Payer: Self-pay | Admitting: Orthopaedic Surgery

## 2018-11-09 ENCOUNTER — Ambulatory Visit (INDEPENDENT_AMBULATORY_CARE_PROVIDER_SITE_OTHER): Payer: BC Managed Care – PPO | Admitting: Orthopaedic Surgery

## 2018-11-09 VITALS — Ht 62.0 in | Wt 149.0 lb

## 2018-11-09 DIAGNOSIS — S62616D Displaced fracture of proximal phalanx of right little finger, subsequent encounter for fracture with routine healing: Secondary | ICD-10-CM

## 2018-11-09 NOTE — Addendum Note (Signed)
Addended by: Minda Ditto, Geoffery Spruce on: 11/09/2018 05:14 PM   Modules accepted: Orders

## 2018-11-09 NOTE — Progress Notes (Signed)
   Post-Op Visit Note   Patient: Kristin Whitney           Date of Birth: 1960/07/13           MRN: [id number] Visit Date: 11/09/2018 PCP: Susy Frizzle, MD   Assessment & Plan: Lucencies follow-up right fifth finger proximal phalanx fracture.  She returns she is has several centimeters touching finger tip to distal palmar crease.  We spent some time with passive flexion and improved and about 1 cm passively.  She will need some formal therapy to work on finger range of motion.  I plan to recheck her in 3 weeks.  Chief Complaint:  Chief Complaint  Patient presents with  . Right Little Finger - Follow-up    10/04/2018 Pinning right 5th finger proximal phalanx fx   Visit Diagnoses:  1. Closed displaced fracture of proximal phalanx of right little finger with routine healing, subsequent encounter     Plan: We went over again proper technique for wrist extension working on finger flexion passively trying to get to distal palmar crease.  We will set up for formal therapy and she can return in 3 weeks.  Follow-Up Instructions: Return in about 3 weeks (around 11/30/2018).   Orders:  No orders of the defined types were placed in this encounter.  No orders of the defined types were placed in this encounter.   Imaging: No results found.  PMFS History: Patient Active Problem List   Diagnosis Date Noted  . Finger fracture, right 09/28/2018  . Gastritis   . Neck pain 08/14/2016  . Tonsillar calculus 08/14/2016  . 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 Irregular menstrual cycle 04/28/2016  . History of vitamin D deficiency 12/05/2014  . Perimenopause 12/05/2014  . History of anemia 06/07/2012  . Tiredness 06/07/2012  . Menstrual migraine 06/02/2011   Past Medical History:  Diagnosis Date  . ADHD (attention deficit hyperactivity disorder)   . Anemia   . Depression   . Fibrocystic breast changes    left  . Gastritis    egd- 2018 Dr. Collene Mares    Family History   Problem Relation Age of Onset  . Hyperlipidemia Mother   . Alzheimer's disease Mother   . Breast cancer Maternal Aunt   . Breast cancer Maternal Aunt   . Stroke Father   . Alcohol abuse Father   . Breast cancer Cousin     Past Surgical History:  Procedure Laterality Date  . BREAST BIOPSY Left 2012  . GYNECOLOGIC CRYOSURGERY    . NASAL SEPTUM SURGERY  1994  . PELVIC LAPAROSCOPY  12/09/2008   PELVIC WASHINGS, EXCISION OF TORSED LEFT PARATUBULAR CYST. ROLLERBALL ENDOMETRIAL ABLATION   Social History   Occupational History  . Not on file  Tobacco Use  . Smoking status: Never Smoker  . Smokeless tobacco: Never Used  Substance and Sexual Activity  . Alcohol use: Yes    Alcohol/week: 0.0 standard drinks    Comment: WINE - rare  . Drug use: No  . Sexual activity: Yes    Birth control/protection: None, Other-see comments    Comment: PATIENT'S HUSBAND WITH VASECTOMY

## 2018-11-30 ENCOUNTER — Other Ambulatory Visit: Payer: Self-pay

## 2018-11-30 ENCOUNTER — Ambulatory Visit (INDEPENDENT_AMBULATORY_CARE_PROVIDER_SITE_OTHER): Payer: BC Managed Care – PPO | Admitting: Orthopaedic Surgery

## 2018-11-30 ENCOUNTER — Encounter: Payer: Self-pay | Admitting: Orthopaedic Surgery

## 2018-11-30 VITALS — BP 144/64 | HR 72 | Ht 62.0 in | Wt 149.0 lb

## 2018-11-30 DIAGNOSIS — S62616D Displaced fracture of proximal phalanx of right little finger, subsequent encounter for fracture with routine healing: Secondary | ICD-10-CM

## 2018-11-30 NOTE — Progress Notes (Signed)
   Post-Op Visit Note   Patient: Kristin Whitney           Date of Birth: 1960-12-28           MRN: [id number] Visit Date: 11/30/2018 PCP: Susy Frizzle, MD   Assessment & Plan: Follow-up fifth finger proximal phalanx fracture treated with pinning 09/29/2018 now 2 months out.  She lacks 5 mm touching fingertip to distal palmar crease.  At times is 10 mm.  Becomes nearly at full extension with the PIP joint.  She still doing her exercises has made significant improvement from last office visit 3 weeks ago.  Continue exercising she is doing pretty much all normal daily activities return for likely final visit in 7 weeks.  Chief Complaint:  Chief Complaint  Patient presents with  . Right Little Finger - Follow-up    10/04/2018 Pinning right 5th finger proximal phalanx fx  . Right Arm - Pain   Visit Diagnoses:  1. Closed displaced fracture of proximal phalanx of right little finger with routine healing, subsequent encounter     Plan: Continue finger range of motion return in 7 weeks.  Follow-Up Instructions: Return in about 7 weeks (around 01/18/2019).   Orders:  No orders of the defined types were placed in this encounter.  No orders of the defined types were placed in this encounter.   Imaging: No results found.  PMFS History: Patient Active Problem List   Diagnosis Date Noted  . Finger fracture, right 09/28/2018  . Gastritis   . Neck pain 08/14/2016  . Tonsillar calculus 08/14/2016  . 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 Irregular menstrual cycle 04/28/2016  . History of vitamin D deficiency 12/05/2014  . Perimenopause 12/05/2014  . History of anemia 06/07/2012  . Tiredness 06/07/2012  . Menstrual migraine 06/02/2011   Past Medical History:  Diagnosis Date  . ADHD (attention deficit hyperactivity disorder)   . Anemia   . Depression   . Fibrocystic breast changes    left  . Gastritis    egd- 2018 Dr. Collene Mares    Family History  Problem Relation  Age of Onset  . Hyperlipidemia Mother   . Alzheimer's disease Mother   . Breast cancer Maternal Aunt   . Breast cancer Maternal Aunt   . Stroke Father   . Alcohol abuse Father   . Breast cancer Cousin     Past Surgical History:  Procedure Laterality Date  . BREAST BIOPSY Left 2012  . GYNECOLOGIC CRYOSURGERY    . NASAL SEPTUM SURGERY  1994  . PELVIC LAPAROSCOPY  12/09/2008   PELVIC WASHINGS, EXCISION OF TORSED LEFT PARATUBULAR CYST. ROLLERBALL ENDOMETRIAL ABLATION   Social History   Occupational History  . Not on file  Tobacco Use  . Smoking status: Never Smoker  . Smokeless tobacco: Never Used  Substance and Sexual Activity  . Alcohol use: Yes    Alcohol/week: 0.0 standard drinks    Comment: WINE - rare  . Drug use: No  . Sexual activity: Yes    Birth control/protection: None, Other-see comments    Comment: PATIENT'S HUSBAND WITH VASECTOMY

## 2018-12-15 ENCOUNTER — Other Ambulatory Visit: Payer: Self-pay

## 2018-12-15 ENCOUNTER — Encounter: Payer: Self-pay | Admitting: Neurology

## 2018-12-15 ENCOUNTER — Ambulatory Visit: Payer: BC Managed Care – PPO | Admitting: Neurology

## 2018-12-15 VITALS — BP 130/84 | HR 71 | Ht 62.0 in | Wt 145.0 lb

## 2018-12-15 DIAGNOSIS — G4733 Obstructive sleep apnea (adult) (pediatric): Secondary | ICD-10-CM | POA: Diagnosis not present

## 2018-12-15 DIAGNOSIS — Z9989 Dependence on other enabling machines and devices: Secondary | ICD-10-CM | POA: Diagnosis not present

## 2018-12-15 DIAGNOSIS — Z789 Other specified health status: Secondary | ICD-10-CM | POA: Diagnosis not present

## 2018-12-15 DIAGNOSIS — R634 Abnormal weight loss: Secondary | ICD-10-CM

## 2018-12-15 NOTE — Progress Notes (Signed)
Subjective:    Patient ID: Kristin Whitney is a 58 y.o. female.  HPI     Interim history:   Ms. Kristin Whitney is a 58 year old right-handed woman with an underlying medical history of anemia, depression, history of gastritis, anxiety, ADHD and obesity, who presents for follow-up consultation of her obstructive sleep apnea, on CPAP therapy.  The patient is unaccompanied today.  I last saw her on 06/09/2018 in virtual visit, at which time we talked about her sleep study results.  She was compliant with CPAP and felt improved.  She was working on weight loss and had lost quite a bit of weight already.  We had reduced her pressure in the interim to 15 cm and at the April appointment I suggested we reduce it further to 13 cm.  Today, 12/15/2018: I reviewed her CPAP compliance data from the past 30 days, during which time she used her machine only 5 days.  In the past 90 days she used her CPAP only 31 days with percent use days greater than 4 hours at 6%, indicating significantly low compliance with an average usage of 2 hours and 35 minutes, residual AHI 2.2, leak 18.1 L/min for the 95th percentile, pressure at 13 cm with EPR of 3.    She reports that she has been able to lose more weight.  She has had trouble tolerating the pressure.  She overall feels better when she is able to use the CPAP but has a hard time tolerating the pressure at this time.  She has struggled with the different types of masks and currently is using a ResMed F 30i hybrid fullface mask.  She had felt much improved when she first started CPAP therapy and it helped to reduce the pressure in the interim.  Nevertheless, she is currently struggling more than not.  She would like to be able to use her machine better.   The patient's allergies, current medications, family history, past medical history, past social history, past surgical history and problem list were reviewed and updated as appropriate.    Previously (copied from  previous notes for reference):     I first met her on 01/21/2018 at the request of her primary care physician, at which time she reported snoring and excessive daytime somnolence, Epworth sleepiness score was 8 out of 24 at the time. She was advised to proceed with sleep testing. She had a baseline sleep study followed by a CPAP titration study. I went over her test results with her in detail today. Baseline sleep study from 03/01/2018 showed a sleep latency of 2.5 minutes, sleep efficiency of 92%, REM latency was delayed at 165 minutes. She had a reduced percentage of REM sleep, she had an increased percentage of stage II sleep. Total AHI was elevated in the moderate range at 21.8 per hour, REM AHI was in the severe range at 39.1 per hour, supine AHI was also in the severe range at 45.3 per hour. Average oxygen saturation was 92%, nadir was 82%. She had no significant PLMS and no significant EKG changes. She was advised to return for a CPAP titration study. She had this on 03/18/2018. Sleep efficiency was 87.8%, sleep latency 13.5 minutes and REM latency mildly delayed at 159 minutes. She was fitted with a nasal mask and a full facemask and preferred the full facemask, small Simplus. CPAP was titrated from 5 cm to 18 cm. On a pressure of 17 cm her AHI was 8.6 per hour with supine REM  sleep achieved an O2 nadir of 94%. She had no significant PLMS. Based on her sleep study results I prescribed a home CPAP therapy at a pressure of 17 cm. She called in the interim in February 2020 reporting that the pressure felt too high, compliance data looked good, I suggested we reduce the pressure from 17 cm to 15 cm at the time.  I reviewed her CPAP compliance data from 05/09/2018 through 06/07/2018 which is a total of 30 days, during which time she used her machine every night with percent used days greater than 4 hours and 22 days, 73%, indicating adequate compliance with an average usage of 5 hours and 31 minutes,  residual AHI at goal at 2.1 per hour, leak on the low side with the 95th percentile at 2.4 L/m on a pressure of 15 cm with EPR of 3.  01/21/2018: (She) reports snoring and excessive daytime somnolence. I reviewed your office note from 01/11/2018. Her Epworth sleepiness score is 8 out of 24 today, fatigue score is 44/63. She is married and lives with her husband, they have 2 children. She works for [employer] system. She quit smoking in the 80s and smoked for another year in the 67s. She drinks alcohol occasionally, coffee 2 cups per day on average and otherwise no significant caffeine intake. She has had difficulty maintaining sleep. She has multiple nighttime awakenings. She has tried Valium for anxiety at night which has been helpful. Nevertheless she still wakes up at night. She has had morning headaches. She has nocturia about once per average night, not aware of any family history of OSA. She has had restless leg symptoms off-and-on, much more pronounced when she was severely anemic in the past. She has even seen hematology for her severe anemia. She had PLM's in the past per husband's report. 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 Bedtime is around 9 and rise time around 5. She does not watch TV in her bedroom, she has a dog in the bedroom but not in bed with her. She has had trouble losing weight. She had recent blood work on 01/11/18, which showed normal B12, neg Lyme, ESR, Vit D lower normal at 41, TSH. Not aware of a FHx of OSA, has had RLS, especially when she was severely anemic. PLMs were reported per husband in the past too.  Her Past Medical History Is Significant For: Past Medical History:  Diagnosis Date  . ADHD (attention deficit hyperactivity disorder)   . Anemia   . Depression   . Fibrocystic breast changes    left  . Gastritis    egd- 2018 Dr. Collene Mares    Her Past Surgical History Is Significant For: Past Surgical History:  Procedure Laterality Date   . BREAST BIOPSY Left 2012  . GYNECOLOGIC CRYOSURGERY    . NASAL SEPTUM SURGERY  1994  . PELVIC LAPAROSCOPY  12/09/2008   PELVIC WASHINGS, EXCISION OF TORSED LEFT PARATUBULAR CYST. ROLLERBALL ENDOMETRIAL ABLATION    Her Family History Is Significant For: Family History  Problem Relation Age of Onset  . Hyperlipidemia Mother   . Alzheimer's disease Mother   . Breast cancer Maternal Aunt   . Breast cancer Maternal Aunt   . Stroke Father   . Alcohol abuse Father   . Breast cancer Cousin     Her Social History Is Significant For: Social History   Socioeconomic History  . Marital status: Married    Spouse name: Not on file  .  Number of children: Not on file  . Years of education: Not on file  . Highest education level: Not on file  Occupational History  . Not on file  Social Needs  . Financial resource strain: Not on file  . Food insecurity    Worry: Not on file    Inability: Not on file  . Transportation needs    Medical: Not on file    Non-medical: Not on file  Tobacco Use  . Smoking status: Never Smoker  . Smokeless tobacco: Never Used  Substance and Sexual Activity  . Alcohol use: Yes    Alcohol/week: 0.0 standard drinks    Comment: WINE - rare  . Drug use: No  . Sexual activity: Yes    Birth control/protection: None, Other-see comments    Comment: PATIENT'S HUSBAND WITH VASECTOMY  Lifestyle  . Physical activity    Days per week: Not on file    Minutes per session: Not on file  . Stress: Not on file  Relationships  . Social Herbalist on phone: Not on file    Gets together: Not on file    Attends religious service: Not on file    Active member of club or organization: Not on file    Attends meetings of clubs or organizations: Not on file    Relationship status: Not on file  Other Topics Concern  . Not on file  Social History Narrative  . Not on file    Her Allergies Are:  Allergies  Allergen Reactions  . Clarithromycin   . Codeine  Nausea And Vomiting  :   Her Current Medications Are:  Outpatient Encounter Medications as of 12/15/2018  Medication Sig  . albuterol (PROVENTIL HFA;VENTOLIN HFA) 108 (90 Base) MCG/ACT inhaler TAKE 2 PUFFS INTO LUNGS EVERY 6 HOURS AS NEEDED FOR WHEEZE OR SHORTNESS OF BREATH  . ALPRAZolam (XANAX) 0.5 MG tablet TAKE 1 TABLET BY MOUTH AT BEDTIME AS NEEDED  . diazepam (VALIUM) 10 MG tablet Take 1 tablet (10 mg total) by mouth at bedtime as needed for anxiety or sleep.  Marland Kitchen HYDROcodone-acetaminophen (NORCO/VICODIN) 5-325 MG tablet Take 1 tablet by mouth every 6 (six) hours as needed for moderate pain.  Marland Kitchen loratadine (CLARITIN) 10 MG tablet Take 10 mg by mouth daily.  Marland Kitchen MILK THISTLE PLUS PO Take by mouth as directed.  . Multiple Vitamin (MULTIVITAMIN) tablet Take 1 tablet by mouth daily.  . [DISCONTINUED] hydrochlorothiazide (MICROZIDE) 12.5 MG capsule Take 1 capsule (12.5 mg total) by mouth daily. (Patient not taking: Reported on 06/08/2018)   No facility-administered encounter medications on file as of 12/15/2018.   :  Review of Systems:  Out of a complete 14 point review of systems, all are reviewed and negative with the exception of these symptoms as listed below: Review of Systems  Neurological:       Pt presents today to discuss her cpap. Pt feels the pressure is too high. She has lost weight. She is having hot flashes at night. She does feel better when she uses her cpap but has been unable to tolerate it recently.    Objective:  Neurological Exam  Physical Exam Physical Examination:   Vitals:   12/15/18 1349  BP: 130/84  Pulse: 71    General Examination: The patient is a very pleasant 58 y.o. female in no acute distress. She appears well-developed and well-nourished and well groomed.   HEENT: Normocephalic, atraumatic, pupils are equal, round and reactive to light, extraocular  tracking is good without limitation to gaze excursion or nystagmus noted. Normal smooth pursuit is noted.  Hearing is grossly intact. Face is symmetric with normal facial animation. Speech is clear with no dysarthria noted. There is no hypophonia. There is no lip, neck/head, jaw or voice tremor. Neck is supple with full range of passive and active motion. There are no carotid bruits on auscultation. Oropharynx exam reveals: mild mouth dryness, adequate dental hygiene and moderate airway crowding. Tongue protrudes centrally and palate elevates symmetrically.    Chest: Clear to auscultation without wheezing, rhonchi or crackles noted.  Heart: S1+S2+0, regular and normal without murmurs, rubs or gallops noted.   Abdomen: Soft, non-tender and non-distended with normal bowel sounds appreciated on auscultation.  Extremities: There is no edema in the distal lower extremities bilaterally.   Skin: Warm and dry without trophic changes noted.  Musculoskeletal: exam reveals no obvious joint deformities, tenderness or joint swelling or erythema.   Neurologically:  Mental status: The patient is awake, alert and oriented in all 4 spheres. Her immediate and remote memory, attention, language skills and fund of knowledge are appropriate. There is no evidence of aphasia, agnosia, apraxia or anomia. Speech is clear with normal prosody and enunciation. Thought process is linear. Mood is normal and affect is normal.  Cranial nerves II - XII are as described above under HEENT exam. Motor exam: Normal bulk, strength and tone is noted. There is no tremor or rebound. Fine motor skills and coordination: grossly intact.  Cerebellar testing: No dysmetria or intention tremor. There is no truncal or gait ataxia.  Sensory exam: intact to light touch in the upper and lower extremities.  Gait, station and balance: She stands easily. No veering to one side is noted. No leaning to one side is noted. Posture is age-appropriate and stance is narrow based. Gait shows normal stride length and normal pace. No problems turning are  noted.  Assessment and Plan:  In summary,Dontasia G Lunsfordis a very pleasant 27 year oldfemalewith an underlying medical history of anemia, depression, history of gastritis,allergies,anxiety, ADHD and obesity, whopresents for Follow-up consultation of her obstructive sleep apnea.  She had a baseline sleep study in December 2019 and a titration study in January 2020.  She had overall moderate to severe obstructive sleep apnea.  Her original pressure was rather high at 17 and we reduced it with better tolerance to 15 and in April because of weight loss achieved we were able to reduce it to 13.  She has had further weight loss success.  She remembers sleeping better with CPAP when she was consistently using it in the recent past.  She would like to be able to tolerate it better. I suggested we change her treatment modality to AutoPap and reduce her pressure range with minimum pressure Of 5 cm, maximum of 10 cm.  She is advised to call our office in 6 weeks so we can review another download, we can pull the data remotely.  She is advised to follow-up routinely in 6 months.  We will send the pressure modality change order to her DME company.  She is advised to call us with any other interim questions or concerns.  I answered all her questions today and she was in agreement. I spent 25 minutes in total face-to-face time with the patient, more than 50% of which was spent in counseling and coordination of care, reviewing test results, reviewing medication and discussing or reviewing the diagnosis of OSA, its prognosis and treatment options. Pertinent  laboratory and imaging test results that were available during this visit with the patient were reviewed by me and considered in my medical decision making (see chart for details).

## 2018-12-15 NOTE — Patient Instructions (Signed)
As discussed, we will reduce your overall pressure and switch you from CPAP to AutoPap of 5 Centimeter minimum pressure to 10 cm maximum pressure.  Please call us in about 6 weeks or better yet email through my chart for [place] to look at your compliance data on the new pressure settings and we will call you with an update.  Good job with your weight loss, I would like to see you back in follow-up in 6 months, sooner if needed. We will send the pressure change order to your DME company, [facility] and they should be able to do the changes remotely. You have to have the machine plugged in and turned on for the change to take place.

## 2018-12-15 NOTE — Progress Notes (Signed)
Order for pressure change to auto pap sent to [employer] via community message. Confirmation received that the order transmitted was successful.

## 2019-01-17 ENCOUNTER — Other Ambulatory Visit: Payer: Self-pay | Admitting: Family Medicine

## 2019-01-17 DIAGNOSIS — Z1231 Encounter for screening mammogram for malignant neoplasm of breast: Secondary | ICD-10-CM

## 2019-01-18 ENCOUNTER — Ambulatory Visit (INDEPENDENT_AMBULATORY_CARE_PROVIDER_SITE_OTHER): Payer: BC Managed Care – PPO | Admitting: Orthopaedic Surgery

## 2019-01-18 ENCOUNTER — Encounter: Payer: Self-pay | Admitting: Orthopaedic Surgery

## 2019-01-18 ENCOUNTER — Other Ambulatory Visit: Payer: Self-pay

## 2019-01-18 VITALS — BP 130/79 | HR 66 | Ht 62.0 in | Wt 145.0 lb

## 2019-01-18 DIAGNOSIS — S62616D Displaced fracture of proximal phalanx of right little finger, subsequent encounter for fracture with routine healing: Secondary | ICD-10-CM | POA: Diagnosis not present

## 2019-01-18 NOTE — Progress Notes (Signed)
Office Visit Note   Patient: Kristin Whitney           Date of Birth: 1960-08-02           MRN: [id number] Visit Date: 01/18/2019              Requested by: Susy Frizzle, MD [id number] [place],  [place] [id number] PCP: Susy Frizzle, MD   Assessment & Plan: Visit Diagnoses:  1. Closed displaced fracture of proximal phalanx of right little finger with routine healing, subsequent encounter     Plan: Finger fracture is healed she lacks 4 mm touching distal palmar crease.  She has full function of her finger with minimal swelling.  She is happy with the results of treatment with surgery and will return as needed.  Patient states that she has been active during the day occasionally in the finger may ache slightly.  Follow-Up Instructions: No follow-ups on file.   Orders:  No orders of the defined types were placed in this encounter.  No orders of the defined types were placed in this encounter.     Procedures: No procedures performed   Clinical Data: No additional findings.   Subjective: Chief Complaint  Patient presents with  . Right Little Finger - Follow-up    10/04/2018 Pinning Right 5th Finger Proximal Phalanx Fracture    HPI follow-up right fifth finger proximal phalanx pinning 10/04/2018.  She is worked hard on flexion is improved has full extension and lacks 4 to 5 mm touching DPC  Review of Systems updated and unchanged.   Objective: Vital Signs: BP 130/79   Pulse 66   Ht 5\' 2"  (1.575 m)   Wt 145 lb (65.8 kg)   BMI 26.52 kg/m   Physical Exam Constitutional:      Appearance: She is well-developed.  HENT:     Head: Normocephalic.     Right Ear: External ear normal.     Left Ear: External ear normal.  Eyes:     Pupils: Pupils are equal, round, and reactive to light.  Neck:     Thyroid: No thyromegaly.     Trachea: No tracheal deviation.  Cardiovascular:     Rate and Rhythm: Normal rate.  Pulmonary:     Effort: Pulmonary  effort is normal.  Abdominal:     Palpations: Abdomen is soft.  Skin:    General: Skin is warm and dry.  Neurological:     Mental Status: She is alert and oriented to person, place, and time.  Psychiatric:        Behavior: Behavior normal.     Ortho Exam no rotational deformity right fifth finger.  She lacks 4 mm touching distal palmar crease.  Some PIP flexion 15 degrees with attempted full extension.  Sensation of the fingertip is intact.  Specialty Comments:  No specialty comments available.  Imaging: No results found.   PMFS History: Patient Active Problem List   Diagnosis Date Noted  . Finger fracture, right 09/28/2018  . Gastritis   . Neck pain 08/14/2016  . Tonsillar calculus 08/14/2016  . 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 Irregular menstrual cycle 04/28/2016  . History of vitamin D deficiency 12/05/2014  . Perimenopause 12/05/2014  . History of anemia 06/07/2012  . Tiredness 06/07/2012  . Menstrual migraine 06/02/2011   Past Medical History:  Diagnosis Date  . ADHD (attention deficit hyperactivity disorder)   . Anemia   . Depression   .  Fibrocystic breast changes    left  . Gastritis    egd- 2018 Dr. Collene Mares    Family History  Problem Relation Age of Onset  . Hyperlipidemia Mother   . Alzheimer's disease Mother   . Breast cancer Maternal Aunt   . Breast cancer Maternal Aunt   . Stroke Father   . Alcohol abuse Father   . Breast cancer Cousin     Past Surgical History:  Procedure Laterality Date  . BREAST BIOPSY Left 2012  . GYNECOLOGIC CRYOSURGERY    . NASAL SEPTUM SURGERY  1994  . PELVIC LAPAROSCOPY  12/09/2008   PELVIC WASHINGS, EXCISION OF TORSED LEFT PARATUBULAR CYST. ROLLERBALL ENDOMETRIAL ABLATION   Social History   Occupational History  . Not on file  Tobacco Use  . Smoking status: Never Smoker  . Smokeless tobacco: Never Used  Substance and Sexual Activity  . Alcohol use: Yes    Alcohol/week: 0.0 standard drinks     Comment: WINE - rare  . Drug use: No  . Sexual activity: Yes    Birth control/protection: None, Other-see comments    Comment: PATIENT'S HUSBAND WITH VASECTOMY

## 2019-01-20 ENCOUNTER — Ambulatory Visit (INDEPENDENT_AMBULATORY_CARE_PROVIDER_SITE_OTHER): Payer: BC Managed Care – PPO | Admitting: Family Medicine

## 2019-01-20 ENCOUNTER — Encounter: Payer: Self-pay | Admitting: Family Medicine

## 2019-01-20 ENCOUNTER — Other Ambulatory Visit: Payer: Self-pay

## 2019-01-20 VITALS — BP 128/64 | HR 80 | Temp 97.9°F | Resp 14 | Ht 62.0 in | Wt 140.0 lb

## 2019-01-20 DIAGNOSIS — Z0000 Encounter for general adult medical examination without abnormal findings: Secondary | ICD-10-CM

## 2019-01-20 DIAGNOSIS — Z0001 Encounter for general adult medical examination with abnormal findings: Secondary | ICD-10-CM

## 2019-01-20 DIAGNOSIS — I8393 Asymptomatic varicose veins of bilateral lower extremities: Secondary | ICD-10-CM

## 2019-01-20 MED ORDER — DIAZEPAM 10 MG PO TABS: 10.0000 mg | ORAL_TABLET | Freq: Every evening | ORAL | 1 refills | Status: DC | PRN

## 2019-01-20 NOTE — Progress Notes (Signed)
Subjective:    Patient ID: Kristin Whitney, female    DOB: [birth year], 58 y.o.   MRN: [id number]  HPI Patient is a very pleasant 58 year old Caucasian female here today for complete physical exam.  Her last colonoscopy was in 2018 and is up-to-date until 2028.  She is due for her mammogram in December of this year and she is already made that appointment.  She will also schedule appoint with her gynecologist for her Pap smear as her last Pap smear has been 3 years ago.  Last year the patient was being evaluated for chronic fatigue.  She was ultimately determined to have severe obstructive sleep apnea with an apnea-hypopnea index above 50.  She is wearing CPAP and she feels much better since starting CPAP therapy.  She is also lost substantial weight since I last saw her.  She has lost over 40 pounds!.  I am very proud of the patient for accomplishing this.  She has no concerns today outside of small varicose veins particularly on her upper medial left ankle.  There is no ulcer or inflammation.  They do not hurt.  I recommended wearing compression hose and explained the natural history of these however the patient would like to meet with a vascular specialist.  She would also like her vitamin D checked Past Medical History:  Diagnosis Date  . ADHD (attention deficit hyperactivity disorder)   . Anemia   . Depression   . Fibrocystic breast changes    left  . Gastritis    egd- 2018 Dr. Collene Mares   Past Surgical History:  Procedure Laterality Date  . BREAST BIOPSY Left 2012  . GYNECOLOGIC CRYOSURGERY    . NASAL SEPTUM SURGERY  1994  . PELVIC LAPAROSCOPY  12/09/2008   PELVIC WASHINGS, EXCISION OF TORSED LEFT PARATUBULAR CYST. ROLLERBALL ENDOMETRIAL ABLATION   Current Outpatient Medications on File Prior to Visit  Medication Sig Dispense Refill  . albuterol (PROVENTIL HFA;VENTOLIN HFA) 108 (90 Base) MCG/ACT inhaler TAKE 2 PUFFS INTO LUNGS EVERY 6 HOURS AS NEEDED FOR WHEEZE OR SHORTNESS OF  BREATH 8.5 Inhaler 0  . Biotin 5000 MCG CAPS Take by mouth.    . Cholecalciferol 100 MCG (4000 UT) CAPS Take by mouth.    . COLLAGEN PO Take by mouth.    . Garlic Oil XX123456 MG TABS Take by mouth daily.    Marland Kitchen loratadine (CLARITIN) 10 MG tablet Take 10 mg by mouth daily.    . Medium Chain Triglycerides (MCT OIL PO) Take by mouth.    . Multiple Vitamin (MULTIVITAMIN) tablet Take 1 tablet by mouth daily.    . Probiotic Product (PROBIOTIC DAILY PO) Take by mouth.    . Safflower Oil (CLA) 1000 MG CAPS Take by mouth. TEIM- CLA AND COLLAGEN     No current facility-administered medications on file prior to visit.    Allergies  Allergen Reactions  . Clarithromycin   . Codeine Nausea And Vomiting   Social History   Socioeconomic History  . Marital status: Married    Spouse name: Not on file  . Number of children: Not on file  . Years of education: Not on file  . Highest education level: Not on file  Occupational History  . Not on file  Social Needs  . Financial resource strain: Not on file  . Food insecurity    Worry: Not on file    Inability: Not on file  . Transportation needs    Medical: Not on file  Non-medical: Not on file  Tobacco Use  . Smoking status: Never Smoker  . Smokeless tobacco: Never Used  Substance and Sexual Activity  . Alcohol use: Yes    Alcohol/week: 0.0 standard drinks    Comment: WINE - rare  . Drug use: No  . Sexual activity: Yes    Birth control/protection: None, Other-see comments    Comment: PATIENT'S HUSBAND WITH VASECTOMY  Lifestyle  . Physical activity    Days per week: Not on file    Minutes per session: Not on file  . Stress: Not on file  Relationships  . Social Herbalist on phone: Not on file    Gets together: Not on file    Attends religious service: Not on file    Active member of club or organization: Not on file    Attends meetings of clubs or organizations: Not on file    Relationship status: Not on file  . Intimate  partner violence    Fear of current or ex partner: Not on file    Emotionally abused: Not on file    Physically abused: Not on file    Forced sexual activity: Not on file  Other Topics Concern  . Not on file  Social History Narrative  . Not on file   Family History  Problem Relation Age of Onset  . Hyperlipidemia Mother   . Alzheimer's disease Mother   . Breast cancer Maternal Aunt   . Breast cancer Maternal Aunt   . Stroke Father   . Alcohol abuse Father   . Breast cancer Cousin       Review of Systems  All other systems reviewed and are negative.      Objective:   Physical Exam  Constitutional: She is oriented to person, place, and time. She appears well-developed and well-nourished. No distress.  HENT:  Head: Normocephalic and atraumatic.  Right Ear: External ear normal.  Left Ear: External ear normal.  Nose: Nose normal.  Mouth/Throat: Oropharynx is clear and moist. No oropharyngeal exudate.  Eyes: Pupils are equal, round, and reactive to light. Conjunctivae and EOM are normal. Right eye exhibits no discharge. Left eye exhibits no discharge. No scleral icterus.  Neck: Normal range of motion. Neck supple. No JVD present. No tracheal deviation present. No thyromegaly present.  Cardiovascular: Normal rate, regular rhythm, normal heart sounds and intact distal pulses. Exam reveals no gallop and no friction rub.  No murmur heard. Pulmonary/Chest: Effort normal and breath sounds normal. No stridor. No respiratory distress. She has no wheezes. She has no rales. She exhibits no tenderness.  Abdominal: Soft. Bowel sounds are normal. She exhibits no distension and no mass. There is no abdominal tenderness. There is no rebound and no guarding.  Musculoskeletal: Normal range of motion.        General: No tenderness, deformity or edema.  Lymphadenopathy:    She has no cervical adenopathy.  Neurological: She is alert and oriented to person, place, and time. She has normal  reflexes. No cranial nerve deficit. She exhibits normal muscle tone. Coordination normal.  Skin: Skin is warm. No rash noted. She is not diaphoretic. No erythema. No pallor.  Psychiatric: She has a normal mood and affect. Her behavior is normal. Judgment and thought content normal.  Vitals reviewed.         Assessment & Plan:  General medical exam - Plan: diazepam (VALIUM) 10 MG tablet, CBC with Differential, COMPLETE METABOLIC PANEL WITH GFR, Lipid Panel, Vitamin  D, 25-hydroxy  Varicose veins of both lower extremities without ulcer or inflammation - Plan: Ambulatory referral to Vascular Surgery  Patient's physical exam today is completely normal.  Physical exam is significant only for small cluster of varicose veins superior to the medial malleolus on her left ankle.  I recommended the patient wear knee-high compression hose however the patient would like to meet with a vein and vascular specialist this year due to her insurance expiring next year.  Therefore I will arrange this referral.  I refilled her Valium which she is using sparingly for anxiety or sleep.  I will check a CBC, CMP, and fasting lipid panel and per the patient's request I will also check a vitamin D level.  Patient will schedule her Pap smear and her mammogram.  Recommended a flu shot.

## 2019-01-21 LAB — COMPLETE METABOLIC PANEL WITH GFR
AG Ratio: 1.6 (calc) (ref 1.0–2.5)
ALT: 14 U/L (ref 6–29)
AST: 15 U/L (ref 10–35)
Albumin: 4.5 g/dL (ref 3.6–5.1)
Alkaline phosphatase (APISO): 71 U/L (ref 37–153)
BUN: 14 mg/dL (ref 7–25)
CO2: 25 mmol/L (ref 20–32)
Calcium: 9.9 mg/dL (ref 8.6–10.4)
Chloride: 101 mmol/L (ref 98–110)
Creat: 0.64 mg/dL (ref 0.50–1.05)
GFR, Est African American: 116 mL/min/{1.73_m2} (ref >=60)
GFR, Est Non African American: 100 mL/min/{1.73_m2} (ref >=60)
Globulin: 2.8 g/dL (calc) (ref 1.9–3.7)
Glucose, Bld: 92 mg/dL (ref 65–99)
Potassium: 4.3 mmol/L (ref 3.5–5.3)
Sodium: 140 mmol/L (ref 135–146)
Total Bilirubin: 0.5 mg/dL (ref 0.2–1.2)
Total Protein: 7.3 g/dL (ref 6.1–8.1)

## 2019-01-21 LAB — CBC WITH DIFFERENTIAL/PLATELET
Absolute Monocytes: 358 cells/uL (ref 200–950)
Basophils Absolute: 58 cells/uL (ref 0–200)
Basophils Relative: 0.8 %
Eosinophils Absolute: 58 cells/uL (ref 15–500)
Eosinophils Relative: 0.8 %
HCT: 43.4 % (ref 35.0–45.0)
Hemoglobin: 13.8 g/dL (ref 11.7–15.5)
Lymphs Abs: 1862 cells/uL (ref 850–3900)
MCH: 28 pg (ref 27.0–33.0)
MCHC: 31.8 g/dL — ABNORMAL LOW (ref 32.0–36.0)
MCV: 88.2 fL (ref 80.0–100.0)
MPV: 12.2 fL (ref 7.5–12.5)
Monocytes Relative: 4.9 %
Neutro Abs: 4964 cells/uL (ref 1500–7800)
Neutrophils Relative %: 68 %
Platelets: 271 10*3/uL (ref 140–400)
RBC: 4.92 10*6/uL (ref 3.80–5.10)
RDW: 13.1 % (ref 11.0–15.0)
Total Lymphocyte: 25.5 %
WBC: 7.3 10*3/uL (ref 3.8–10.8)

## 2019-01-21 LAB — LIPID PANEL
Cholesterol: 212 mg/dL — ABNORMAL HIGH (ref <200)
HDL: 76 mg/dL (ref >=50)
LDL Cholesterol (Calc): 115 mg/dL (calc) — ABNORMAL HIGH
Non-HDL Cholesterol (Calc): 136 mg/dL (calc) — ABNORMAL HIGH (ref <130)
Total CHOL/HDL Ratio: 2.8 (calc) (ref <5.0)
Triglycerides: 99 mg/dL (ref <150)

## 2019-01-21 LAB — VITAMIN D 25 HYDROXY (VIT D DEFICIENCY, FRACTURES): Vit D, 25-Hydroxy: 58 ng/mL (ref 30–100)

## 2019-01-26 ENCOUNTER — Telehealth: Payer: Self-pay

## 2019-01-26 NOTE — Telephone Encounter (Signed)
Received form from [facility] of Donor Participation in the Plasmapheresis Program. Given to Dr. Rexene Alberts for review.

## 2019-01-27 NOTE — Telephone Encounter (Signed)
Please advise pt that I have received a form For plasmapheresis donor program to be filled out, since she has sleep apnea.  Before I sign the form, I would like for her to be notified that she is not actually using her AutoPap Consistently. In the past 30 days she has used her machine only 3 days.  If she is okay, I will fill out the form but have to attest that she is not compliant with her machine currently. For better tolerance, we can try to switch her to CPAP of 9 cm perhaps. Does she want to try this for the next few weeks? I can Then review her download data again and we can fill out the form a little later, please ask patient and let me know.

## 2019-01-27 NOTE — Telephone Encounter (Signed)
Form completed, give to MR for completion. No charge for the form. Pt will pick up on Monday.

## 2019-01-27 NOTE — Telephone Encounter (Signed)
I called pt and had an extended conversation with pt. She reports that she has had a cold for the past week and that is why she is non-compliant. She has started using it more compliantly as of this weekend. She reports that she does not want the pressure changed. She wants Dr. Rexene Alberts to go ahead and fill it out and be honest on it but please put on there that pt has struggled with a cold and that has dropper her compliance score recently. She would like to pick this form up on Monday.

## 2019-01-27 NOTE — Telephone Encounter (Signed)
Form filled out with additional comment re: compliance.

## 2019-02-25 ENCOUNTER — Telehealth (HOSPITAL_COMMUNITY): Payer: Self-pay | Admitting: *Deleted

## 2019-02-25 NOTE — Telephone Encounter (Signed)

## 2019-02-27 ENCOUNTER — Other Ambulatory Visit: Payer: Self-pay

## 2019-02-27 DIAGNOSIS — I83893 Varicose veins of bilateral lower extremities with other complications: Secondary | ICD-10-CM

## 2019-02-28 ENCOUNTER — Ambulatory Visit (HOSPITAL_COMMUNITY)
Admission: RE | Admit: 2019-02-28 | Discharge: 2019-02-28 | Disposition: A | Discharge status: Home / Self Care | Payer: BC Managed Care – PPO | Source: Ambulatory Visit | Attending: Family | Admitting: Family

## 2019-02-28 ENCOUNTER — Other Ambulatory Visit: Payer: Self-pay

## 2019-02-28 ENCOUNTER — Ambulatory Visit: Payer: BC Managed Care – PPO | Admitting: Family

## 2019-02-28 ENCOUNTER — Encounter: Payer: Self-pay | Admitting: Family

## 2019-02-28 VITALS — BP 144/89 | HR 73 | Temp 97.9°F | Resp 16 | Ht 62.0 in | Wt 143.0 lb

## 2019-02-28 DIAGNOSIS — I872 Venous insufficiency (chronic) (peripheral): Secondary | ICD-10-CM | POA: Diagnosis not present

## 2019-02-28 DIAGNOSIS — I83893 Varicose veins of bilateral lower extremities with other complications: Secondary | ICD-10-CM | POA: Insufficient documentation

## 2019-02-28 NOTE — Patient Instructions (Signed)
  To decrease swelling in your feet and legs: Elevate feet above slightly bent knees, feet above heart, overnight and 3-4 times per day for 20 minutes.   

## 2019-02-28 NOTE — Progress Notes (Signed)
Referred by:  Susy Frizzle, MD [address] [place],  [place] [address]  Reason for referral: soreness and heaviness in calves  History of Present Illness  Kristin Whitney is a 58 y.o. (14-May-1960) female who presents with chief complaint: soreness and heaviness in calves.  She states that 2 paternal aunts died of DVT. She voices concern re soreness in calves for the last month that is no worse with walking, made worse by crossing legs. She also reports heaviness in both legs for a couple of years. It feels like her right calf has undergone strenuous exercise, but has has not exercised since March 2020 in a gym.   She has never worn compression hose.  Her legs do feel better with elevation.   She lost 40 pounds plus since she used CPAP and changed her diet.   She denies any skin changes, denies lymphedema. Denies problems with swelling in her legs.   Past Medical History:  Diagnosis Date  . ADHD (attention deficit hyperactivity disorder)   . Anemia   . Depression   . Fibrocystic breast changes    left  . Gastritis    egd- 2018 Dr. Collene Mares    Past Surgical History:  Procedure Laterality Date  . BREAST BIOPSY Left 2012  . GYNECOLOGIC CRYOSURGERY    . NASAL SEPTUM SURGERY  1994  . PELVIC LAPAROSCOPY  12/09/2008   PELVIC WASHINGS, EXCISION OF TORSED LEFT PARATUBULAR CYST. ROLLERBALL ENDOMETRIAL ABLATION    Social History   Socioeconomic History  . Marital status: Married    Spouse name: Not on file  . Number of children: Not on file  . Years of education: Not on file  . Highest education level: Not on file  Occupational History  . Not on file  Tobacco Use  . Smoking status: Never Smoker  . Smokeless tobacco: Never Used  Substance and Sexual Activity  . Alcohol use: Yes    Alcohol/week: 0.0 standard drinks    Comment: WINE - rare  . Drug use: No  . Sexual activity: Yes    Birth control/protection: None, Other-see comments    Comment:  PATIENT'S HUSBAND WITH VASECTOMY  Other Topics Concern  . Not on file  Social History Narrative  . Not on file   Social Determinants of Health   Financial Resource Strain:   . Difficulty of Paying Living Expenses: Not on file  Food Insecurity:   . Worried About Charity fundraiser in the Last Year: Not on file  . Ran Out of Food in the Last Year: Not on file  Transportation Needs:   . Lack of Transportation (Medical): Not on file  . Lack of Transportation (Non-Medical): Not on file  Physical Activity:   . Days of Exercise per Week: Not on file  . Minutes of Exercise per Session: Not on file  Stress:   . Feeling of Stress : Not on file  Social Connections:   . Frequency of Communication with Friends and Family: Not on file  . Frequency of Social Gatherings with Friends and Family: Not on file  . Attends Religious Services: Not on file  . Active Member of Clubs or Organizations: Not on file  . Attends Archivist Meetings: Not on file  . Marital Status: Not on file  Intimate Partner Violence:   . Fear of Current or Ex-Partner: Not on file  . Emotionally Abused: Not on file  . Physically Abused: Not on file  .  Sexually Abused: Not on file    Family History  Problem Relation Age of Onset  . Hyperlipidemia Mother   . Alzheimer's disease Mother   . Breast cancer Maternal Aunt   . Breast cancer Maternal Aunt   . Stroke Father   . Alcohol abuse Father   . Breast cancer Cousin     Current Outpatient Medications on File Prior to Visit  Medication Sig Dispense Refill  . albuterol (PROVENTIL HFA;VENTOLIN HFA) 108 (90 Base) MCG/ACT inhaler TAKE 2 PUFFS INTO LUNGS EVERY 6 HOURS AS NEEDED FOR WHEEZE OR SHORTNESS OF BREATH 8.5 Inhaler 0  . Biotin 5000 MCG CAPS Take by mouth.    . Cholecalciferol 100 MCG (4000 UT) CAPS Take by mouth.    . COLLAGEN PO Take by mouth.    . diazepam (VALIUM) 10 MG tablet Take 1 tablet (10 mg total) by mouth at bedtime as needed for anxiety or  sleep. 30 tablet 1  . Garlic Oil [id number] MG TABS Take by mouth daily.    Marland Kitchen loratadine (CLARITIN) 10 MG tablet Take 10 mg by mouth daily.    . Medium Chain Triglycerides (MCT OIL PO) Take by mouth.    . Multiple Vitamin (MULTIVITAMIN) tablet Take 1 tablet by mouth daily.    . Probiotic Product (PROBIOTIC DAILY PO) Take by mouth.    . Safflower Oil (CLA) 1000 MG CAPS Take by mouth. TEIM- CLA AND COLLAGEN     No current facility-administered medications on file prior to visit.    Allergies  Allergen Reactions  . Clarithromycin   . Codeine Nausea And Vomiting    REVIEW OF SYSTEMS: Cardiovascular: No chest pain, chest pressure, palpitations, orthopnea, or dyspnea on exertion. No claudication or rest pain,  No history of DVT or phlebitis. Pulmonary: No productive cough, asthma or wheezing. Neurologic: No weakness, aphasia, or amaurosis. No dizziness. + paraesthesia in both feet (she attributes to c-spine issues) Hematologic: No bleeding problems or clotting disorders. Musculoskeletal:+ joint pain since thrown off a horse in 2010, no joint swelling. Gastrointestinal: No blood in stool or hematemesis Genitourinary: No dysuria or hematuria. Psychiatric:: No history of major depression. Integumentary: No rashes or ulcers. Constitutional: No fever or chills.  Physical Examination Vitals:   02/28/19 1328  BP: (!) 144/89  Pulse: 73  Resp: 16  Temp: 97.9 F (36.6 C)  TempSrc: Temporal  SpO2: 94%  Weight: 143 lb (64.9 kg)  Height: 5\' 2"  (1.575 m)   Body mass index is 26.16 kg/m.  PHYSICAL EXAMINATION: General: Female appears younger than her stated age, in NAD  HEENT:  No gross abnormalities Pulmonary: Respirations are non-labored, good air movement in all fields, no rales, rhonchi, or wheezes Abdomen: Soft and non-tender with normal bowel sounds. Musculoskeletal: There are no major deformities.   Neurologic: No focal weakness or paresthesias are detected, muscle strength is 5/5 in  all extremities  Skin: There are no ulcer or rashes noted. Psychiatric: The patient has normal affect. Cardiovascular: There is a regular rate and rhythm without significant murmur appreciated.   Vascular: Vessel Right Left  Radial Palpable Palpable  Carotid  without bruit  without bruit  Aorta Not palpable N/A  Popliteal palpable Not palpable  PT Palpable Palpable  DP Palpable Palpable    Non-Invasive Vascular Imaging  BLE Venous Duplex (Date: 02/28/2019):  Venous Reflux Times +--------------+------+-----------+------------+--------+ RIGHT         RefluxReflux TimeDiameter cmsComments  Yes                                  +--------------+------+-----------+------------+--------+ GSV at [facility]      no                 0.64             +--------------+------+-----------+------------+--------+ GSV prox thigh  no                 0.39             +--------------+------+-----------+------------+--------+ GSV mid thigh   no                 0.38             +--------------+------+-----------+------------+--------+ GSV dist thigh  no                 0.41             +--------------+------+-----------+------------+--------+ GSV at knee     no                 0.41             +--------------+------+-----------+------------+--------+ GSV prox calf   no                 0.36             +--------------+------+-----------+------------+--------+ SSV Pop Fossa   no                 0.23             +--------------+------+-----------+------------+--------+    +--------------+------+-----------+------------+--------+ LEFT          RefluxReflux TimeDiameter cmsComments                Yes                                  +--------------+------+-----------+------------+--------+ GSV at SFJ     yes    >500 ms      0.72             +--------------+------+-----------+------------+--------+ GSV prox thigh yes     >500 ms      0.55             +--------------+------+-----------+------------+--------+ GSV mid thigh  yes    >500 ms      0.54             +--------------+------+-----------+------------+--------+ GSV dist thigh yes    >500 ms      0.48             +--------------+------+-----------+------------+--------+ GSV at knee    yes    >500 ms      0.48             +--------------+------+-----------+------------+--------+ GSV prox calf  yes    >500 ms      0.48             +--------------+------+-----------+------------+--------+ SSV Pop Fossa   no                 0.16             +--------------+------+-----------+------------+--------+  Summary: Right: There is no evidence of deep vein thrombosis in the lower extremity. There is no evidence of superficial venous thrombosis. There is no evidence of deep  vein reflux. There is no evidence of superficial vein reflux.  Left: There is no evidence of deep vein thrombosis in the lower extremity. There is no evidence of superficial venous thrombosis. There is no evidence of deep vein reflux. The great saphenous vein is not competent. There is no evidence of small saphenous vein reflux.   Medical Decision Making  Kristin Whitney is a 58 y.o. female who presents with: c/o soreness and heaviness in both calves.  Venous duplex today shows no DVT in either leg.  She has one varicose vein in left calf, few spider veins in distal thighs.  Her pedal pulses are palpable.    Based on the patient's history and examination, I recommend: avoid prolonged periods of sitting or standing, regular exercise, elevating feet above legs whenever possible, and thigh high compression hose.   I discussed with the patient the use of her 20-30 mm thigh high compression stockings and need for 3 month trial of such.  The patient will follow up in 3 months with Dr. Scot Dock or Dr. Oneida Alar in the [facility].  Thank you for  allowing [place] to participate in this patient's care.  Clemon Chambers, RN, MSN, FNP-C [facility] Office: [phone]  02/28/2019, 2:12 PM  Clinic MD: Trula Slade

## 2019-03-02 ENCOUNTER — Other Ambulatory Visit: Payer: Self-pay

## 2019-03-07 ENCOUNTER — Ambulatory Visit: Payer: BC Managed Care – PPO | Admitting: Women's Health

## 2019-03-07 ENCOUNTER — Encounter: Payer: Self-pay | Admitting: Women's Health

## 2019-03-07 ENCOUNTER — Other Ambulatory Visit: Payer: Self-pay

## 2019-03-07 VITALS — BP 132/82 | Ht 62.0 in | Wt 140.0 lb

## 2019-03-07 DIAGNOSIS — Z01419 Encounter for gynecological examination (general) (routine) without abnormal findings: Secondary | ICD-10-CM

## 2019-03-07 MED ORDER — ESTRADIOL 0.05 MG/24HR TD PTTW: 1.0000 | MEDICATED_PATCH | TRANSDERMAL | 4 refills | Status: DC

## 2019-03-07 MED ORDER — PROGESTERONE MICRONIZED 200 MG PO CAPS: 200.0000 mg | ORAL_CAPSULE | Freq: Every day | ORAL | 4 refills | Status: DC

## 2019-03-07 NOTE — Patient Instructions (Addendum)
It was good to see you today Vitamin D 2000 daily   Health Maintenance for Postmenopausal Women Menopause is a normal process in which your ability to get pregnant comes to an end. This process happens slowly over many months or years, usually between the ages of 34 and 59. Menopause is complete when you have missed your menstrual periods for 12 months. It is important to talk with your health care provider about some of the most common conditions that affect women after menopause (postmenopausal women). These include heart disease, cancer, and bone loss (osteoporosis). Adopting a healthy lifestyle and getting preventive care can help to promote your health and wellness. The actions you take can also lower your chances of developing some of these common conditions. What should I know about menopause? During menopause, you may get a number of symptoms, such as:  Hot flashes. These can be moderate or severe.  Night sweats.  Decrease in sex drive.  Mood swings.  Headaches.  Tiredness.  Irritability.  Memory problems.  Insomnia. Choosing to treat or not to treat these symptoms is a decision that you make with your health care provider. Do I need hormone replacement therapy?  Hormone replacement therapy is effective in treating symptoms that are caused by menopause, such as hot flashes and night sweats.  Hormone replacement carries certain risks, especially as you become older. If you are thinking about using estrogen or estrogen with progestin, discuss the benefits and risks with your health care provider. What is my risk for heart disease and stroke? The risk of heart disease, heart attack, and stroke increases as you age. One of the causes may be a change in the body's hormones during menopause. This can affect how your body uses dietary fats, triglycerides, and cholesterol. Heart attack and stroke are medical emergencies. There are many things that you can do to help prevent heart  disease and stroke. Watch your blood pressure  High blood pressure causes heart disease and increases the risk of stroke. This is more likely to develop in people who have high blood pressure readings, are of African descent, or are overweight.  Have your blood pressure checked: ? Every 3-5 years if you are 38-28 years of age. ? Every year if you are 36 years old or older. Eat a healthy diet   Eat a diet that includes plenty of vegetables, fruits, low-fat dairy products, and lean protein.  Do not eat a lot of foods that are high in solid fats, added sugars, or sodium. Get regular exercise Get regular exercise. This is one of the most important things you can do for your health. Most adults should:  Try to exercise for at least 150 minutes each week. The exercise should increase your heart rate and make you sweat (moderate-intensity exercise).  Try to do strengthening exercises at least twice each week. Do these in addition to the moderate-intensity exercise.  Spend less time sitting. Even light physical activity can be beneficial. Other tips  Work with your health care provider to achieve or maintain a healthy weight.  Do not use any products that contain nicotine or tobacco, such as cigarettes, e-cigarettes, and chewing tobacco. If you need help quitting, ask your health care provider.  Know your numbers. Ask your health care provider to check your cholesterol and your blood sugar (glucose). Continue to have your blood tested as directed by your health care provider. Do I need screening for cancer? Depending on your health history and family history, you  may need to have cancer screening at different stages of your life. This may include screening for:  Breast cancer.  Cervical cancer.  Lung cancer.  Colorectal cancer. What is my risk for osteoporosis? After menopause, you may be at increased risk for osteoporosis. Osteoporosis is a condition in which bone destruction happens  more quickly than new bone creation. To help prevent osteoporosis or the bone fractures that can happen because of osteoporosis, you may take the following actions:  If you are 2-16 years old, get at least 1,000 mg of calcium and at least 600 mg of vitamin D per day.  If you are older than age 72 but younger than age 50, get at least 1,200 mg of calcium and at least 600 mg of vitamin D per day.  If you are older than age 30, get at least 1,200 mg of calcium and at least 800 mg of vitamin D per day. Smoking and drinking excessive alcohol increase the risk of osteoporosis. Eat foods that are rich in calcium and vitamin D, and do weight-bearing exercises several times each week as directed by your health care provider. How does menopause affect my mental health? Depression may occur at any age, but it is more common as you become older. Common symptoms of depression include:  Low or sad mood.  Changes in sleep patterns.  Changes in appetite or eating patterns.  Feeling an overall lack of motivation or enjoyment of activities that you previously enjoyed.  Frequent crying spells. Talk with your health care provider if you think that you are experiencing depression. General instructions See your health care provider for regular wellness exams and vaccines. This may include:  Scheduling regular health, dental, and eye exams.  Getting and maintaining your vaccines. These include: ? Influenza vaccine. Get this vaccine each year before the flu season begins. ? Pneumonia vaccine. ? Shingles vaccine. ? Tetanus, diphtheria, and pertussis (Tdap) booster vaccine. Your health care provider may also recommend other immunizations. Tell your health care provider if you have ever been abused or do not feel safe at home. Summary  Menopause is a normal process in which your ability to get pregnant comes to an end.  This condition causes hot flashes, night sweats, decreased interest in sex, mood  swings, headaches, or lack of sleep.  Treatment for this condition may include hormone replacement therapy.  Take actions to keep yourself healthy, including exercising regularly, eating a healthy diet, watching your weight, and checking your blood pressure and blood sugar levels.  Get screened for cancer and depression. Make sure that you are up to date with all your vaccines. This information is not intended to replace advice given to you by your health care provider. Make sure you discuss any questions you have with your health care provider. Document Released: 04/18/2005 Document Revised: 02/17/2018 Document Reviewed: 02/17/2018 Elsevier Patient Education  2020 [employer].

## 2019-03-07 NOTE — Progress Notes (Signed)
Byrnece [facility] October 16, 1960 [id number]    History:    Presents for annual exam.  Last cycle 06/2018, irreg cycles year prior.  Having numerous hot flushes and poor sleep.  2010 benign endometrial polyp with ablation and benign cystectomy.  Husband vasectomy.  Approximately 20 years ago cryo with normal Paps after.  2012 benign breast biopsy.  History of anemia requiring iron infusions, none in the past 5 years.  2018 - colonoscopy Dr. Collene Mares.  Last office visit here 2018 has lost 40 pounds with diet and exercise.  Past medical history, past surgical history, family history and social history were all reviewed and documented in the EPIC chart.  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  Children are 59 and 8,  1 grandchild.  ROS:  A ROS was performed and pertinent positives and negatives are included.  Exam:  Vitals:   03/07/19 1536  BP: 132/82  Weight: 140 lb (63.5 kg)  Height: 5\' 2"  (1.575 m)   Body mass index is 25.61 kg/m.   General appearance:  Normal Thyroid:  Symmetrical, normal in size, without palpable masses or nodularity. Respiratory  Auscultation:  Clear without wheezing or rhonchi Cardiovascular  Auscultation:  Regular rate, without rubs, murmurs or gallops  Edema/varicosities:  Not grossly evident Abdominal  Soft,nontender, without masses, guarding or rebound.  Liver/spleen:  No organomegaly noted  Hernia:  None appreciated  Skin  Inspection:  Grossly normal   Breasts: Examined lying and sitting.     Right: Without masses, retractions, discharge or axillary adenopathy.     Left: Without masses, retractions, discharge or axillary adenopathy. Gentitourinary   Inguinal/mons:  Normal without inguinal adenopathy  External genitalia:  Normal  BUS/Urethra/Skene's glands:  Normal  Vagina:  Normal  Cervix:  Normal  Uterus:  normal in size, shape and contour.  Midline and mobile  Adnexa/parametria:     Rt: Without masses or tenderness.   Lt: Without masses or tenderness.  Anus  and perineum: Normal  Digital rectal exam: Normal sphincter tone without palpated masses or tenderness  Assessment/Plan:  58 y.o. MWF G2 P2 for annual exam with numerous hot flushes and poor sleep.  Amenorrheic 9 months with numerous hot flushes causing poor sleep Asthma, seasonal allergies History of hypertension no longer on medication due to 40 pound weight loss History of sleep apnea resolved with weight loss History of cryo greater than 20 years ago normal Paps after Labs-primary care  Plan: Menopause discussed, numerous hot flushes during office visit, options reviewed.  Will try Vivelle 0.05 patch twice weekly with instructions and Prometrium 200 mg at bedtime day 1 through 12 of each month.  HRT risks of blood clots, strokes and breast cancer reviewed,  instructed to call if no relief of symptoms.  SBEs, continue annual screening mammogram has scheduled in 2 days.  congratulated on weight loss, continue healthy diet, exercise, vitamin D 2000 daily encouraged..  Normal Pap 2017, Pap with HR HPV typing, new screening guidelines reviewed.    [place], 3:55 PM 03/07/2019

## 2019-03-08 LAB — PAP, TP IMAGING W/ HPV RNA, RFLX HPV TYPE 16,18/45: HPV DNA High Risk: NOT DETECTED

## 2019-03-09 ENCOUNTER — Ambulatory Visit
Admission: RE | Admit: 2019-03-09 | Discharge: 2019-03-09 | Disposition: A | Discharge status: Home / Self Care | Payer: BC Managed Care – PPO | Source: Ambulatory Visit | Attending: Family Medicine | Admitting: Family Medicine

## 2019-03-09 ENCOUNTER — Other Ambulatory Visit: Payer: Self-pay

## 2019-03-09 DIAGNOSIS — Z1231 Encounter for screening mammogram for malignant neoplasm of breast: Secondary | ICD-10-CM

## 2019-04-26 ENCOUNTER — Telehealth: Payer: Self-pay | Admitting: *Deleted

## 2019-04-26 MED ORDER — MEGESTROL ACETATE 40 MG PO TABS: 40.0000 mg | ORAL_TABLET | Freq: Two times a day (BID) | ORAL | 0 refills | Status: DC

## 2019-04-26 NOTE — Telephone Encounter (Signed)
Patient called started on HRT estradiol bi-weekly patch and progesterone 200 capsule day 1-12 monthly. Last month no bleeding with progesterone . This month she took progesterone as directed and on day 1 of Rx spotting started, now on day 16 of bleeding, no heavy, changing pad once a day. Reports you mentioned bleeding could occur, she wasn't sure how this bled last.  Please advise

## 2019-04-26 NOTE — Telephone Encounter (Signed)
Patient informed, Rx sent.  

## 2019-04-26 NOTE — Telephone Encounter (Signed)
Review common to have some bleeding but annoying after starting on HRT, could try Megace 40 mg twice daily for 10 days and have her call if bleeding persists.  This should stop the bleeding completely.

## 2019-06-02 ENCOUNTER — Ambulatory Visit: Payer: BC Managed Care – PPO | Admitting: Vascular Surgery

## 2019-06-09 ENCOUNTER — Ambulatory Visit: Payer: BC Managed Care – PPO | Admitting: Vascular Surgery

## 2019-06-13 ENCOUNTER — Encounter: Payer: Self-pay | Admitting: Neurology

## 2019-06-15 ENCOUNTER — Ambulatory Visit: Payer: BC Managed Care – PPO | Admitting: Vascular Surgery

## 2019-06-15 ENCOUNTER — Encounter: Payer: Self-pay | Admitting: Neurology

## 2019-06-15 ENCOUNTER — Ambulatory Visit: Payer: BC Managed Care – PPO | Admitting: Neurology

## 2019-06-15 ENCOUNTER — Other Ambulatory Visit: Payer: Self-pay

## 2019-06-15 ENCOUNTER — Encounter: Payer: Self-pay | Admitting: Vascular Surgery

## 2019-06-15 VITALS — BP 149/79 | HR 72 | Temp 98.5°F | Resp 20 | Ht 62.0 in | Wt 142.0 lb

## 2019-06-15 VITALS — BP 140/80 | HR 67 | Temp 98.7°F | Ht 62.0 in | Wt 143.1 lb

## 2019-06-15 DIAGNOSIS — I83893 Varicose veins of bilateral lower extremities with other complications: Secondary | ICD-10-CM | POA: Diagnosis not present

## 2019-06-15 DIAGNOSIS — G4733 Obstructive sleep apnea (adult) (pediatric): Secondary | ICD-10-CM

## 2019-06-15 DIAGNOSIS — Z789 Other specified health status: Secondary | ICD-10-CM

## 2019-06-15 NOTE — Progress Notes (Signed)
Subjective:    Patient ID: Kristin Whitney is a 59 y.o. female.  HPI     Interim history:   Kristin Whitney is a 59 year old right-handed woman with an underlying medical history of anemia, depression, history of gastritis, anxiety, ADHD and obesity, who presents for follow-up consultation of Kristin Whitney obstructive sleep apnea, on autoPAP therapy.  The patient is unaccompanied today. I last saw Kristin Whitney on 12/15/18, at which Time she reported that she had trouble tolerating the pressure.  She was on CPAP of 13 cm at the time.  She had been able to lose more weight.  I suggested we change Kristin Whitney settings to AutoPap with a maximum pressure of 10 cm.  Today, 06/15/2019: I reviewed Kristin Whitney AutoPap compliance data from the past 30 days from 05/15/2019 through 06/13/2019, during which time she used Kristin Whitney machine only 11 days with percent use days greater than 4 hours at 7% only, average usage for days on treatment of 2 hours and 36 minutes.  Residual AHI 1.4/h, average pressure of 8.7 cm, leak acceptable with a 95th percentile at 20.3 L/min with a pressure of 5 to 10 cm.  She reports having trouble tolerating the mask.  Under the nose full facemask is the most tolerable to Kristin Whitney but she has struggled with hot flashes and night sweats lately.  She has been placed on hormone replacement with progesterone pills and estrogen patches but still has difficulty tolerating the mask primarily.  She feels like she is either getting too much air or not enough oxygen and pulls of the mask in a panic at night.  She has been able to maintain Kristin Whitney weight loss and would be amenable to rechecking Kristin Whitney sleep apnea with a home sleep test.  She has not decided if she will get the Covid vaccine as yet, she continues to practice social distancing.  She works in the school system.     The patient's allergies, current medications, family history, past medical history, past social history, past surgical history and problem list were reviewed and updated as  appropriate.    Previously (copied from previous notes for reference):      I last saw Kristin Whitney on 06/09/2018 in virtual visit, at which time we talked about Kristin Whitney sleep study results.  She was compliant with CPAP and felt improved.  She was working on weight loss and had lost quite a bit of weight already.  We had reduced Kristin Whitney pressure in the interim to 15 cm and at the April appointment I suggested we reduce it further to 13 cm.   I reviewed Kristin Whitney CPAP compliance data from the past 30 days, during which time she used Kristin Whitney machine only 5 days.  In the past 90 days she used Kristin Whitney CPAP only 31 days with percent use days greater than 4 hours at 6%, indicating significantly low compliance with an average usage of 2 hours and 35 minutes, residual AHI 2.2, leak 18.1 L/min for the 95th percentile, pressure at 13 cm with EPR of 3.     I first met Kristin Whitney on 01/21/2018 at the request of Kristin Whitney primary care physician, at which time she reported snoring and excessive daytime somnolence, Epworth sleepiness score was 8 out of 24 at the time. She was advised to proceed with sleep testing. She had a baseline sleep study followed by a CPAP titration study. I went over Kristin Whitney test results with Kristin Whitney in detail today. Baseline sleep study from 03/01/2018 showed a sleep latency of 2.5 minutes,  sleep efficiency of 92%, REM latency was delayed at 165 minutes. She had a reduced percentage of REM sleep, she had an increased percentage of stage II sleep. Total AHI was elevated in the moderate range at 21.8 per hour, REM AHI was in the severe range at 39.1 per hour, supine AHI was also in the severe range at 45.3 per hour. Average oxygen saturation was 92%, nadir was 82%. She had no significant PLMS and no significant EKG changes. She was advised to return for a CPAP titration study. She had this on 03/18/2018. Sleep efficiency was 87.8%, sleep latency 13.5 minutes and REM latency mildly delayed at 159 minutes. She was fitted with a nasal mask and a full  facemask and preferred the full facemask, small Simplus. CPAP was titrated from 5 cm to 18 cm. On a pressure of 17 cm Kristin Whitney AHI was 8.6 per hour with supine REM sleep achieved an O2 nadir of 94%. She had no significant PLMS. Based on Kristin Whitney sleep study results I prescribed a home CPAP therapy at a pressure of 17 cm. She called in the interim in February 2020 reporting that the pressure felt too high, compliance data looked good, I suggested we reduce the pressure from 17 cm to 15 cm at the time.   I reviewed Kristin Whitney CPAP compliance data from 05/09/2018 through 06/07/2018 which is a total of 30 days, during which time she used Kristin Whitney machine every night with percent used days greater than 4 hours and 22 days, 73%, indicating adequate compliance with an average usage of 5 hours and 31 minutes, residual AHI at goal at 2.1 per hour, leak on the low side with the 95th percentile at 2.4 L/m on a pressure of 15 cm with EPR of 3.   01/21/2018: (She) reports snoring and excessive daytime somnolence. I reviewed your office note from 01/11/2018. Kristin Whitney Epworth sleepiness score is 8 out of 24 today, fatigue score is 44/63. She is married and lives with Kristin Whitney husband, they have 2 children. She works for [employer] system. She quit smoking in the 80s and smoked for another year in the 68s. She drinks alcohol occasionally, coffee 2 cups per day on average and otherwise no significant caffeine intake. She has had difficulty maintaining sleep. She has multiple nighttime awakenings. She has tried Valium for anxiety at night which has been helpful. Nevertheless she still wakes up at night. She has had morning headaches. She has nocturia about once per average night, not aware of any family history of OSA. She has had restless leg symptoms off-and-on, much more pronounced when she was severely anemic in the past. She has even seen hematology for Kristin Whitney severe anemia. She had PLM's in the past per husband's report. 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 Bedtime is around 9 and rise time around 5. She does not watch TV in Kristin Whitney bedroom, she has a dog in the bedroom but not in bed with Kristin Whitney. She has had trouble losing weight. She had recent blood work on 01/11/18, which showed normal B12, neg Lyme, ESR, Vit D lower normal at 41, TSH. Not aware of a FHx of OSA, has had RLS, especially when she was severely anemic. PLMs were reported per husband in the past too.  Kristin Whitney Past Medical History Is Significant For: Past Medical History:  Diagnosis Date  . ADHD (attention deficit hyperactivity disorder)   . Anemia   . Depression   . Fibrocystic breast changes  left  . Gastritis    egd- 2018 Dr. Collene Mares  . OSA on CPAP     Kristin Whitney Past Surgical History Is Significant For: Past Surgical History:  Procedure Laterality Date  . BREAST BIOPSY Left 2012  . FINGER SURGERY  2020  . GYNECOLOGIC CRYOSURGERY    . NASAL SEPTUM SURGERY  1994  . PELVIC LAPAROSCOPY  12/09/2008   PELVIC WASHINGS, EXCISION OF TORSED LEFT PARATUBULAR CYST. ROLLERBALL ENDOMETRIAL ABLATION    Kristin Whitney Family History Is Significant For: Family History  Problem Relation Age of Onset  . Hyperlipidemia Mother   . Alzheimer's disease Mother   . Breast cancer Maternal Aunt   . Breast cancer Maternal Aunt   . Stroke Father   . Alcohol abuse Father   . Breast cancer Cousin     Kristin Whitney Social History Is Significant For: Social History   Socioeconomic History  . Marital status: Married    Spouse name: Not on file  . Number of children: Not on file  . Years of education: Not on file  . Highest education level: Not on file  Occupational History  . Not on file  Tobacco Use  . Smoking status: Never Smoker  . Smokeless tobacco: Never Used  Substance and Sexual Activity  . Alcohol use: Yes    Alcohol/week: 0.0 standard drinks    Comment: WINE - rare  . Drug use: No  . Sexual activity: Yes    Birth control/protection: None, Other-see comments    Comment:  PATIENT'S HUSBAND WITH VASECTOMY  Other Topics Concern  . Not on file  Social History Narrative  . Not on file   Social Determinants of Health   Financial Resource Strain:   . Difficulty of Paying Living Expenses:   Food Insecurity:   . Worried About Charity fundraiser in the Last Year:   . Arboriculturist in the Last Year:   Transportation Needs:   . Film/video editor (Medical):   Marland Kitchen Lack of Transportation (Non-Medical):   Physical Activity:   . Days of Exercise per Week:   . Minutes of Exercise per Session:   Stress:   . Feeling of Stress :   Social Connections:   . Frequency of Communication with Friends and Family:   . Frequency of Social Gatherings with Friends and Family:   . Attends Religious Services:   . Active Member of Clubs or Organizations:   . Attends Archivist Meetings:   Marland Kitchen Marital Status:     Kristin Whitney Allergies Are:  Allergies  Allergen Reactions  . Clarithromycin   . Codeine Nausea And Vomiting  :   Kristin Whitney Current Medications Are:  Outpatient Encounter Medications as of 06/15/2019  Medication Sig  . albuterol (PROVENTIL HFA;VENTOLIN HFA) 108 (90 Base) MCG/ACT inhaler TAKE 2 PUFFS INTO LUNGS EVERY 6 HOURS AS NEEDED FOR WHEEZE OR SHORTNESS OF BREATH  . Biotin 5000 MCG CAPS Take by mouth.  . Cholecalciferol 100 MCG (4000 UT) CAPS Take by mouth.  . COLLAGEN PO Take by mouth.  . diazepam (VALIUM) 10 MG tablet Take 1 tablet (10 mg total) by mouth at bedtime as needed for anxiety or sleep.  Marland Kitchen estradiol (VIVELLE-DOT) 0.05 MG/24HR patch Place 1 patch (0.05 mg total) onto the skin 2 (two) times a week.  . Garlic Oil 291 MG TABS Take by mouth daily.  Marland Kitchen loratadine (CLARITIN) 10 MG tablet Take 10 mg by mouth daily.  . Medium Chain Triglycerides (MCT OIL PO) Take  by mouth.  . megestrol (MEGACE) 40 MG tablet Take 1 tablet (40 mg total) by mouth 2 (two) times daily.  . Multiple Vitamin (MULTIVITAMIN) tablet Take 1 tablet by mouth daily.  . Probiotic Product  (PROBIOTIC DAILY PO) Take by mouth.  . progesterone (PROMETRIUM) 200 MG capsule Take 1 capsule (200 mg total) by mouth daily. First 12 days of the month at bedtime  . Safflower Oil (CLA) 1000 MG CAPS Take by mouth. TEIM- CLA AND COLLAGEN   No facility-administered encounter medications on file as of 06/15/2019.  :  Review of Systems:  Out of a complete 14 point review of systems, all are reviewed and negative with the exception of these symptoms as listed below: Review of Systems  Neurological:       06/15/19 Patient here for 6 month FU OSA on CPAP, [facility].  Going through menopause, taking mask off because I am so hot. I panic feeling like I can't get oxygen. I think it's just me not the machine.    Objective:  Neurological Exam  Physical Exam Physical Examination:   Vitals:   06/15/19 1457  BP: 140/80  Pulse: 67  Temp: 98.7 F (37.1 C)    General Examination: The patient is a very pleasant 59 y.o. female in no acute distress. She appears well-developed and well-nourished and well groomed.   HEENT:Normocephalic, atraumatic, pupils are equal, round and reactive to light, extraocular tracking is good without limitation to gaze excursion or nystagmus noted. Hearing is grossly intact. Face is symmetric with normal facial animation. Speech is clear with no dysarthria noted. There is no hypophonia. There is no lip, neck/head, jaw or voice tremor. Neck is supple with full range of passive and active motion. There are no carotid bruits on auscultation. Oropharynx exam reveals: mildmouth dryness, adequatedental hygiene and moderateairway crowding. Tongue protrudes centrally and palate elevates symmetrically.    Chest:Clear to auscultation without wheezing, rhonchi or crackles noted.  Heart:S1+S2+0, regular and normal without murmurs, rubs or gallops noted.   Abdomen:Soft, non-tender and non-distended.  Extremities:There isnoedema in the distal lower extremities bilaterally.    Skin: Warm and dry without trophic changes noted.  Musculoskeletal: exam reveals no obvious joint deformities, tenderness or joint swelling or erythema.   Neurologically:  Mental status: The patient is awake, alert and oriented in all 4 spheres.Herimmediate and remote memory, attention, language skills and fund of knowledge are appropriate. There is no evidence of aphasia, agnosia, apraxia or anomia. Speech is clear with normal prosody and enunciation. Thought process is linear. Mood is normaland affect is normal.  Cranial nerves II - XII are as described above under HEENT exam. Motor exam: Normal bulk, strength and tone is noted. Romberg Neg. There is no tremor or rebound. Fine motor skills and coordination:grosslyintact.  Cerebellar testing: No dysmetria or intention tremor. There is no truncal or gait ataxia.  Sensory exam: intact to light touchinthe upper and lower extremities.  Gait, station and balance:Shestands easily. No veering to one side is noted. No leaning to one side is noted. Posture is age-appropriate and stance is narrow based. Gait showsnormalstride length and normalpace. No problems turning are noted. Normal Tandem gait.   Assessment and Plan:  In summary,Kristin G Lunsfordis a very pleasant 69 year oldfemalewith an underlying medical history of anemia, depression, history of gastritis,allergies,anxiety, ADHD and mildly overweight state, whopresents for Follow-up consultation of Kristin Whitney obstructive sleep apnea.  She had a baseline sleep study in December 2019 and a titration study in January 2020.  She had overall moderate to severe obstructive sleep apnea, and Kristin Whitney original pressure was rather high at 17 and we reduced it with better tolerance to 15 and in April 2020 because of weight loss achieved. We were able to reduce it further to 13. In October 2020 we changed Kristin Whitney treatment modality to AutoPap and reduced Kristin Whitney pressure range with minimum pressure Of 5 cm,  maximum of 10 cm.  She is currently struggling with AutoPap tolerance secondary to feeling hot and sweaty at night.  She is advised that we can proceed with a home sleep test to reevaluate the sleep apnea as she has had quite a bit of weight loss success.  She is agreeable.  We will keep Kristin Whitney posted as to Kristin Whitney test results and take it from there. I answered all Kristin Whitney questions today and she was in agreement.

## 2019-06-15 NOTE — Progress Notes (Signed)
Patient name: Kristin Whitney MRN: [id number] DOB: February 04, 1961 Sex: female  REASON FOR VISIT:   Follow-up of venous disease.  HPI:   Kristin Whitney is a pleasant 60 y.o. female who was seen by the nurse practitioner in our office on 02/28/2019.  She comes in for 77-month follow-up visit.  On my history she describes significant aching pain and heaviness in both legs which is aggravated by standing and relieved somewhat with elevation.  Her symptoms are more significant on the left side.  Her job requires her to sit for prolonged periods of time.  She denies any previous history of DVT.  She has had no previous venous procedures.  She has been wearing her compression stockings which helped some.  Past Medical History:  Diagnosis Date  . ADHD (attention deficit hyperactivity disorder)   . Anemia   . Depression   . Fibrocystic breast changes    left  . Gastritis    egd- 2018 Dr. Collene Mares    Family History  Problem Relation Age of Onset  . Hyperlipidemia Mother   . Alzheimer's disease Mother   . Breast cancer Maternal Aunt   . Breast cancer Maternal Aunt   . Stroke Father   . Alcohol abuse Father   . Breast cancer Cousin     SOCIAL HISTORY: Social History   Tobacco Use  . Smoking status: Never Smoker  . Smokeless tobacco: Never Used  Substance Use Topics  . Alcohol use: Yes    Alcohol/week: 0.0 standard drinks    Comment: WINE - rare    Allergies  Allergen Reactions  . Clarithromycin   . Codeine Nausea And Vomiting    Current Outpatient Medications  Medication Sig Dispense Refill  . albuterol (PROVENTIL HFA;VENTOLIN HFA) 108 (90 Base) MCG/ACT inhaler TAKE 2 PUFFS INTO LUNGS EVERY 6 HOURS AS NEEDED FOR WHEEZE OR SHORTNESS OF BREATH 8.5 Inhaler 0  . Biotin 5000 MCG CAPS Take by mouth.    . Cholecalciferol 100 MCG (4000 UT) CAPS Take by mouth.    . COLLAGEN PO Take by mouth.    . diazepam (VALIUM) 10 MG tablet Take 1 tablet (10 mg total) by mouth at bedtime  as needed for anxiety or sleep. 30 tablet 1  . estradiol (VIVELLE-DOT) 0.05 MG/24HR patch Place 1 patch (0.05 mg total) onto the skin 2 (two) times a week. 24 patch 4  . Garlic Oil [id number] MG TABS Take by mouth daily.    Marland Kitchen loratadine (CLARITIN) 10 MG tablet Take 10 mg by mouth daily.    . Medium Chain Triglycerides (MCT OIL PO) Take by mouth.    . megestrol (MEGACE) 40 MG tablet Take 1 tablet (40 mg total) by mouth 2 (two) times daily. 20 tablet 0  . Multiple Vitamin (MULTIVITAMIN) tablet Take 1 tablet by mouth daily.    . Probiotic Product (PROBIOTIC DAILY PO) Take by mouth.    . progesterone (PROMETRIUM) 200 MG capsule Take 1 capsule (200 mg total) by mouth daily. First 12 days of the month at bedtime 36 capsule 4  . Safflower Oil (CLA) 1000 MG CAPS Take by mouth. TEIM- CLA AND COLLAGEN     No current facility-administered medications for this visit.    REVIEW OF SYSTEMS:  [X]  denotes positive finding, [ ]  denotes negative finding Cardiac  Comments:  Chest pain or chest pressure:    Shortness of breath upon exertion:    Short of breath when lying flat:    Irregular heart  rhythm:        Vascular    Pain in calf, thigh, or hip brought on by ambulation:    Pain in feet at night that wakes you up from your sleep:     Blood clot in your veins:    Leg swelling:  x       Pulmonary    Oxygen at home:    Productive cough:     Wheezing:         Neurologic    Sudden weakness in arms or legs:     Sudden numbness in arms or legs:     Sudden onset of difficulty speaking or slurred speech:    Temporary loss of vision in one eye:     Problems with dizziness:         Gastrointestinal    Blood in stool:     Vomited blood:         Genitourinary    Burning when urinating:     Blood in urine:        Psychiatric    Major depression:         Hematologic    Bleeding problems:    Problems with blood clotting too easily:        Skin    Rashes or ulcers:        Constitutional    Fever  or chills:     PHYSICAL EXAM:   Vitals:   06/15/19 1257  BP: (!) 149/79  Pulse: 72  Resp: 20  Temp: 98.5 F (36.9 C)  SpO2: 96%  Weight: 142 lb (64.4 kg)  Height: 5\' 2"  (1.575 m)    GENERAL: The patient is a well-nourished female, in no acute distress. The vital signs are documented above. CARDIAC: There is a regular rate and rhythm.  VASCULAR: I do not detect carotid bruits. She has palpable dorsalis pedis and posterior tibial pulses bilaterally. She has some telangiectasias in both lower extremities but no large truncal varicosities. I did look at the left great saphenous vein myself and she does have significant reflux in the great saphenous vein in the thigh.  The vein is dilated.  This could likely be cannulated in the mid to distal thigh. PULMONARY: There is good air exchange bilaterally without wheezing or rales. ABDOMEN: Soft and non-tender with normal pitched bowel sounds.  MUSCULOSKELETAL: There are no major deformities or cyanosis. NEUROLOGIC: No focal weakness or paresthesias are detected. SKIN: There are no ulcers or rashes noted. PSYCHIATRIC: The patient has a normal affect.  DATA:    VENOUS DUPLEX: I reviewed her previous venous duplex scan from December of last year.  On the right side there is no evidence of DVT and no evidence of superficial venous thrombosis.  There was no deep venous reflux or superficial venous reflux.  On the left side there was no evidence of DVT or superficial venous thrombosis.  There was no deep venous reflux.  There was superficial venous reflux in the great saphenous vein from the saphenofemoral junction to the knee.  The vein was significantly dilated up to 0.55 cm in the proximal thigh.  MEDICAL ISSUES:   SUPERFICIAL VENOUS REFLUX: This patient has significant superficial venous reflux in the left great saphenous vein which I think largely explains her symptoms.  She has CEAP C1 venous disease.  We have discussed the importance of  intermittent leg elevation the proper positioning for this.  In addition I encouraged her to avoid prolonged sitting  and standing.  She will continue to wear her compression stockings.  We discussed importance of exercise specifically walking and water aerobics.  If her symptoms progress I think she would be a good candidate for laser ablation of the left great saphenous vein.  She will call if these conservative measures do not improve her symptoms.  Kristin Whitney [facility] [phone]

## 2019-06-15 NOTE — Patient Instructions (Signed)
I am sorry to hear that you are having trouble tolerating your AutoPap.  You have done a great job with your weight and have been able to maintain your weight in a good range.  Let us look at your sleep apnea again with a home sleep test to get a feel for where you stand with your sleep apnea diagnosis, there is a good chance that your sleep apnea is improved because of your weight loss.  We will call you to schedule your home sleep test pickup date for your equipment to take home and we will call you with your home sleep test results.

## 2019-06-16 ENCOUNTER — Encounter: Payer: Self-pay | Admitting: Physician Assistant

## 2019-06-16 ENCOUNTER — Ambulatory Visit: Payer: BC Managed Care – PPO | Admitting: Physician Assistant

## 2019-06-16 DIAGNOSIS — L720 Epidermal cyst: Secondary | ICD-10-CM | POA: Diagnosis not present

## 2019-06-16 DIAGNOSIS — Z1283 Encounter for screening for malignant neoplasm of skin: Secondary | ICD-10-CM

## 2019-06-16 DIAGNOSIS — J330 Polyp of nasal cavity: Secondary | ICD-10-CM

## 2019-06-16 MED ORDER — MUPIROCIN CALCIUM 2 % EX CREA: 1.0000 "application " | TOPICAL_CREAM | Freq: Two times a day (BID) | CUTANEOUS | 3 refills | Status: DC

## 2019-06-21 ENCOUNTER — Telehealth: Payer: Self-pay

## 2019-06-21 ENCOUNTER — Encounter: Payer: Self-pay | Admitting: Physician Assistant

## 2019-06-21 NOTE — Telephone Encounter (Signed)
Phone call to patient with her Bacterial Culture results.  Results given to patient.

## 2019-06-21 NOTE — Progress Notes (Deleted)
   Follow-Up Visit   Subjective  Kristin Whitney is a 59 y.o. female who presents for the following: Annual Exam (right nare bump for 1 month, ). Labia majora bilateral small white bumps, increasing in number, some get irritated. She has popped one with white discharge. No creams make them go away. The following portions of the chart were reviewed this encounter and updated as appropriate: Tobacco  Allergies  Meds  Problems  Med Hx  Surg Hx  Fam Hx      Objective  Well appearing patient in no apparent distress; mood and affect are within normal limits.  A full examination was performed including scalp, head, eyes, ears, nose, lips, neck, chest, axillae, abdomen, back, buttocks, bilateral upper extremities, bilateral lower extremities, hands, feet, fingers, toes, fingernails, and toenails. All findings within normal limits unless otherwise noted below.  Objective  Left Labium Majus (2), Right Genitocrural Fold, Right Labium Majus: White cyst  Objective  Right intranasal: papule  Objective  head to toe: No DN, no signs of nmsc  Assessment & Plan  Milia  Other Related Procedures Anaerobic and Aerobic Culture  I& D x1 L labia majora

## 2019-06-21 NOTE — Telephone Encounter (Signed)
-----   Message from Warren Danes, [place] sent at 06/21/2019  4:08 PM EDT ----- No infection

## 2019-06-22 LAB — ANAEROBIC AND AEROBIC CULTURE
AER RESULT:: NO GROWTH
MICRO NUMBER:: 10341656
MICRO NUMBER:: 10341657
SPECIMEN QUALITY:: ADEQUATE
SPECIMEN QUALITY:: ADEQUATE

## 2019-06-23 ENCOUNTER — Telehealth: Payer: Self-pay

## 2019-06-23 NOTE — Telephone Encounter (Signed)
Bacteria culture is in my chart patient is active in mychart & results are open any further questions call the office.

## 2019-06-23 NOTE — Telephone Encounter (Signed)
-----   Message from Warren Danes, [place] sent at 06/23/2019  1:03 PM EDT ----- negative

## 2019-07-04 ENCOUNTER — Ambulatory Visit (INDEPENDENT_AMBULATORY_CARE_PROVIDER_SITE_OTHER): Payer: BC Managed Care – PPO | Admitting: Neurology

## 2019-07-04 DIAGNOSIS — G4733 Obstructive sleep apnea (adult) (pediatric): Secondary | ICD-10-CM | POA: Diagnosis not present

## 2019-07-04 DIAGNOSIS — Z789 Other specified health status: Secondary | ICD-10-CM

## 2019-07-07 ENCOUNTER — Telehealth: Payer: Self-pay

## 2019-07-07 DIAGNOSIS — G4733 Obstructive sleep apnea (adult) (pediatric): Secondary | ICD-10-CM

## 2019-07-07 NOTE — Progress Notes (Signed)
Patient was last seen on 06/15/19. She has been on autoPAP but has difficulty with tolerance. She had significant weight loss and had previously been diagnosed with mod to severe OSA in 2019.  She had a HST for re-eval on 07/04/19: Please call and notify the patient that the recent home sleep test showed only mild residual obstructive sleep apnea with a total AHI of 8/hour and O2 nadir of 90%. Given the patient's difficulty with autoPAP tolerance, she can consider stopping her autoPAP at this point and continue to maintain healthy weight and avoid sleeping on her back. Snoring appeared to be intermittent and mostly in the mild range. Alternative treatment may be in the form of an oral appliance, if she wishes. If she would like to D/C autoPAP, we can send an order to her DME and see her in FU prn.  If she would like to look into an oral appl, we can make a referral to dentistry. Let me know.

## 2019-07-07 NOTE — Procedures (Signed)
Patient Information     First Name: Kristin Last Name: Whitney ID: [id number]  Birth Date: 23-Apr-1960 Age: 59 Gender: Female  Referring Provider: Susy Frizzle, MD BMI: 26.4 (W=143 lb, H=5' 2'')  Neck Circ.:  15 '' Epworth:  8/24   Sleep Study Information    Study Date: Jul 04, 2019 S/H/A Version: 001.001.001.001 / 4.1.1528 / 15  History:    59 year old woman with a history of anemia, depression, history of gastritis, anxiety, ADHD and obesity, who presents for re-evaluation of her obstructive sleep apnea, on autoPAP therapy. She has had significant weight loss. She has difficulty tolerating her autoPAP.  Summary & Diagnosis:     Mild OSA Recommendations:     This home sleep test demonstrates mild residual obstructive sleep apnea with a total AHI of 8/hour and O2 nadir of 90%. Given the patient's difficulty with autoPAP tolerance, she can consider stopping her autoPAP at this point and continue to maintain healthy weight and avoid sleeping on her back. Snoring appeared to be intermittent and mostly in the mild range. Alternative treatment may be in the form of an oral appliance, if she wishes.    Please note that untreated obstructive sleep apnea may carry additional perioperative morbidity. Patients with significant obstructive sleep apnea should receive perioperative PAP therapy and the surgeons and particularly the anesthesiologist should be informed of the diagnosis and the severity of the sleep disordered breathing. The patient should be cautioned not to drive, work at heights, or operate dangerous or heavy equipment when tired or sleepy. Review and reiteration of good sleep hygiene measures should be pursued with any patient. Other causes of the patient's symptoms, including circadian rhythm disturbances, an underlying mood disorder, medication effect and/or an underlying medical problem cannot be ruled out based on this test. Clinical correlation is recommended.   The patient and his  referring provider will be notified of the test results. The patient will be seen in follow up in sleep clinic at [facility], if needed.  I certify that I have reviewed the raw data recording prior to the issuance of this report in accordance with the standards of the American Academy of Sleep Medicine (AASM).  [name], MD, PhD [facility]) Diplomat, ABPN (Neurology and Sleep)              Sleep Summary    Oxygen Saturation Statistics     Start Study Time: End Study Time: Total Recording Time:  9:31:46 PM 5:50:52 AM   8 h, 19 min  Total Sleep Time % REM of Sleep Time:  7 h, 1 min  22.2    Mean: 95 Minimum: 90 Maximum: 99  Mean of Desaturations Nadirs (%):   92  Oxygen Desaturation. %:   4-9 10-20 >20 Total  Events Number Total    13  1 92.9 7.1  0 0.0  14 100.0  Oxygen Saturation: <90 <=88 <85 <80 <70  Duration (minutes): Sleep % 0.0 0.0  0.0 0.0  0.0 0.0 0.0 0.0 0.0 0.0     Respiratory Indices      Total Events REM NREM All Night  pRDI:  68  pAHI:  53 ODI:  14  pAHIc:  2  % CSR: 0.0 12.5 10.2 2.4 0.0 9.8 7.5 2.1 0.4 10.3 8.0 2.1 0.3       Pulse Rate Statistics during Sleep (BPM)      Mean: 59 Minimum: 51 Maximum:  92    Indices  are calculated using technically valid sleep time of 6 h, 35 min. pRDI/pAHI are calculated using oxi desaturations ? 3%  Body Position Statistics  Position Supine Prone Right Left Non-Supine  Sleep (min) 211.0 0.0 207.5 3.0 210.5  Sleep % 50.1 0.0 49.2 0.7 49.9  pRDI 18.0 N/A 2.8 N/A 2.7  pAHI 15.0 N/A 1.2 N/A 1.2  ODI 4.3 N/A 0.0 N/A 0.0     Snoring Statistics Snoring Level (dB) >40 >50 >60 >70 >80 >Threshold (45)  Sleep (min) 71.2 4.1 1.6 0.0 0.0 7.4  Sleep % 16.9 1.0 0.4 0.0 0.0 1.8    Mean: 40 dB Sleep Stages Chart             pAHI=8.0                         Mild              Moderate                    Severe                                                 5               15                    30

## 2019-07-07 NOTE — Telephone Encounter (Signed)
D/C autoPAP order placed. Referral placed to Dr. Corky Sing for Oral appl consultation

## 2019-07-07 NOTE — Telephone Encounter (Signed)
D/c order has been sent to [facility].

## 2019-07-07 NOTE — Telephone Encounter (Signed)
-----   Message from [name], MD sent at 07/07/2019  8:07 AM EDT ----- Patient was last seen on 06/15/19. She has been on autoPAP but has difficulty with tolerance. She had significant weight loss and had previously been diagnosed with mod to severe OSA in 2019.  She had a HST for re-eval on 07/04/19: Please call and notify the patient that the recent home sleep test showed only mild residual obstructive sleep apnea with a total AHI of 8/hour and O2 nadir of 90%. Given the patient's difficulty with autoPAP tolerance, she can consider stopping her autoPAP at this point and continue to maintain healthy weight and avoid sleeping on her back. Snoring appeared to be intermittent and mostly in the mild range. Alternative treatment may be in the form of an oral appliance, if she wishes. If she would like to D/C autoPAP, we can send an order to her DME and see her in FU prn.  If she would like to look into an oral appl, we can make a referral to dentistry. Let me know.

## 2019-07-07 NOTE — Telephone Encounter (Signed)
I called the pt and we reviewed result. Pt verbalized understanding and is agreeable to d/c auto pap and trying the oral appliance, pt would like referral to be placed.  I have sent a message to the DME [facility] letting them we are d/c auto pap.

## 2019-07-11 NOTE — Telephone Encounter (Signed)
Noted  

## 2019-07-12 NOTE — Telephone Encounter (Signed)
Received this message from [place] in regards to d/c order for auto pap.   Will inform pt. But fyi, her ins purchased her machine and she will get to keep it.

## 2019-08-16 ENCOUNTER — Other Ambulatory Visit: Payer: Self-pay

## 2019-08-17 ENCOUNTER — Encounter: Payer: Self-pay | Admitting: Nurse Practitioner

## 2019-08-17 ENCOUNTER — Ambulatory Visit: Payer: BC Managed Care – PPO | Admitting: Nurse Practitioner

## 2019-08-17 VITALS — BP 136/80

## 2019-08-17 DIAGNOSIS — N951 Menopausal and female climacteric states: Secondary | ICD-10-CM

## 2019-08-17 DIAGNOSIS — Z9889 Other specified postprocedural states: Secondary | ICD-10-CM

## 2019-08-17 DIAGNOSIS — R140 Abdominal distension (gaseous): Secondary | ICD-10-CM | POA: Diagnosis not present

## 2019-08-17 DIAGNOSIS — R1032 Left lower quadrant pain: Secondary | ICD-10-CM | POA: Diagnosis not present

## 2019-08-17 DIAGNOSIS — Z8742 Personal history of other diseases of the female genital tract: Secondary | ICD-10-CM

## 2019-08-17 NOTE — Progress Notes (Signed)
   Acute Office Visit  Subjective:    Patient ID: Kristin Whitney, female    DOB: 01-Feb-1961, 59 y.o.   MRN: [id number]  Chief Complaint  Patient presents with  . discuss possible ovarian cyst    ml  . left abdominal pain    HPI 59 year old G2 P2 presents today for left lower abdominal pain and bloating that has been ongoing for a few months but is worsening.  Describes the pain as dull, constant, and worse when lying on left side.  She feels relief when she pushes on abdomen.  2010 endometrial ablation, polypectomy, and cystectomy of benign paratubal cyst of left ovary. Says the pain she is experiencing now is similar to the pain she had at that time.  Perimenopausal, LMP 04/2019 with cycles every 4 to 6 months. On HRT- Estradiol 0.05 mg patch and Prometrium 200 mg.  Sonohysterogram 04/28/2016 for irregular menstrual cycles, endometrial biopsy obtained and negative for malignancy. Denies urinary, vaginal or GI symptoms.    Review of Systems  Constitutional: Negative.   Gastrointestinal: Positive for abdominal pain (left lower). Negative for abdominal distention, constipation, diarrhea, nausea and vomiting.  Genitourinary: Positive for pelvic pain (left ). Negative for dysuria, flank pain, frequency, menstrual problem, urgency, vaginal bleeding and vaginal discharge.       Objective:    Physical Exam Constitutional:      Appearance: Normal appearance.  Abdominal:     General: Abdomen is flat. Bowel sounds are normal. There is no distension.     Palpations: Abdomen is soft. There is no mass.     Tenderness: There is no abdominal tenderness. There is no right CVA tenderness, left CVA tenderness, guarding or rebound.  Genitourinary:    General: Normal vulva.     Vagina: Normal.     Cervix: Normal.     Uterus: Normal.      BP 136/80 (BP Location: Right Arm, Patient Position: Sitting, Cuff Size: Normal)   LMP 06/09/2018 (Within Weeks)  Wt Readings from Last 3 Encounters:    06/15/19 143 lb 1.6 oz (64.9 kg)  06/15/19 142 lb (64.4 kg)  03/07/19 140 lb (63.5 kg)        Assessment & Plan:   Problem List Items Addressed This Visit      Other   Perimenopause    Other Visit Diagnoses    Left lower quadrant abdominal pain    -  Primary   Relevant Orders   US PELVIS TRANSVAGINAL NON-OB (TV ONLY)   History of removal of ovarian cyst       Relevant Orders   US PELVIS TRANSVAGINAL NON-OB (TV ONLY)   Abdominal bloating       Relevant Orders   US PELVIS TRANSVAGINAL NON-OB (TV ONLY)     Plan: Will schedule pelvic ultrasound. Recommend Ibuprofen as needed for pain.  Instructed to return to office or the emergency room if she experiences severe pain.  Patient is agreeable to plan.     Tamela [facility], 11:34 AM 08/17/2019

## 2019-08-22 ENCOUNTER — Other Ambulatory Visit: Payer: Self-pay

## 2019-08-23 ENCOUNTER — Encounter: Payer: Self-pay | Admitting: Nurse Practitioner

## 2019-08-23 ENCOUNTER — Ambulatory Visit: Payer: BC Managed Care – PPO | Admitting: Nurse Practitioner

## 2019-08-23 ENCOUNTER — Ambulatory Visit (INDEPENDENT_AMBULATORY_CARE_PROVIDER_SITE_OTHER): Payer: BC Managed Care – PPO

## 2019-08-23 VITALS — BP 134/80

## 2019-08-23 DIAGNOSIS — N800 Endometriosis of uterus: Secondary | ICD-10-CM

## 2019-08-23 DIAGNOSIS — Z9889 Other specified postprocedural states: Secondary | ICD-10-CM

## 2019-08-23 DIAGNOSIS — R140 Abdominal distension (gaseous): Secondary | ICD-10-CM | POA: Diagnosis not present

## 2019-08-23 DIAGNOSIS — N854 Malposition of uterus: Secondary | ICD-10-CM

## 2019-08-23 DIAGNOSIS — R1032 Left lower quadrant pain: Secondary | ICD-10-CM

## 2019-08-23 DIAGNOSIS — Z8742 Personal history of other diseases of the female genital tract: Secondary | ICD-10-CM

## 2019-08-23 DIAGNOSIS — N8003 Adenomyosis of the uterus: Secondary | ICD-10-CM

## 2019-08-23 NOTE — Progress Notes (Signed)
History: 59 year old presents today for ultrasound follow-up.  Was seen by me 08/17/2019 for left lower abdominal pain and bloating that has been ongoing for a few months but has worsened.  Describes the pain as dull, constant, and worse when lying on left side.  She feels relief when she pushes on abdomen.  2010 endometrial ablation, polypectomy, and cystectomy of benign paratubal cyst of left ovary.  Perimenopausal, LMP 04/2019 with cycles every 4 to 6 months.  On HRT -estradiol 0.05 mg patch and Prometrium 200 mg. Denies changes in bowel movements.   Exam: Appears well. Ultrasound: Anteverted uterus, normal size and shape, no myometrial masses, myometrium with streaking and shadowing, consistent with adenomyosis.  Thin, symmetrical endometrium, no masses or thickening seen.  Both ovaries appear atrophic.  No adnexal masses, no free fluid.  Assessment: Adenomyosis Left lower quadrant abdominal pain  Plan: Discussed ultrasound and reassurance provided on normal findings.  Most likely GI related.  Is established with Dr. Collene Mares and will call his office to schedule appointment.  Patient is agreeable to plan.

## 2019-09-06 ENCOUNTER — Encounter: Payer: Self-pay | Admitting: Orthopaedic Surgery

## 2019-09-06 ENCOUNTER — Ambulatory Visit: Payer: Self-pay

## 2019-09-06 ENCOUNTER — Ambulatory Visit: Payer: BC Managed Care – PPO | Admitting: Orthopaedic Surgery

## 2019-09-06 VITALS — Ht 62.0 in | Wt 143.0 lb

## 2019-09-06 DIAGNOSIS — M7541 Impingement syndrome of right shoulder: Secondary | ICD-10-CM | POA: Diagnosis not present

## 2019-09-06 DIAGNOSIS — M25511 Pain in right shoulder: Secondary | ICD-10-CM

## 2019-09-06 DIAGNOSIS — G8929 Other chronic pain: Secondary | ICD-10-CM

## 2019-09-06 NOTE — Progress Notes (Signed)
Office Visit Note   Patient: Kristin Whitney           Date of Birth: 09/07/1960           MRN: [id number] Visit Date: 09/06/2019              Requested by: Susy Frizzle, MD [address] [place],  [place] [id number] PCP: Susy Frizzle, MD   Assessment & Plan: Visit Diagnoses:  1. Chronic right shoulder pain     Plan: Patient has some impingement right shoulder subacromial injection performed with improvement in pain.  She will work on gentle stretching.  We will check her back again in a few weeks.  She will do some finger wall walking use a rope or pulley.  Follow-Up Instructions: No follow-ups on file.   Orders:  Orders Placed This Encounter  Procedures  . XR Shoulder Right   No orders of the defined types were placed in this encounter.     Procedures: Large Joint Inj: R subacromial bursa on 09/06/2019 4:27 PM Indications: pain Details: 22 G 1.5 in needle  Arthrogram: No  Medications: 4 mL bupivacaine 0.25 %; 40 mg methylPREDNISolone acetate 40 MG/ML; 0.5 mL lidocaine 1 % Outcome: tolerated well, no immediate complications Procedure, treatment alternatives, risks and benefits explained, specific risks discussed. Consent was given by the patient. Immediately prior to procedure a time out was called to verify the correct patient, procedure, equipment, support staff and site/side marked as required. Patient was prepped and draped in the usual sterile fashion.       Clinical Data: No additional findings.   Subjective: Chief Complaint  Patient presents with  . Right Shoulder - Pain    HPI patient returns post July 2020 proximal phalanx fracture pinning fifth finger.  She states she can make a fist occasionally has a little bit aching but has had problems with her shoulder she thinks it might be related to the block that she had for the surgical procedure.  She noticed some stiffness limited getting her arm up overhead some pain with outstretch  reaching.  No history of injury or falls.  She is used ibuprofen rarely.  She has a possible she may have injured her shoulder at the same time she fell and injured her finger.  Review of Systems updated unchanged since surgery last year.  All systems reviewed.   Objective: Vital Signs: Ht 5\' 2"  (1.575 m)   Wt 143 lb (64.9 kg)   LMP 06/09/2018 (Within Weeks)   BMI 26.16 kg/m   Physical Exam Constitutional:      Appearance: She is well-developed.  HENT:     Head: Normocephalic.     Right Ear: External ear normal.     Left Ear: External ear normal.  Eyes:     Pupils: Pupils are equal, round, and reactive to light.  Neck:     Thyroid: No thyromegaly.     Trachea: No tracheal deviation.  Cardiovascular:     Rate and Rhythm: Normal rate.  Pulmonary:     Effort: Pulmonary effort is normal.  Abdominal:     Palpations: Abdomen is soft.  Skin:    General: Skin is warm and dry.  Neurological:     Mental Status: She is alert and oriented to person, place, and time.  Psychiatric:        Behavior: Behavior normal.     Ortho Exam no brachial plexus tenderness negative Spurling.  Patient has  positive impingement some pain with supraspinatus isolated testing.  Long head of the biceps is normal.  No subscap weakness.  Negative liftoff test.  Opposite left shoulder has full range of motion no impingement.  Patient lacks 10 degrees of flexion and abduction right shoulder compared to the opposite normal left shoulder.  Specialty Comments:  No specialty comments available.  Imaging: No results found.   PMFS History: Patient Active Problem List   Diagnosis Date Noted  . Finger fracture, right 09/28/2018  . Gastritis   . Neck pain 08/14/2016  . Tonsillar calculus 08/14/2016  . 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 Irregular menstrual cycle 04/28/2016  . History of vitamin D deficiency 12/05/2014  . Perimenopause 12/05/2014  . History of anemia 06/07/2012  . Tiredness  06/07/2012  . Menstrual migraine 06/02/2011   Past Medical History:  Diagnosis Date  . ADHD (attention deficit hyperactivity disorder)   . Anemia   . Atypical mole 07/27/2012   moderate right tricep  . Atypical mole 07/27/2012   moderate left outer thigh  . Atypical mole 07/27/2012   moderate right shouder  . Depression   . Fibrocystic breast changes    left  . Gastritis    egd- 2018 Dr. Collene Mares  . OSA on CPAP     Family History  Problem Relation Age of Onset  . Hyperlipidemia Mother   . Alzheimer's disease Mother   . Breast cancer Maternal Aunt   . Breast cancer Maternal Aunt   . Stroke Father   . Alcohol abuse Father   . Breast cancer Cousin     Past Surgical History:  Procedure Laterality Date  . BREAST BIOPSY Left 2012  . FINGER SURGERY  2020  . GYNECOLOGIC CRYOSURGERY    . NASAL SEPTUM SURGERY  1994  . PELVIC LAPAROSCOPY  12/09/2008   PELVIC WASHINGS, EXCISION OF TORSED LEFT PARATUBULAR CYST. ROLLERBALL ENDOMETRIAL ABLATION   Social History   Occupational History  . Not on file  Tobacco Use  . Smoking status: Never Smoker  . Smokeless tobacco: Never Used  Vaping Use  . Vaping Use: Never used  Substance and Sexual Activity  . Alcohol use: Yes    Alcohol/week: 0.0 standard drinks    Comment: WINE - rare  . Drug use: No  . Sexual activity: Yes    Birth control/protection: None, Other-see comments    Comment: PATIENT'S HUSBAND WITH VASECTOMY

## 2019-09-07 MED ORDER — LIDOCAINE HCL 1 % IJ SOLN
0.5000 mL | INTRAMUSCULAR | Status: AC | PRN
  Administered 2019-09-06: .5 mL

## 2019-09-07 MED ORDER — BUPIVACAINE HCL 0.25 % IJ SOLN
4.0000 mL | INTRAMUSCULAR | Status: AC | PRN
  Administered 2019-09-06: 4 mL via INTRA_ARTICULAR

## 2019-09-07 MED ORDER — METHYLPREDNISOLONE ACETATE 40 MG/ML IJ SUSP
40.0000 mg | INTRAMUSCULAR | Status: AC | PRN
Start: 2019-09-06 — End: 2019-09-06
  Administered 2019-09-06: 40 mg via INTRA_ARTICULAR

## 2019-09-08 ENCOUNTER — Ambulatory Visit: Payer: BC Managed Care – PPO | Admitting: Nurse Practitioner

## 2019-09-08 ENCOUNTER — Other Ambulatory Visit: Payer: Self-pay

## 2019-09-08 VITALS — BP 124/88 | HR 74 | Temp 97.9°F | Resp 18 | Wt 147.8 lb

## 2019-09-08 DIAGNOSIS — S40022A Contusion of left upper arm, initial encounter: Secondary | ICD-10-CM

## 2019-09-08 NOTE — Progress Notes (Signed)
Established Patient Office Visit  Subjective:  Patient ID: Kristin Whitney, female    DOB: 14-Sep-1960  Age: 59 y.o. MRN: [id number]  CC:  Chief Complaint  Patient presents with  . Cyst    bruise with a knot on back of L arm, noticed it on 06/27, pt does not remember hitting it    HPI Kristin Whitney is a 59 year old female presenting to the clinic with a bruise and small knot in the center of the bruise located on her outer upper mid left arm. She noticed the area 1 day ago. She does not recall an injury or insect bite. Her family noticed the bruise, she felt the area and noticed the center lump. She has noticed no drainage, foul odor, erythema, swelling, or odor. She has had no fever or chills. No other sxs.   Past Medical History:  Diagnosis Date  . ADHD (attention deficit hyperactivity disorder)   . Anemia   . Atypical mole 07/27/2012   moderate right tricep  . Atypical mole 07/27/2012   moderate left outer thigh  . Atypical mole 07/27/2012   moderate right shouder  . Depression   . Fibrocystic breast changes    left  . Gastritis    egd- 2018 Dr. Collene Mares  . OSA on CPAP     Past Surgical History:  Procedure Laterality Date  . BREAST BIOPSY Left 2012  . FINGER SURGERY  2020  . GYNECOLOGIC CRYOSURGERY    . NASAL SEPTUM SURGERY  1994  . PELVIC LAPAROSCOPY  12/09/2008   PELVIC WASHINGS, EXCISION OF TORSED LEFT PARATUBULAR CYST. ROLLERBALL ENDOMETRIAL ABLATION    Family History  Problem Relation Age of Onset  . Hyperlipidemia Mother   . Alzheimer's disease Mother   . Breast cancer Maternal Aunt   . Breast cancer Maternal Aunt   . Stroke Father   . Alcohol abuse Father   . Breast cancer Cousin     Social History   Socioeconomic History  . Marital status: Married    Spouse name: Not on file  . Number of children: Not on file  . Years of education: Not on file  . Highest education level: Not on file  Occupational History  . Not on file  Tobacco Use   . Smoking status: Never Smoker  . Smokeless tobacco: Never Used  Vaping Use  . Vaping Use: Never used  Substance and Sexual Activity  . Alcohol use: Yes    Alcohol/week: 0.0 standard drinks    Comment: WINE - rare  . Drug use: No  . Sexual activity: Yes    Birth control/protection: None, Other-see comments    Comment: PATIENT'S HUSBAND WITH VASECTOMY  Other Topics Concern  . Not on file  Social History Narrative  . Not on file   Social Determinants of Health   Financial Resource Strain:   . Difficulty of Paying Living Expenses:   Food Insecurity:   . Worried About Charity fundraiser in the Last Year:   . Arboriculturist in the Last Year:   Transportation Needs:   . Film/video editor (Medical):   Marland Kitchen Lack of Transportation (Non-Medical):   Physical Activity:   . Days of Exercise per Week:   . Minutes of Exercise per Session:   Stress:   . Feeling of Stress :   Social Connections:   . Frequency of Communication with Friends and Family:   . Frequency of Social Gatherings with Friends and  Family:   . Attends Religious Services:   . Active Member of Clubs or Organizations:   . Attends Archivist Meetings:   Marland Kitchen Marital Status:   Intimate Partner Violence:   . Fear of Current or Ex-Partner:   . Emotionally Abused:   Marland Kitchen Physically Abused:   . Sexually Abused:     Outpatient Medications Prior to Visit  Medication Sig Dispense Refill  . albuterol (PROVENTIL HFA;VENTOLIN HFA) 108 (90 Base) MCG/ACT inhaler TAKE 2 PUFFS INTO LUNGS EVERY 6 HOURS AS NEEDED FOR WHEEZE OR SHORTNESS OF BREATH 8.5 Inhaler 0  . Biotin 5000 MCG CAPS Take by mouth.    . Cholecalciferol 100 MCG (4000 UT) CAPS Take by mouth.    . COLLAGEN PO Take by mouth.    . diazepam (VALIUM) 10 MG tablet Take 1 tablet (10 mg total) by mouth at bedtime as needed for anxiety or sleep. 30 tablet 1  . estradiol (VIVELLE-DOT) 0.05 MG/24HR patch Place 1 patch (0.05 mg total) onto the skin 2 (two) times a  week. 24 patch 4  . Garlic Oil 093 MG TABS Take by mouth daily.    Marland Kitchen loratadine (CLARITIN) 10 MG tablet Take 10 mg by mouth daily.    . Medium Chain Triglycerides (MCT OIL PO) Take by mouth.    . Multiple Vitamin (MULTIVITAMIN) tablet Take 1 tablet by mouth daily.    . mupirocin cream (BACTROBAN) 2 % Apply 1 application topically 2 (two) times daily. 15 g 3  . Probiotic Product (PROBIOTIC DAILY PO) Take by mouth.    . progesterone (PROMETRIUM) 200 MG capsule Take 1 capsule (200 mg total) by mouth daily. First 12 days of the month at bedtime 36 capsule 4  . Safflower Oil (CLA) 1000 MG CAPS Take by mouth. TEIM- CLA AND COLLAGEN     No facility-administered medications prior to visit.    Allergies  Allergen Reactions  . Clarithromycin   . Codeine Nausea And Vomiting    ROS Review of Systems  All other systems reviewed and are negative.     Objective:    Physical Exam Vitals reviewed.  Constitutional:      Appearance: Normal appearance. She is not ill-appearing or diaphoretic.  HENT:     Head: Normocephalic.  Eyes:     General: No scleral icterus.    Extraocular Movements: Extraocular movements intact.     Conjunctiva/sclera: Conjunctivae normal.     Pupils: Pupils are equal, round, and reactive to light.  Cardiovascular:     Rate and Rhythm: Normal rate.  Pulmonary:     Effort: Pulmonary effort is normal.  Musculoskeletal:     Cervical back: Normal range of motion and neck supple.  Skin:    General: Skin is warm and dry.     Coloration: Skin is not jaundiced or pale.     Findings: No rash.          Comments: Approxi. 7cm round bruise with 1 cm solid lump in center, less than 0.25cm red circular size center, no opening, drainage, odor, eryhtema, edema present  Neurological:     General: No focal deficit present.     Mental Status: She is alert and oriented to person, place, and time.  Psychiatric:        Mood and Affect: Mood normal.        Behavior: Behavior  normal.        Thought Content: Thought content normal.  Judgment: Judgment normal.     BP 124/88 (BP Location: Right Arm, Patient Position: Sitting, Cuff Size: Normal)   Pulse 74   Temp 97.9 F (36.6 C) (Temporal)   Resp 18   Wt 147 lb 12.8 oz (67 kg)   LMP 06/09/2018 (Within Weeks)   SpO2 97%   BMI 27.03 kg/m  Wt Readings from Last 3 Encounters:  09/08/19 147 lb 12.8 oz (67 kg)  09/06/19 143 lb (64.9 kg)  06/15/19 143 lb 1.6 oz (64.9 kg)     Health Maintenance Due  Topic Date Due  . COVID-19 Vaccine (1) Never done    There are no preventive care reminders to display for this patient.  Lab Results  Component Value Date   TSH 1.57 12/11/2017   Lab Results  Component Value Date   WBC 7.3 01/20/2019   HGB 13.8 01/20/2019   HCT 43.4 01/20/2019   MCV 88.2 01/20/2019   PLT 271 01/20/2019   Lab Results  Component Value Date   NA 140 01/20/2019   K 4.3 01/20/2019   CO2 25 01/20/2019   GLUCOSE 92 01/20/2019   BUN 14 01/20/2019   CREATININE 0.64 01/20/2019   BILITOT 0.5 01/20/2019   ALKPHOS 70 11/16/2015   AST 15 01/20/2019   ALT 14 01/20/2019   PROT 7.3 01/20/2019   ALBUMIN 4.1 11/16/2015   CALCIUM 9.9 01/20/2019   Lab Results  Component Value Date   CHOL 212 (H) 01/20/2019   Lab Results  Component Value Date   HDL 76 01/20/2019   Lab Results  Component Value Date   LDLCALC 115 (H) 01/20/2019   Lab Results  Component Value Date   TRIG 99 01/20/2019   Lab Results  Component Value Date   CHOLHDL 2.8 01/20/2019   Lab Results  Component Value Date   HGBA1C 5.5 06/07/2012      Assessment & Plan:   Problem List Items Addressed This Visit    None    Visit Diagnoses    Hematoma of arm, left, initial encounter    -  Primary     It is unclear how you came to have a bruise to your arm however it is possible that you have been bitten by an insect that has caused the center lump. You may take a daily zyrtec and use topical cortisone cream  for a 3 days to see if the area improves. Take daily pictures. If sxs worsen or fail to improve seek medical attention.  Follow-up: Return if symptoms worsen or fail to improve.    Annie Main, FNP

## 2019-09-28 NOTE — Progress Notes (Signed)
   Follow-Up Visit   Subjective  Kristin Whitney is a 59 y.o. female who presents for the following: Annual Exam (right nare bump for 1 month, ).   The following portions of the chart were reviewed this encounter and updated as appropriate: Tobacco  Allergies  Meds  Problems  Med Hx  Surg Hx  Fam Hx      Objective  Well appearing patient in no apparent distress; mood and affect are within normal limits.  A full examination was performed including scalp, head, eyes, ears, nose, lips, neck, chest, axillae, abdomen, back, buttocks, bilateral upper extremities, bilateral lower extremities, hands, feet, fingers, toes, fingernails, and toenails. All findings within normal limits unless otherwise noted below.  Objective  Left Labium Majus (2), Right Genitocrural Fold, Right Labium Majus: White cyst  Objective  Right intranasal: papule  Objective  head to toe: No DN, no signs of nmsc   Assessment & Plan  Milia (4) Right Genitocrural Fold; Left Labium Majus (2); Right Labium Majus    Anaerobic and Aerobic Culture - Left Labium Majus (2), Right Genitocrural Fold, Right Labium Majus  Acne/Milia surgery - Left Labium Majus (2), Right Genitocrural Fold, Right Labium Majus   Informed Consent: Discussed risks (permanent scarring, light or dark discoloration, infection, pain, bleeding, bruising, redness, damage to adjacent structures, and recurrence of the lesion) and benefits of the procedure, as well as the alternatives.  Informed consent was obtained.  Preparation: The area was prepped with alcohol.  Anesthesia: Lidocaine 1% with epinephrine  Procedure Details: An incision was made overlying the lesion. The lesion drained pus.  A small amount of fluid was drained.    Antibiotic ointment and a sterile pressure dressing were applied. The patient tolerated procedure well.  Total number of lesions drained: 4  Plan: The patient was instructed on post-op care. Recommend  OTC analgesia as needed for pain.   Polyp in nasopharynx Right intranasal  Mupirocin ointment  Ordered Medications: mupirocin cream (BACTROBAN) 2 %  Screening exam for skin cancer head to toe  Yearly skin checks    I, SHEFFIELD,KELLI, PA-C, have reviewed all documentation's for this visit.  The documentation on 09/28/19 for the exam, diagnosis, procedures and orders are all accurate and complete.

## 2019-09-28 NOTE — Addendum Note (Signed)
Addended by: Robyne Askew R on: 09/28/2019 11:01 AM   Modules accepted: Orders

## 2019-11-23 ENCOUNTER — Telehealth: Payer: Self-pay | Admitting: Family Medicine

## 2019-11-23 NOTE — Telephone Encounter (Signed)
CB# [phone]  Husband was testing for covid Monday 11-21-19 which was postive what should she do ? Wasn't sure if she should be tested

## 2019-11-24 NOTE — Telephone Encounter (Signed)
Lvm on pt's phone of plan.

## 2019-11-28 ENCOUNTER — Encounter: Payer: Self-pay | Admitting: Family Medicine

## 2019-11-28 NOTE — Telephone Encounter (Signed)
Husband positive for covid she has tested positive for covid on this past Sat [id number] please advise

## 2019-11-28 NOTE — Telephone Encounter (Signed)
Pt calling back concerning covid testing

## 2019-11-29 ENCOUNTER — Other Ambulatory Visit: Payer: Self-pay | Admitting: Family Medicine

## 2019-12-01 ENCOUNTER — Other Ambulatory Visit: Payer: Self-pay

## 2019-12-01 ENCOUNTER — Telehealth: Payer: Self-pay

## 2019-12-01 ENCOUNTER — Ambulatory Visit
Admission: EM | Admit: 2019-12-01 | Discharge: 2019-12-01 | Disposition: A | Discharge status: Home / Self Care | Payer: BC Managed Care – PPO

## 2019-12-01 ENCOUNTER — Ambulatory Visit (HOSPITAL_COMMUNITY)
Admission: RE | Admit: 2019-12-01 | Discharge: 2019-12-01 | Disposition: A | Discharge status: Home / Self Care | Payer: BC Managed Care – PPO | Source: Ambulatory Visit | Attending: Pulmonary Disease | Admitting: Pulmonary Disease

## 2019-12-01 ENCOUNTER — Other Ambulatory Visit: Payer: Self-pay | Admitting: Nurse Practitioner

## 2019-12-01 DIAGNOSIS — U071 COVID-19: Secondary | ICD-10-CM | POA: Diagnosis present

## 2019-12-01 MED ORDER — SODIUM CHLORIDE 0.9 % IV SOLN
1200.0000 mg | Freq: Once | INTRAVENOUS | Status: AC
  Administered 2019-12-01: 1200 mg via INTRAVENOUS

## 2019-12-01 MED ORDER — METHYLPREDNISOLONE SODIUM SUCC 125 MG IJ SOLR: 125.0000 mg | Freq: Once | INTRAMUSCULAR | Status: DC | PRN

## 2019-12-01 MED ORDER — FAMOTIDINE IN NACL 20-0.9 MG/50ML-% IV SOLN: 20.0000 mg | Freq: Once | INTRAVENOUS | Status: DC | PRN

## 2019-12-01 MED ORDER — DIPHENHYDRAMINE HCL 50 MG/ML IJ SOLN: 50.0000 mg | Freq: Once | INTRAMUSCULAR | Status: DC | PRN

## 2019-12-01 MED ORDER — SODIUM CHLORIDE 0.9 % IV SOLN: INTRAVENOUS | Status: DC | PRN

## 2019-12-01 MED ORDER — ALBUTEROL SULFATE HFA 108 (90 BASE) MCG/ACT IN AERS: 2.0000 | INHALATION_SPRAY | Freq: Once | RESPIRATORY_TRACT | Status: DC | PRN

## 2019-12-01 MED ORDER — EPINEPHRINE 0.3 MG/0.3ML IJ SOAJ: 0.3000 mg | Freq: Once | INTRAMUSCULAR | Status: DC | PRN

## 2019-12-01 NOTE — Telephone Encounter (Signed)
Notified infusion clinic and pt has an appt today 09/23

## 2019-12-01 NOTE — Telephone Encounter (Signed)
I am fine referring her to the infusion clinic.

## 2019-12-01 NOTE — ED Notes (Signed)
Provider made aware, pt has no specific complaint other than possibly wanting infusion therapy, which we don't provide. Number to infusion and covid clinic

## 2019-12-01 NOTE — Telephone Encounter (Signed)
Pt came to parking lot hoping to be seen. Pt tested positive for covid and states she is really sick and wants to know if she will benefit from the infusion? I advised pt to go to UC if her symptoms were worsening. Please advise.

## 2019-12-01 NOTE — Progress Notes (Signed)
I connected by phone with Kristin Whitney on 12/01/2019 at 1:10 PM to discuss the potential use of a new treatment for mild to moderate COVID-19 viral infection in non-hospitalized patients.  This patient is a 59 y.o. female that meets the FDA criteria for Emergency Use Authorization of COVID monoclonal antibody casirivimab/imdevimab.  Has a (+) direct SARS-CoV-2 viral test result  Has mild or moderate COVID-19   Is NOT hospitalized due to COVID-19  Is within 10 days of symptom onset  Has at least one of the high risk factor(s) for progression to severe COVID-19 and/or hospitalization as defined in EUA.  Specific high risk criteria : BMI > 25   I have spoken and communicated the following to the patient or parent/caregiver regarding COVID monoclonal antibody treatment:  1. FDA has authorized the emergency use for the treatment of mild to moderate COVID-19 in adults and pediatric patients with positive results of direct SARS-CoV-2 viral testing who are 68 years of age and older weighing at least 40 kg, and who are at high risk for progressing to severe COVID-19 and/or hospitalization.  2. The significant known and potential risks and benefits of COVID monoclonal antibody, and the extent to which such potential risks and benefits are unknown.  3. Information on available alternative treatments and the risks and benefits of those alternatives, including clinical trials.  4. Patients treated with COVID monoclonal antibody should continue to self-isolate and use infection control measures (e.g., wear mask, isolate, social distance, avoid sharing personal items, clean and disinfect "high touch" surfaces, and frequent handwashing) according to CDC guidelines.   5. The patient or parent/caregiver has the option to accept or refuse COVID monoclonal antibody treatment.  After reviewing this information with the patient, The patient agreed to proceed with receiving casirivimab\imdevimab  infusion and will be provided a copy of the Fact sheet prior to receiving the infusion. [name] 12/01/2019 1:10 PM

## 2019-12-01 NOTE — Progress Notes (Signed)
  Diagnosis: COVID-19  Physician: Dr. Joya Gaskins  Procedure: Covid Infusion Clinic Med: casirivimab\imdevimab infusion - Provided patient with casirivimab\imdevimab fact sheet for patients, parents and caregivers prior to infusion.  Complications: No immediate complications noted.  Discharge: Discharged home   Tia Masker 12/01/2019

## 2019-12-01 NOTE — Discharge Instructions (Signed)

## 2019-12-01 NOTE — ED Triage Notes (Signed)
Pt tested positive on 9/16 ,and is feeling worse, no respiratory distress.

## 2019-12-02 ENCOUNTER — Telehealth: Payer: Self-pay

## 2019-12-02 MED ORDER — ESCITALOPRAM OXALATE 10 MG PO TABS: 10.0000 mg | ORAL_TABLET | Freq: Every day | ORAL | 3 refills | Status: AC

## 2019-12-02 NOTE — Telephone Encounter (Signed)
Pt would like MAB infusion?

## 2019-12-02 NOTE — Telephone Encounter (Signed)
She can start lexapro 10 mg poqday.  The medicine takes time to help so in the meantime she can use xanax as needed.  If she is scared for her safety, she will need to go to the er.

## 2019-12-05 ENCOUNTER — Telehealth: Payer: Self-pay | Admitting: Family Medicine

## 2019-12-05 DIAGNOSIS — F329 Major depressive disorder, single episode, unspecified: Secondary | ICD-10-CM

## 2019-12-05 NOTE — Telephone Encounter (Signed)
Have routed message to husband provider

## 2019-12-05 NOTE — Telephone Encounter (Signed)
CB# [phone] Pt need a call back about a referral to a psychiatrist. Pt states that no one calling her back she about to lose her mind please

## 2019-12-06 ENCOUNTER — Ambulatory Visit (HOSPITAL_COMMUNITY)
Admission: EM | Admit: 2019-12-06 | Discharge: 2019-12-06 | Disposition: A | Discharge status: Home / Self Care | Payer: BC Managed Care – PPO | Attending: Psychiatry | Admitting: Psychiatry

## 2019-12-06 ENCOUNTER — Other Ambulatory Visit: Payer: Self-pay

## 2019-12-06 DIAGNOSIS — F4323 Adjustment disorder with mixed anxiety and depressed mood: Secondary | ICD-10-CM

## 2019-12-06 DIAGNOSIS — F411 Generalized anxiety disorder: Secondary | ICD-10-CM | POA: Diagnosis not present

## 2019-12-06 DIAGNOSIS — F410 Panic disorder [episodic paroxysmal anxiety] without agoraphobia: Secondary | ICD-10-CM | POA: Diagnosis not present

## 2019-12-06 MED ORDER — QUETIAPINE FUMARATE 200 MG PO TABS: 200.0000 mg | ORAL_TABLET | Freq: Every day | ORAL | 0 refills | Status: DC

## 2019-12-06 MED ORDER — QUETIAPINE FUMARATE 25 MG PO TABS: 25.0000 mg | ORAL_TABLET | Freq: Two times a day (BID) | ORAL | 0 refills | Status: DC

## 2019-12-06 NOTE — ED Notes (Signed)
Patient discharged home. AVS reviewed with patient and she verbalized understanding and written copy given to patient. Patient left with family.

## 2019-12-06 NOTE — BH Assessment (Addendum)
Comprehensive Clinical Assessment (CCA) Note  12/06/2019 Kristin Whitney [id number]   Patient is a 59 year old female presenting voluntarily to [facility] for assessment. She is accompanied by her husband, Elta Guadeloupe, who is present for assessment and provides collateral information. Patient reports she was diagnosed with Covid 2 weeks ago, triggering her anxiety. Patient states she completed her 10 day quarantine but continues to feel anxious. She denies SI/HI/AVH. Patient endorses excessive worry, poor sleep, poor appetite, poor concentration, and irritability. She is prescribed Xanax, Valium, and Lexapro by her PCP but that still is not helping. She is not linked with any outpatient psychiatric services.   Visit Diagnosis:   Adjustment disorder, with anxiety  Patient does not meet in patient care criteria and is psych cleared   CCA Screening, Triage and Referral (STR)   Patient did not complete STR.  CCA Biopsychosocial  Intake/Chief Complaint:  CCA Intake With Chief Complaint CCA Part Two Date: 12/06/19 CCA Part Two Time: 14 Chief Complaint/Presenting Problem: NA Patient's Currently Reported Symptoms/Problems: NA Individual's Strengths: NA Individual's Preferences: NA Individual's Abilities: NA Type of Services Patient Feels Are Needed: NA Initial Clinical Notes/Concerns: NA  Mental Health Symptoms Depression:  Depression: Difficulty Concentrating, Fatigue, Hopelessness, Increase/decrease in appetite, Irritability, Sleep (too much or little), Duration of symptoms less than two weeks  Mania:  Mania: None  Anxiety:   Anxiety: Worrying, Tension, Sleep, Restlessness, Irritability, Fatigue, Difficulty concentrating  Psychosis:  Psychosis: None  Trauma:  Trauma: None  Obsessions:  Obsessions: None  Compulsions:  Compulsions: None  Inattention:  Inattention: None  Hyperactivity/Impulsivity:  Hyperactivity/Impulsivity: N/A  Oppositional/Defiant Behaviors:  Oppositional/Defiant  Behaviors: N/A  Emotional Irregularity:  Emotional Irregularity: N/A  Other Mood/Personality Symptoms:      Mental Status Exam Appearance and self-care  Stature:  Stature: Average  Weight:  Weight: Average weight  Clothing:  Clothing: Neat/clean  Grooming:  Grooming: Normal  Cosmetic use:  Cosmetic Use: None  Posture/gait:  Posture/Gait: Tense  Motor activity:  Motor Activity: Restless, Repetitive  Sensorium  Attention:  Attention: Normal  Concentration:  Concentration: Anxiety interferes  Orientation:  Orientation: X5  Recall/memory:  Recall/Memory: Normal  Affect and Mood  Affect:  Affect: Anxious  Mood:  Mood: Anxious  Relating  Eye contact:  Eye Contact: Fleeting  Facial expression:  Facial Expression: Anxious  Attitude toward examiner:  Attitude Toward Examiner: Cooperative  Thought and Language  Speech flow: Speech Flow: Clear and Coherent  Thought content:  Thought Content: Appropriate to Mood and Circumstances  Preoccupation:  Preoccupations: None  Hallucinations:  Hallucinations: None  Organization:     Transport planner of Knowledge:  Fund of Knowledge: Fair  Intelligence:  Intelligence: Average  Abstraction:  Abstraction: Normal  Judgement:  Judgement: Impaired  Reality Testing:  Reality Testing: Distorted  Insight:  Insight: Flashes of insight  Decision Making:  Decision Making: Normal  Social Functioning  Social Maturity:  Social Maturity: Isolates  Social Judgement:  Social Judgement: Normal  Stress  Stressors:  Stressors: Illness  Coping Ability:  Coping Ability: Research officer, political party Deficits:  Skill Deficits: Environmental health practitioner, Self-care  Supports:  Supports: Family, Friends/Service system     Religion: Religion/Spirituality Are You A Religious Person?: No  Leisure/Recreation: Leisure / Recreation Do You Have Hobbies?: No  Exercise/Diet: Exercise/Diet Do You Exercise?: No Have You Gained or Lost A Significant Amount of Weight in the Past  Six Months?: No Do You Follow a Special Diet?: No Do You Have Any Trouble Sleeping?: Yes Explanation  of Sleeping Difficulties: reports sleeping 3 hours per night   CCA Employment/Education  Employment/Work Situation: Employment / Work Situation Employment situation: Employed Where is patient currently employed?: [employer] long has patient been employed?: [employer] Patient's job has been impacted by current illness: Yes Describe how patient's job has been impacted: patient unable to work due to extreme anxiety What is the longest time patient has a held a job?: NA Where was the patient employed at that time?: NA Has patient ever been in the [employer]?: No  Education: Education Is Patient Currently Attending School?: No Last Grade Completed: 12 Did Teacher, adult education From [employer]?: Yes Did Physicist, medical?: Yes Did [employer]?: No Did You Have An Individualized Education Program (IIEP): No Did You Have Any Difficulty At [employer]?: No Patient's Education Has Been Impacted by Current Illness: No   CCA Family/Childhood History  Family and Relationship History: Family history Marital status: Married Number of Years Married:  (NA) What types of issues is patient dealing with in the relationship?: none Additional relationship information: none Are you sexually active?: Yes What is your sexual orientation?: heterosexual Does patient have children?: Yes How many children?: 2 How is patient's relationship with their children?: both grown  Childhood History:  Childhood History By whom was/is the patient raised?: Both parents Additional childhood history information: NA Description of patient's relationship with caregiver when they were a child: supportive Patient's description of current relationship with people who raised him/her: deceased How were you disciplined when you got in trouble as a child/adolescent?: NA Does patient have siblings?: No Did  patient suffer any verbal/emotional/physical/sexual abuse as a child?: No Did patient suffer from severe childhood neglect?: No Has patient ever been sexually abused/assaulted/raped as an adolescent or adult?: No Was the patient ever a victim of a crime or a disaster?: No Witnessed domestic violence?: No Has patient been affected by domestic violence as an adult?: No  Child/Adolescent Assessment:     CCA Substance Use  Alcohol/Drug Use: Alcohol / Drug Use Pain Medications: see MAR Prescriptions: see MAR Over the Counter: see MAR History of alcohol / drug use?: No history of alcohol / drug abuse                         ASAM's:  Six Dimensions of Multidimensional Assessment  Dimension 1:  Acute Intoxication and/or Withdrawal Potential:      Dimension 2:  Biomedical Conditions and Complications:      Dimension 3:  Emotional, Behavioral, or Cognitive Conditions and Complications:     Dimension 4:  Readiness to Change:     Dimension 5:  Relapse, Continued use, or Continued Problem Potential:     Dimension 6:  Recovery/Living Environment:     ASAM Severity Score:    ASAM Recommended Level of Treatment:     Substance use Disorder (SUD)    Recommendations for Services/Supports/Treatments:    DSM5 Diagnoses: Patient Active Problem List   Diagnosis Date Noted  . Impingement syndrome of right shoulder 09/06/2019  . Finger fracture, right 09/28/2018  . Gastritis   . Neck pain 08/14/2016  . Tonsillar calculus 08/14/2016  . 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 Irregular menstrual cycle 04/28/2016  . History of vitamin D deficiency 12/05/2014  . Perimenopause 12/05/2014  . History of anemia 06/07/2012  . Tiredness 06/07/2012  . Menstrual migraine 06/02/2011    Patient Centered Plan: Patient is on the following Treatment Plan(s):  Referrals to Alternative Service(s): Referred to Alternative Service(s):   Place:   Date:   Time:    Referred to  Alternative Service(s):   Place:   Date:   Time:    Referred to Alternative Service(s):   Place:   Date:   Time:    Referred to Alternative Service(s):   Place:   Date:   Time:     Orvis Brill

## 2019-12-06 NOTE — ED Provider Notes (Signed)
Behavioral Health Urgent Care Medical Screening Exam  Patient Name: Kristin Whitney MRN: [id number] Date of Evaluation: 12/06/19 Chief Complaint: Chief Complaint/Presenting Problem: NA Diagnosis:  Final diagnoses:  Generalized anxiety disorder with panic attacks  Adjustment disorder with mixed anxiety and depressed mood    History of Present illness: Kristin Whitney is a 59 y.o. female.  Patient presents voluntarily as a walk-in to the [facility] with her husband reporting increased anxiety and panic attacks.  Patient reports that she was positive for COVID on September 16 and that she was doing fine with that until she started having symptoms such as loss of smell and taste.  She states that she is normally and in control person and she feels that this is completely taken her off guard and that she has no control over anything that is going on.  She states that is because her mind to start racing and she is not been having any sleep.  Patient's husband reports that she has been sleeping approximately 3 to 4 hours a day but not always consistently.  He states that he is concerned about her because of the sleep mainly.  Patient reports that she has been prescribed Valium to help assist her with sleep at night and she takes Xanax during the day.  She also reports that they started her on Lexapro 10 mg p.o. daily but is only been on it for 2 days.  Patient's husband feels that she is safe to discharge home because she has not endorsed any suicidal or homicidal ideations and has not reported any hallucinations.  He reports that he feels that if she gets in with sleep but this will assist her greatly with her anxiety.  After discussion of medications offered to the patient Seroquel 25 mg p.o. twice daily and 200 mg p.o. nightly.  Informed them and educated them that the medication can assist with her reported symptoms of anxiety, panic attacks, poor sleep, and mood.  Recommended that she continue taking  the Lexapro with the medication.  Also informed them that if the patient continues to have extremely poor sleep and there is no improvement to return in the next 1 to 2 days.  They report no other triggers other than COVID and reports no history of suicidal ideations or attempts.  They deny having any mental health history.  Patient has continued to deny any suicidal or homicidal ideations and denied any hallucinations.  The patient demonstrates the following risk factors for suicide: Chronic risk factors for suicide include: medical illness covid. Acute risk factors for suicide include: N/A. Protective factors for this patient include: positive social support, responsibility to others (children, family) and hope for the future. Considering these factors, the overall suicide risk at this point appears to be low. Patient is appropriate for outpatient follow up.  Psychiatric Specialty Exam  Presentation  General Appearance:Casual  Eye Contact:Fair  Speech:Clear and Coherent;Normal Rate  Speech Volume:Normal  Handedness:Right   Mood and Affect  Mood:Anxious  Affect:Congruent   Thought Process  Thought Processes:Coherent  Descriptions of Associations:Intact  Orientation:Full (Time, Place and Person)  Thought Content:WDL  Hallucinations:None  Ideas of Reference:None  Suicidal Thoughts:No  Homicidal Thoughts:No   Sensorium  Memory:Immediate Good;Recent Good;Remote Good  Judgment:Fair  Insight:Fair   Executive Functions  Concentration:Good  Attention Span:Good  Recall:Good  Fund of Knowledge:Good  Language:Good   Psychomotor Activity  Psychomotor Activity:Normal   Assets  Assets:Communication Skills;Desire for Improvement;Financial Resources/Insurance;Housing;Physical Health;Social Support;Transportation;Vocational/Educational   Sleep  Sleep:Poor  Number of hours: 3   Physical Exam: Physical Exam Vitals and nursing note reviewed.   Constitutional:      Appearance: She is well-developed.  Cardiovascular:     Rate and Rhythm: Normal rate.  Pulmonary:     Effort: Pulmonary effort is normal.  Musculoskeletal:        General: Normal range of motion.  Neurological:     Mental Status: She is alert and oriented to person, place, and time.    Review of Systems  Constitutional: Negative.   HENT: Negative.   Eyes: Negative.   Respiratory: Negative.   Cardiovascular: Negative.   Gastrointestinal: Negative.   Genitourinary: Negative.   Musculoskeletal: Negative.   Skin: Negative.   Neurological: Negative.   Endo/Heme/Allergies: Negative.   Psychiatric/Behavioral: The patient is nervous/anxious.    Blood pressure 137/74, pulse 72, temperature 97.8 F (36.6 C), temperature source Temporal, resp. rate 18, height 5\' 2"  (1.575 m), weight 145 lb (65.8 kg), last menstrual period 06/09/2018, SpO2 95 %. Body mass index is 26.52 kg/m.  Musculoskeletal: Strength & Muscle Tone: within normal limits Gait & Station: normal Patient leans: N/A   [facility] Discharge Disposition for Follow up and Recommendations: Based on my evaluation the patient does not appear to have an emergency medical condition and can be discharged with resources and follow up care in outpatient services for Medication Management and [place], FNP 12/06/2019, 11:15 AM

## 2019-12-07 NOTE — Telephone Encounter (Signed)
Ok to referral?

## 2019-12-08 NOTE — Telephone Encounter (Signed)
Referral orders have been placed.   Of note, patient is currently in ED.

## 2019-12-08 NOTE — Telephone Encounter (Signed)
Ok with referral,

## 2019-12-15 ENCOUNTER — Ambulatory Visit: Payer: BC Managed Care – PPO | Admitting: Family Medicine

## 2019-12-15 ENCOUNTER — Encounter: Payer: Self-pay | Admitting: Family Medicine

## 2019-12-15 ENCOUNTER — Other Ambulatory Visit: Payer: Self-pay

## 2019-12-15 VITALS — BP 140/72 | HR 76 | Temp 98.3°F | Resp 16 | Ht 62.0 in | Wt 140.0 lb

## 2019-12-15 DIAGNOSIS — F329 Major depressive disorder, single episode, unspecified: Secondary | ICD-10-CM | POA: Diagnosis not present

## 2019-12-15 DIAGNOSIS — F411 Generalized anxiety disorder: Secondary | ICD-10-CM | POA: Diagnosis not present

## 2019-12-15 MED ORDER — PANTOPRAZOLE SODIUM 40 MG PO TBEC: 40.0000 mg | DELAYED_RELEASE_TABLET | Freq: Every day | ORAL | 3 refills | Status: DC

## 2019-12-15 NOTE — Progress Notes (Signed)
Subjective:    Patient ID: Kristin Whitney, female    DOB: 1960/12/23, 59 y.o.   MRN: [id number]  HPI  Patient recently contracted Covid.  She states that after she acquired the infection she became extremely anxious.  She states that physically she dealt with the infection fine but mentally she suffered.  She states that when she lost her sense of taste and smell she became worried about everything.  She became depressed.  She beg to be referred to a psychiatrist via electronic communication.  I recommended starting Lexapro 10 mg a day.  She has been on this for about 2 weeks.  However she went to the behavioral health urgent care and they also added Seroquel temporarily to help her "sleep".  At that time she was reporting racing thoughts, trouble sleeping.  Here today she has pressured speech.  She has tangential speech.  It is hard to follow her from one topic to the next.  She also reports OCD-like tendencies.  For instance she states that if she sees a total crossing the road, she has to stop and turn around and go take him off the road or she will be unable to drive along further.  She frequently mentions examples such as this.  I include this to demonstrate the somewhat difficult history to follow that she provides.  She denies any delusions or hallucinations or suicidal ideation.  She states that she is sleeping better and she is weaned herself off Seroquel but she does seem somewhat animated today and agitated raising concern about possible bipolar.  She has an appointment next week to see a psychiatrist.  Today she reports recalcitrant heartburn.  She is on Pepcid.  She reports a burning sensation up and down her esophagus made worse by eating.  She denies any melena or hematochezia or pleurisy or hemoptysis or fever.  Past Medical History:  Diagnosis Date  . ADHD (attention deficit hyperactivity disorder)   . Anemia   . Atypical mole 07/27/2012   moderate right tricep  . Atypical  mole 07/27/2012   moderate left outer thigh  . Atypical mole 07/27/2012   moderate right shouder  . Depression   . Fibrocystic breast changes    left  . Gastritis    egd- 2018 Dr. Collene Mares  . OSA on CPAP    Past Surgical History:  Procedure Laterality Date  . BREAST BIOPSY Left 2012  . FINGER SURGERY  2020  . GYNECOLOGIC CRYOSURGERY    . NASAL SEPTUM SURGERY  1994  . PELVIC LAPAROSCOPY  12/09/2008   PELVIC WASHINGS, EXCISION OF TORSED LEFT PARATUBULAR CYST. ROLLERBALL ENDOMETRIAL ABLATION   Current Outpatient Medications on File Prior to Visit  Medication Sig Dispense Refill  . albuterol (PROVENTIL HFA;VENTOLIN HFA) 108 (90 Base) MCG/ACT inhaler TAKE 2 PUFFS INTO LUNGS EVERY 6 HOURS AS NEEDED FOR WHEEZE OR SHORTNESS OF BREATH 8.5 Inhaler 0  . Biotin 5000 MCG CAPS Take by mouth.    . Cholecalciferol 100 MCG (4000 UT) CAPS Take by mouth.    . COLLAGEN PO Take by mouth.    . diazepam (VALIUM) 10 MG tablet Take 1 tablet (10 mg total) by mouth at bedtime as needed for anxiety or sleep. 30 tablet 1  . escitalopram (LEXAPRO) 10 MG tablet Take 1 tablet (10 mg total) by mouth daily. 30 tablet 3  . estradiol (VIVELLE-DOT) 0.05 MG/24HR patch Place 1 patch (0.05 mg total) onto the skin 2 (two) times a week. 24 patch  4  . Garlic Oil 161 MG TABS Take by mouth daily.    Marland Kitchen loratadine (CLARITIN) 10 MG tablet Take 10 mg by mouth daily.    . Medium Chain Triglycerides (MCT OIL PO) Take by mouth.    . Multiple Vitamin (MULTIVITAMIN) tablet Take 1 tablet by mouth daily.    . mupirocin cream (BACTROBAN) 2 % Apply 1 application topically 2 (two) times daily. 15 g 3  . Probiotic Product (PROBIOTIC DAILY PO) Take by mouth.    . progesterone (PROMETRIUM) 200 MG capsule Take 1 capsule (200 mg total) by mouth daily. First 12 days of the month at bedtime 36 capsule 4  . QUEtiapine (SEROQUEL) 200 MG tablet Take 1 tablet (200 mg total) by mouth at bedtime. 30 tablet 0  . QUEtiapine (SEROQUEL) 25 MG tablet Take 1  tablet (25 mg total) by mouth 2 (two) times daily. 60 tablet 0  . Safflower Oil (CLA) 1000 MG CAPS Take by mouth. TEIM- CLA AND COLLAGEN     No current facility-administered medications on file prior to visit.   Allergies  Allergen Reactions  . Clarithromycin   . Codeine Nausea And Vomiting   Social History   Socioeconomic History  . Marital status: Married    Spouse name: Not on file  . Number of children: Not on file  . Years of education: Not on file  . Highest education level: Not on file  Occupational History  . Not on file  Tobacco Use  . Smoking status: Never Smoker  . Smokeless tobacco: Never Used  Vaping Use  . Vaping Use: Never used  Substance and Sexual Activity  . Alcohol use: Yes    Alcohol/week: 0.0 standard drinks    Comment: WINE - rare  . Drug use: No  . Sexual activity: Yes    Birth control/protection: None, Other-see comments    Comment: PATIENT'S HUSBAND WITH VASECTOMY  Other Topics Concern  . Not on file  Social History Narrative  . Not on file   Social Determinants of Health   Financial Resource Strain:   . Difficulty of Paying Living Expenses: Not on file  Food Insecurity:   . Worried About Charity fundraiser in the Last Year: Not on file  . Ran Out of Food in the Last Year: Not on file  Transportation Needs:   . Lack of Transportation (Medical): Not on file  . Lack of Transportation (Non-Medical): Not on file  Physical Activity:   . Days of Exercise per Week: Not on file  . Minutes of Exercise per Session: Not on file  Stress:   . Feeling of Stress : Not on file  Social Connections:   . Frequency of Communication with Friends and Family: Not on file  . Frequency of Social Gatherings with Friends and Family: Not on file  . Attends Religious Services: Not on file  . Active Member of Clubs or Organizations: Not on file  . Attends Archivist Meetings: Not on file  . Marital Status: Not on file  Intimate Partner Violence:   .  Fear of Current or Ex-Partner: Not on file  . Emotionally Abused: Not on file  . Physically Abused: Not on file  . Sexually Abused: Not on file   Family History  Problem Relation Age of Onset  . Hyperlipidemia Mother   . Alzheimer's disease Mother   . Breast cancer Maternal Aunt   . Breast cancer Maternal Aunt   . Stroke Father   .  Alcohol abuse Father   . Breast cancer Cousin       Review of Systems  All other systems reviewed and are negative.      Objective:   Physical Exam Vitals reviewed.  Constitutional:      General: She is not in acute distress.    Appearance: She is well-developed. She is not diaphoretic.  HENT:     Head: Normocephalic and atraumatic.     Right Ear: External ear normal.     Left Ear: External ear normal.     Nose: Nose normal.     Mouth/Throat:     Pharynx: No oropharyngeal exudate.  Eyes:     General: No scleral icterus.       Right eye: No discharge.        Left eye: No discharge.     Conjunctiva/sclera: Conjunctivae normal.     Pupils: Pupils are equal, round, and reactive to light.  Neck:     Thyroid: No thyromegaly.     Vascular: No JVD.     Trachea: No tracheal deviation.  Cardiovascular:     Rate and Rhythm: Normal rate and regular rhythm.     Heart sounds: Normal heart sounds. No murmur heard.  No friction rub. No gallop.   Pulmonary:     Effort: Pulmonary effort is normal. No respiratory distress.     Breath sounds: Normal breath sounds. No stridor. No wheezing or rales.  Chest:     Chest wall: No tenderness.  Abdominal:     General: Bowel sounds are normal. There is no distension.     Palpations: Abdomen is soft. There is no mass.     Tenderness: There is no abdominal tenderness. There is no guarding or rebound.  Musculoskeletal:        General: No tenderness or deformity. Normal range of motion.     Cervical back: Normal range of motion and neck supple.  Lymphadenopathy:     Cervical: No cervical adenopathy.  Skin:     General: Skin is warm.     Coloration: Skin is not pale.     Findings: No erythema or rash.  Neurological:     Mental Status: She is alert and oriented to person, place, and time.     Cranial Nerves: No cranial nerve deficit.     Motor: No abnormal muscle tone.     Coordination: Coordination normal.     Deep Tendon Reflexes: Reflexes are normal and symmetric.  Psychiatric:        Behavior: Behavior normal.        Thought Content: Thought content normal.        Judgment: Judgment normal.           Assessment & Plan:  Major depressive disorder with current active episode, unspecified depression episode severity, unspecified whether recurrent  GAD (generalized anxiety disorder)  We will treat GERD by adding Protonix 40 mg a day to Pepcid.  She can temporarily use Maalox for breakthrough symptoms.  Reassess in 2 weeks.  I certainly think the patient is dealing with anxiety and depression and OCD type tendencies and therefore I think Lexapro is a good choice for her.  However she also seems somewhat animated.  She has been having insomnia.  She is been having racing thoughts.  She demonstrates pressured speech.  I question if she is showing bipolar tendencies.  I believe she would benefit from a mood stabilizer such as Lamictal.  However I will defer  to psychiatry's opinion.  I discussed this with the patient and she does not feel that she has bipolar tendencies but I would like her to discuss this further with a psychiatrist.

## 2019-12-29 ENCOUNTER — Other Ambulatory Visit: Payer: Self-pay

## 2019-12-31 ENCOUNTER — Telehealth: Payer: Self-pay | Admitting: Family Medicine

## 2019-12-31 NOTE — Telephone Encounter (Signed)
received fax from [facility] on [address]. Requesting a refill on QUEtiapine (SEROQUEL) 25 MG tablet Sig: take 1 tablet by mouth twice a day QTY: 180  Quetiapine fumarate 200 mg tab take 1 tablet by mouth at bedtime QTY 90

## 2020-01-02 ENCOUNTER — Other Ambulatory Visit: Payer: Self-pay

## 2020-01-02 MED ORDER — QUETIAPINE FUMARATE 200 MG PO TABS
200.0000 mg | ORAL_TABLET | Freq: Every day | ORAL | 0 refills | Status: DC
Start: 2020-01-02 — End: 2020-02-29

## 2020-01-02 MED ORDER — QUETIAPINE FUMARATE 25 MG PO TABS: 25.0000 mg | ORAL_TABLET | Freq: Two times a day (BID) | ORAL | 0 refills | Status: AC

## 2020-01-11 ENCOUNTER — Telehealth: Payer: Self-pay | Admitting: Neurology

## 2020-01-11 NOTE — Telephone Encounter (Signed)
I called Kristin Fireman, PA with [facility]. Pt was seen today and she will be fitted for an oral appliance, per Dr. Guadelupe Sabin recommendations.

## 2020-01-11 NOTE — Telephone Encounter (Signed)
PA  Madilyn Fireman [facility] is asking for a call from RN for Dr Rexene Alberts.  Kristin Whitney can be reached at [phone]

## 2020-01-20 ENCOUNTER — Other Ambulatory Visit: Payer: Self-pay | Admitting: Family Medicine

## 2020-01-20 DIAGNOSIS — Z1231 Encounter for screening mammogram for malignant neoplasm of breast: Secondary | ICD-10-CM

## 2020-02-08 ENCOUNTER — Other Ambulatory Visit: Payer: Self-pay

## 2020-02-08 ENCOUNTER — Ambulatory Visit: Payer: BC Managed Care – PPO | Admitting: Orthopaedic Surgery

## 2020-02-08 ENCOUNTER — Encounter: Payer: Self-pay | Admitting: Orthopaedic Surgery

## 2020-02-08 VITALS — Ht 62.0 in | Wt 140.0 lb

## 2020-02-08 DIAGNOSIS — M7541 Impingement syndrome of right shoulder: Secondary | ICD-10-CM | POA: Diagnosis not present

## 2020-02-08 NOTE — Progress Notes (Signed)
Office Visit Note   Patient: Kristin Whitney           Date of Birth: [birth year]           MRN: [id number] Visit Date: 02/08/2020              Requested by: Susy Frizzle, MD [id number] [place],  [place] [address] PCP: Susy Frizzle, MD   Assessment & Plan: Visit Diagnoses:  1. Impingement syndrome of right shoulder     Plan: Right shoulder injection performed which she tolerated well.  If she has persistent problems she will let us know and we can proceed with diagnostic imaging to rule out rotator cuff tendinopathy.  No change in her medications.  Follow-Up Instructions: No follow-ups on file.   Orders:  Orders Placed This Encounter  Procedures  . Large Joint Inj: R subacromial bursa   No orders of the defined types were placed in this encounter.     Procedures: Large Joint Inj: R subacromial bursa on 02/08/2020 9:31 AM Indications: pain Details: 22 G 1.5 in needle  Arthrogram: No  Medications: 4 mL bupivacaine 0.25 %; 40 mg methylPREDNISolone acetate 40 MG/ML; 0.5 mL lidocaine 1 % Outcome: tolerated well, no immediate complications Procedure, treatment alternatives, risks and benefits explained, specific risks discussed. Consent was given by the patient. Immediately prior to procedure a time out was called to verify the correct patient, procedure, equipment, support staff and site/side marked as required. Patient was prepped and draped in the usual sterile fashion.       Clinical Data: No additional findings.   Subjective: Chief Complaint  Patient presents with  . Right Shoulder - Pain    HPI 59 year old female returns with ongoing problems with right shoulder pain.  She had previous injection in June and states she got good relief but recently last few weeks she has had some increased recurrent symptoms with outstretch reaching and overhead activities.  Previous treated for proximal phalanx fracture with pinning, thoracic scoliosis  with some progression of the last 12 years.  No chills or fever no associated neck pain.  Review of Systems all other systems noncontributory.  She has had some problems with anxiety and depression.   Objective: Vital Signs: Ht 5\' 2"  (1.575 m)   Wt 140 lb (63.5 kg)   LMP 06/09/2018 (Within Weeks)   BMI 25.61 kg/m   Physical Exam Constitutional:      Appearance: She is well-developed.  HENT:     Head: Normocephalic.     Right Ear: External ear normal.     Left Ear: External ear normal.  Eyes:     Pupils: Pupils are equal, round, and reactive to light.  Neck:     Thyroid: No thyromegaly.     Trachea: No tracheal deviation.  Cardiovascular:     Rate and Rhythm: Normal rate.  Pulmonary:     Effort: Pulmonary effort is normal.  Abdominal:     Palpations: Abdomen is soft.  Skin:    General: Skin is warm and dry.  Neurological:     Mental Status: She is alert and oriented to person, place, and time.  Psychiatric:        Behavior: Behavior normal.     Ortho Exam patient has negative drop arm test positive impingement right shoulder long head of the biceps minimally tender no subluxation.  No brachial plexus tenderness negative Spurling.  No atrophy biceps triceps.  Sensation hand is intact.  Specialty Comments:  No specialty comments available.  Imaging: No results found.   PMFS History: Patient Active Problem List   Diagnosis Date Noted  . Impingement syndrome of right shoulder 09/06/2019  . Finger fracture, right 09/28/2018  . Gastritis   . Neck pain 08/14/2016  . Tonsillar calculus 08/14/2016  . 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 Irregular menstrual cycle 04/28/2016  . History of vitamin D deficiency 12/05/2014  . Perimenopause 12/05/2014  . History of anemia 06/07/2012  . Tiredness 06/07/2012  . Menstrual migraine 06/02/2011   Past Medical History:  Diagnosis Date  . ADHD (attention deficit hyperactivity disorder)   . Anemia   . Atypical mole  07/27/2012   moderate right tricep  . Atypical mole 07/27/2012   moderate left outer thigh  . Atypical mole 07/27/2012   moderate right shouder  . Depression   . Fibrocystic breast changes    left  . Gastritis    egd- 2018 Dr. Collene Mares  . OSA on CPAP     Family History  Problem Relation Age of Onset  . Hyperlipidemia Mother   . Alzheimer's disease Mother   . Breast cancer Maternal Aunt   . Breast cancer Maternal Aunt   . Stroke Father   . Alcohol abuse Father   . Breast cancer Cousin     Past Surgical History:  Procedure Laterality Date  . BREAST BIOPSY Left 2012  . FINGER SURGERY  2020  . GYNECOLOGIC CRYOSURGERY    . NASAL SEPTUM SURGERY  1994  . PELVIC LAPAROSCOPY  12/09/2008   PELVIC WASHINGS, EXCISION OF TORSED LEFT PARATUBULAR CYST. ROLLERBALL ENDOMETRIAL ABLATION   Social History   Occupational History  . Not on file  Tobacco Use  . Smoking status: Never Smoker  . Smokeless tobacco: Never Used  Vaping Use  . Vaping Use: Never used  Substance and Sexual Activity  . Alcohol use: Yes    Alcohol/week: 0.0 standard drinks    Comment: WINE - rare  . Drug use: No  . Sexual activity: Yes    Birth control/protection: None, Other-see comments    Comment: PATIENT'S HUSBAND WITH VASECTOMY

## 2020-02-13 MED ORDER — LIDOCAINE HCL 1 % IJ SOLN
0.5000 mL | INTRAMUSCULAR | Status: AC | PRN
  Administered 2020-02-08: .5 mL

## 2020-02-13 MED ORDER — METHYLPREDNISOLONE ACETATE 40 MG/ML IJ SUSP
40.0000 mg | INTRAMUSCULAR | Status: AC | PRN
  Administered 2020-02-08: 40 mg via INTRA_ARTICULAR

## 2020-02-13 MED ORDER — BUPIVACAINE HCL 0.25 % IJ SOLN
4.0000 mL | INTRAMUSCULAR | Status: AC | PRN
  Administered 2020-02-08: 4 mL via INTRA_ARTICULAR

## 2020-02-29 ENCOUNTER — Encounter: Payer: Self-pay | Admitting: Obstetrics & Gynecology

## 2020-02-29 ENCOUNTER — Ambulatory Visit (INDEPENDENT_AMBULATORY_CARE_PROVIDER_SITE_OTHER): Payer: BC Managed Care – PPO | Admitting: Obstetrics & Gynecology

## 2020-02-29 ENCOUNTER — Other Ambulatory Visit: Payer: Self-pay

## 2020-02-29 VITALS — BP 132/80 | Ht 62.0 in | Wt 151.0 lb

## 2020-02-29 DIAGNOSIS — R140 Abdominal distension (gaseous): Secondary | ICD-10-CM | POA: Diagnosis not present

## 2020-02-29 DIAGNOSIS — Z01419 Encounter for gynecological examination (general) (routine) without abnormal findings: Secondary | ICD-10-CM | POA: Diagnosis not present

## 2020-02-29 DIAGNOSIS — Z79890 Hormone replacement therapy (postmenopausal): Secondary | ICD-10-CM | POA: Diagnosis not present

## 2020-02-29 MED ORDER — PROGESTERONE MICRONIZED 100 MG PO CAPS: 100.0000 mg | ORAL_CAPSULE | Freq: Every day | ORAL | 4 refills | Status: AC

## 2020-02-29 MED ORDER — ESTRADIOL 0.05 MG/24HR TD PTTW: 1.0000 | MEDICATED_PATCH | TRANSDERMAL | 4 refills | Status: AC

## 2020-02-29 NOTE — Progress Notes (Signed)
Kristin Whitney [facility] [birth year] [id number]   History:    59 y.o. G2P2L2 Married.  Has 2 grand-children.  RP:  Established patient presenting for annual gyn exam   HPI: Postmenopause, well on HRT with Estradiol 0.05 patch twice a week and Prometrium 200 mg HS x 12 days a month, but having withdrawal bleeding most months.  No pelvic pain.  Some intermittent bloating and Lt upper quadrant pain, will make an appointment with Dr Collene Mares.  Superficial dyspareunia now that husband has an inflatable penile prosthesis.  Breasts normal.  BMI 27.62.  Planning to get back to fitness.  Healthy nutrition.  Health labs with Fam MD.  Past medical history,surgical history, family history and social history were all reviewed and documented in the EPIC chart.  Gynecologic History Patient's last menstrual period was 02/22/2020.  Obstetric History OB History  Gravida Para Term Preterm AB Living  2 2 1     2   SAB IAB Ectopic Multiple Live Births          2    # Outcome Date GA Lbr Len/2nd Weight Sex Delivery Anes PTL Lv  2 Para     F Vag-Spont  N LIV  1 Term     M Vag-Spont  N LIV     ROS: A ROS was performed and pertinent positives and negatives are included in the history.  GENERAL: No fevers or chills. HEENT: No change in vision, no earache, sore throat or sinus congestion. NECK: No pain or stiffness. CARDIOVASCULAR: No chest pain or pressure. No palpitations. PULMONARY: No shortness of breath, cough or wheeze. GASTROINTESTINAL: No abdominal pain, nausea, vomiting or diarrhea, melena or bright red blood per rectum. GENITOURINARY: No urinary frequency, urgency, hesitancy or dysuria. MUSCULOSKELETAL: No joint or muscle pain, no back pain, no recent trauma. DERMATOLOGIC: No rash, no itching, no lesions. ENDOCRINE: No polyuria, polydipsia, no heat or cold intolerance. No recent change in weight. HEMATOLOGICAL: No anemia or easy bruising or bleeding. NEUROLOGIC: No headache, seizures, numbness, tingling or  weakness. PSYCHIATRIC: No depression, no loss of interest in normal activity or change in sleep pattern.     Exam:   BP 132/80 (BP Location: Right Arm, Patient Position: Sitting, Cuff Size: Normal)   Ht 5\' 2"  (1.575 m)   Wt 151 lb (68.5 kg)   LMP 02/22/2020   BMI 27.62 kg/m   Body mass index is 27.62 kg/m.  General appearance : Well developed well nourished female. No acute distress HEENT: Eyes: no retinal hemorrhage or exudates,  Neck supple, trachea midline, no carotid bruits, no thyroidmegaly Lungs: Clear to auscultation, no rhonchi or wheezes, or rib retractions  Heart: Regular rate and rhythm, no murmurs or gallops Breast:Examined in sitting and supine position were symmetrical in appearance, no palpable masses or tenderness,  no skin retraction, no nipple inversion, no nipple discharge, no skin discoloration, no axillary or supraclavicular lymphadenopathy Abdomen: no palpable masses or tenderness, no rebound or guarding Extremities: no edema or skin discoloration or tenderness  Pelvic: Vulva: Normal             Vagina: No gross lesions or discharge  Cervix: No gross lesions or discharge.  Pap reflex done.  Uterus  AV, normal size, shape and consistency, non-tender and mobile  Adnexa  Without masses or tenderness  Anus: Normal   Assessment/Plan:  59 y.o. female for annual exam   1. Encounter for routine gynecological examination with Papanicolaou smear of cervix Normal gynecologic exam in menopause.  Pap reflex done.  Breast exam normal.  Screening mammogram scheduled.  Colonoscopy 2018.  Health labs with family physician.  Body mass index 27.62.  Planning to increase fitness activities.  Healthy nutrition.  2. Postmenopausal hormone replacement therapy Well on HRT, but would like to avoid withdrawal bleeding.  Will change to continuous Progesterone.  Estradiol patch 0.05 twice a week and Prometrium 100 mg at bedtime daily prescribed.  3. Abdominal bloating Left upper  quadrant intermittent pain.  Pelvic US 08/2019 Negative.  Will f/u with Dr Collene Mares to investigate.  Other orders - estradiol (VIVELLE-DOT) 0.05 MG/24HR patch; Place 1 patch (0.05 mg total) onto the skin 2 (two) times a week. - progesterone (PROMETRIUM) 100 MG capsule; Take 1 capsule (100 mg total) by mouth at bedtime.  Princess Bruins MD, 2:45 PM 02/29/2020

## 2020-02-29 NOTE — Addendum Note (Signed)
Addended by: Lorine Bears on: 02/29/2020 04:43 PM   Modules accepted: Orders

## 2020-03-05 LAB — PAP IG W/ RFLX HPV ASCU

## 2020-03-07 ENCOUNTER — Other Ambulatory Visit: Payer: Self-pay

## 2020-03-07 ENCOUNTER — Ambulatory Visit
Admission: RE | Admit: 2020-03-07 | Discharge: 2020-03-07 | Disposition: A | Discharge status: Home / Self Care | Payer: BC Managed Care – PPO | Source: Ambulatory Visit | Attending: Family Medicine | Admitting: Family Medicine

## 2020-03-07 DIAGNOSIS — Z1231 Encounter for screening mammogram for malignant neoplasm of breast: Secondary | ICD-10-CM

## 2020-03-15 ENCOUNTER — Ambulatory Visit: Payer: BC Managed Care – PPO

## 2020-06-13 ENCOUNTER — Other Ambulatory Visit: Payer: Self-pay | Admitting: Family Medicine

## 2020-06-13 ENCOUNTER — Other Ambulatory Visit: Payer: Self-pay | Admitting: Certified Nurse Midwife

## 2020-06-15 ENCOUNTER — Other Ambulatory Visit: Payer: Self-pay | Admitting: Family Medicine

## 2020-06-15 DIAGNOSIS — R102 Pelvic and perineal pain: Secondary | ICD-10-CM

## 2020-06-18 ENCOUNTER — Ambulatory Visit
Admission: RE | Admit: 2020-06-18 | Discharge: 2020-06-18 | Disposition: A | Discharge status: Home / Self Care | Payer: BC Managed Care – PPO | Source: Ambulatory Visit | Attending: Family Medicine | Admitting: Family Medicine

## 2020-06-18 DIAGNOSIS — R102 Pelvic and perineal pain: Secondary | ICD-10-CM

## 2020-07-30 ENCOUNTER — Other Ambulatory Visit: Payer: Self-pay | Admitting: Gastroenterology

## 2020-07-30 DIAGNOSIS — R1032 Left lower quadrant pain: Secondary | ICD-10-CM

## 2020-08-17 ENCOUNTER — Ambulatory Visit
Admission: RE | Admit: 2020-08-17 | Discharge: 2020-08-17 | Disposition: A | Discharge status: Home / Self Care | Payer: BC Managed Care – PPO | Source: Ambulatory Visit | Attending: Gastroenterology | Admitting: Gastroenterology

## 2020-08-17 ENCOUNTER — Other Ambulatory Visit: Payer: Self-pay

## 2020-08-17 DIAGNOSIS — R1032 Left lower quadrant pain: Secondary | ICD-10-CM

## 2020-08-17 MED ORDER — IOPAMIDOL (ISOVUE-300) INJECTION 61%
100.0000 mL | Freq: Once | INTRAVENOUS | Status: AC | PRN
  Administered 2020-08-17: 10:00:00 100 mL via INTRAVENOUS

## 2020-11-15 IMAGING — MG DIGITAL SCREENING BILATERAL MAMMOGRAM WITH TOMO AND CAD
8 series · 8 of 24 positions shown · non-contrast
Comparison: Previous exam(s).

CLINICAL DATA: Screening.

EXAM:
DIGITAL SCREENING BILATERAL MAMMOGRAM WITH TOMO AND CAD

[L MLO synth-2D]
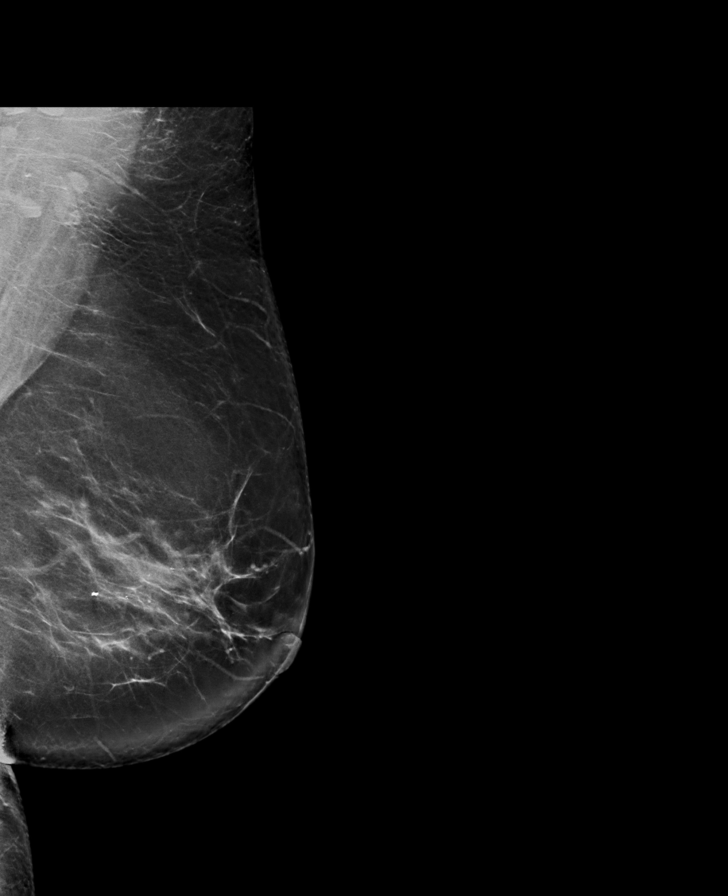

[R MLO synth-2D]
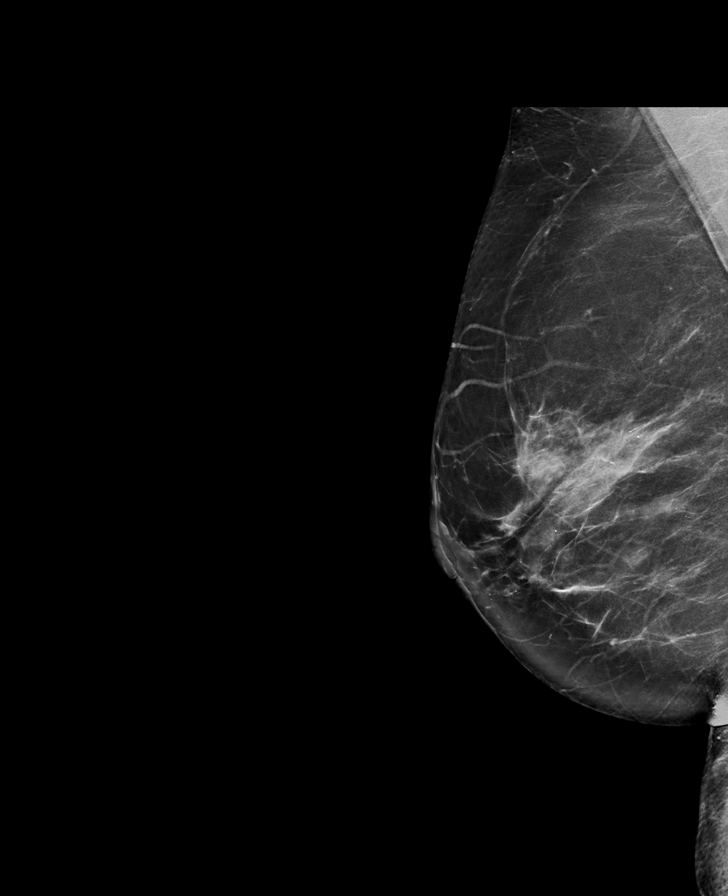

[L CC synth-2D]
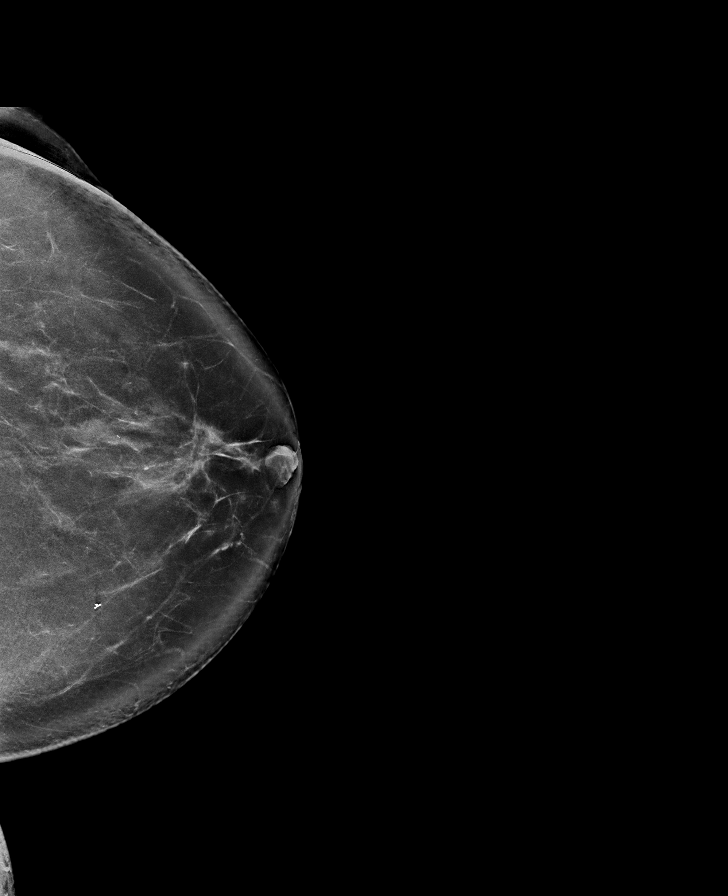

[R CC synth-2D]
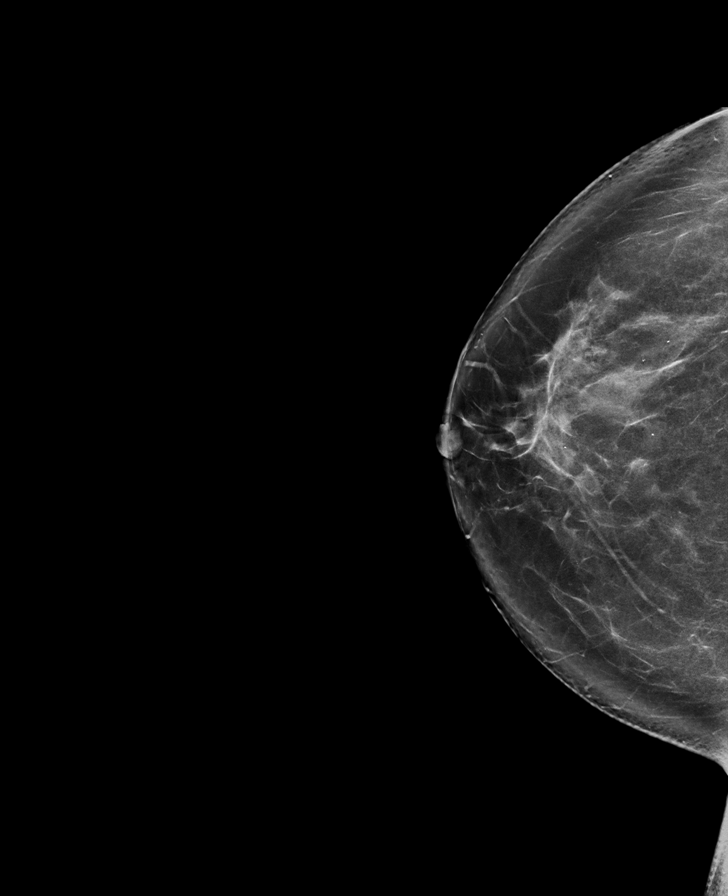

[R CC tomo · tomo slice 43/85.0]
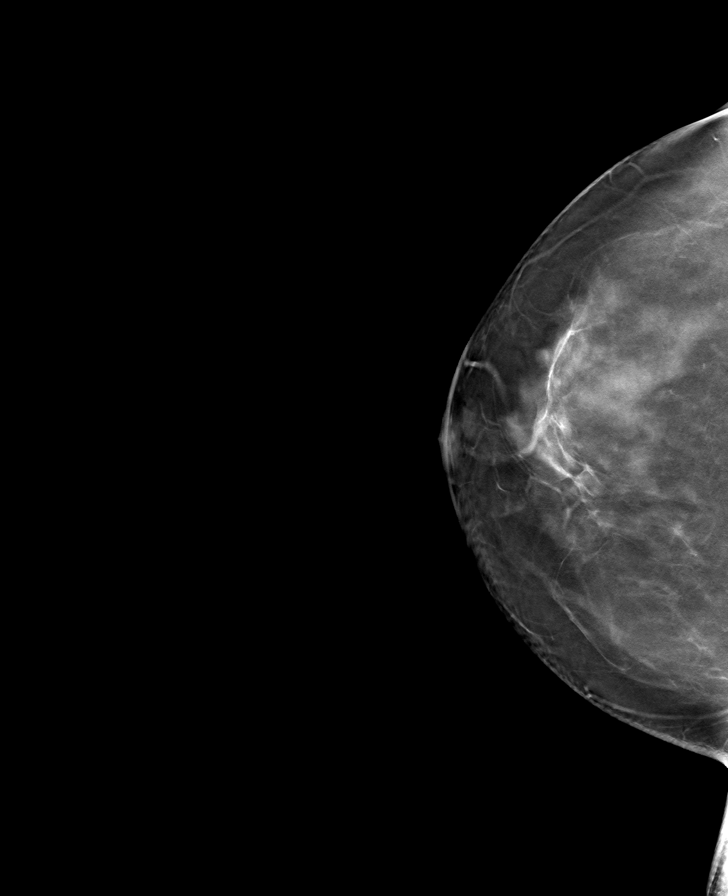

[L MLO tomo · tomo slice 45/90.0]
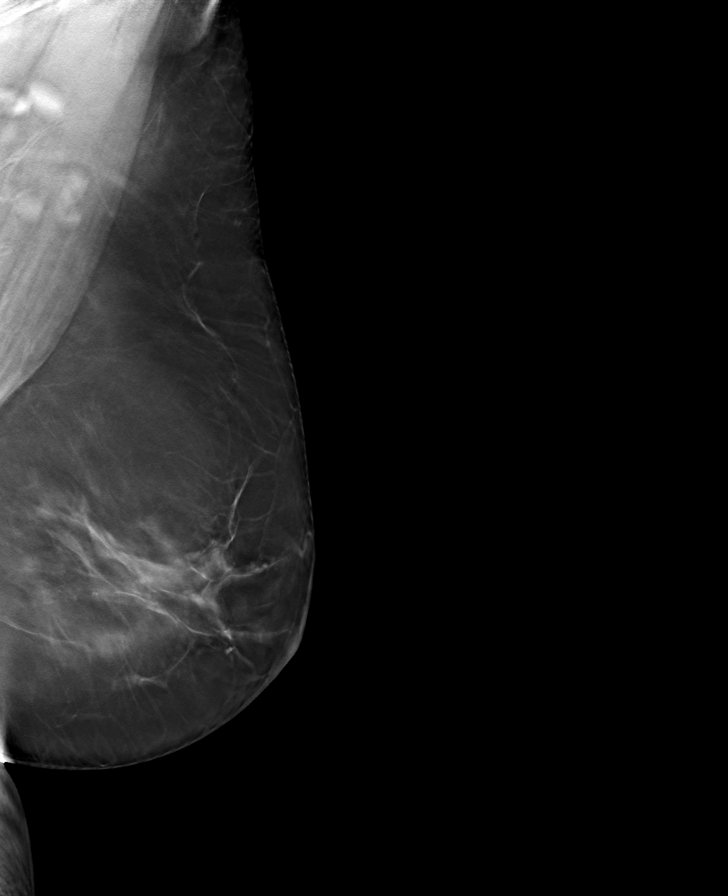

[R MLO tomo · tomo slice 42/83.0]
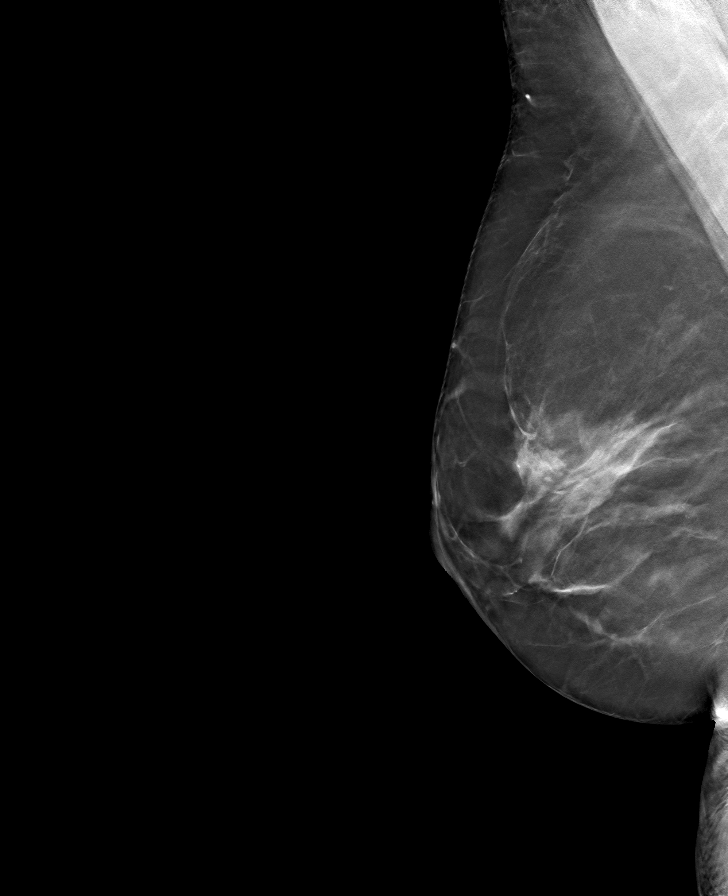

[L CC tomo · tomo slice 47/94.0]
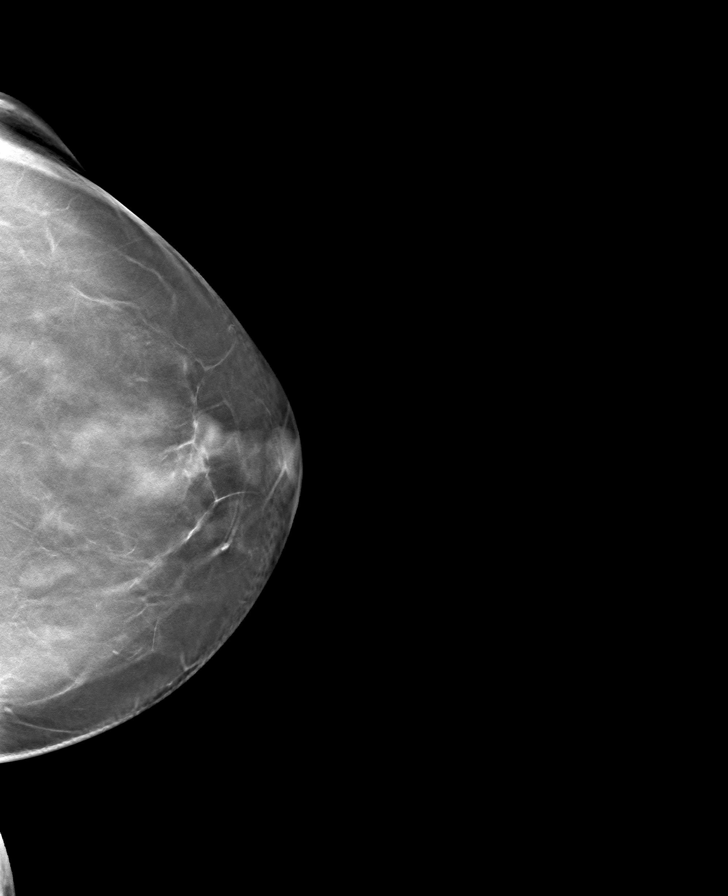

[8 of 24 positions shown; findings below may reference images not displayed]

ACR Breast Density Category c: The breast tissue is heterogeneously
dense, which may obscure small masses.
FINDINGS: There are no findings suspicious for malignancy. Images were
processed with CAD.
IMPRESSION: No mammographic evidence of malignancy. A result letter of this
screening mammogram will be mailed directly to the patient.

RECOMMENDATION:
Screening mammogram in one year. (Code:FT-U-LHB)

BI-RADS CATEGORY  1: Negative.

## 2021-02-15 ENCOUNTER — Inpatient Hospital Stay (HOSPITAL_COMMUNITY): Admission: RE | Admit: 2021-02-15 | Payer: BC Managed Care – PPO | Source: Ambulatory Visit

## 2021-02-15 DIAGNOSIS — Z1231 Encounter for screening mammogram for malignant neoplasm of breast: Secondary | ICD-10-CM

## 2021-03-08 ENCOUNTER — Other Ambulatory Visit (HOSPITAL_COMMUNITY): Payer: Self-pay | Admitting: Family Medicine

## 2021-03-08 ENCOUNTER — Ambulatory Visit (HOSPITAL_COMMUNITY)
Admission: RE | Admit: 2021-03-08 | Discharge: 2021-03-08 | Disposition: A | Discharge status: Home / Self Care | Payer: BC Managed Care – PPO | Source: Ambulatory Visit | Attending: Family Medicine | Admitting: Family Medicine

## 2021-03-08 ENCOUNTER — Other Ambulatory Visit: Payer: Self-pay

## 2021-03-08 DIAGNOSIS — Z1231 Encounter for screening mammogram for malignant neoplasm of breast: Secondary | ICD-10-CM

## 2021-10-23 ENCOUNTER — Ambulatory Visit: Payer: BC Managed Care – PPO | Admitting: Orthopaedic Surgery

## 2021-10-23 ENCOUNTER — Encounter: Payer: Self-pay | Admitting: Orthopaedic Surgery

## 2021-10-23 ENCOUNTER — Ambulatory Visit (INDEPENDENT_AMBULATORY_CARE_PROVIDER_SITE_OTHER): Payer: BC Managed Care – PPO

## 2021-10-23 VITALS — BP 168/97 | HR 71 | Ht 62.0 in | Wt 154.0 lb

## 2021-10-23 DIAGNOSIS — M7062 Trochanteric bursitis, left hip: Secondary | ICD-10-CM | POA: Diagnosis not present

## 2021-10-23 DIAGNOSIS — M25552 Pain in left hip: Secondary | ICD-10-CM

## 2021-10-23 NOTE — Progress Notes (Signed)
Office Visit Note   Patient: Kristin Whitney           Date of Birth: 12-24-1960           MRN: [id number] Visit Date: 10/23/2021              Requested by: Hayden Rasmussen, MD [address] [place],  [place] [address] PCP: Hayden Rasmussen, MD   Assessment & Plan: Visit Diagnoses:  1. Pain in left hip   2. Trochanteric bursitis, left hip     Plan: Left trochanteric injection performed which she tolerated well.  She will follow-up if she has persistent symptoms.  Follow-Up Instructions: No follow-ups on file.   Orders:  Orders Placed This Encounter  Procedures   Large Joint Inj: L greater trochanter   XR HIP UNILAT W OR W/O PELVIS 2-3 VIEWS LEFT   No orders of the defined types were placed in this encounter.     Procedures: Large Joint Inj: L greater trochanter on 10/23/2021 8:55 AM Details: lateral approach Medications: 1 mL lidocaine 1 %; 2 mL bupivacaine 0.25 %; 40 mg methylPREDNISolone acetate 40 MG/ML      Clinical Data: No additional findings.   Subjective: Chief Complaint  Patient presents with   Left Hip - Pain    HPI 61 year old female returns she states she had onset with pain laterally in her hip since 05/02/2021 when she drove to carry take her dog for that visit.  She had pain when she lays on her left side points to the trochanter.  Pain radiates down her leg stops above the knee.  She has used ibuprofen without relief.  No bowel bladder symptoms no fever or chills.  Review of Systems all other systems noncontributory to HPI 14 point system update.   Objective: Vital Signs: BP (!) 168/97   Pulse 71   Ht '5\' 2"'$  (1.575 m)   Wt 154 lb (69.9 kg)   LMP 02/22/2020   BMI 28.17 kg/m   Physical Exam Constitutional:      Appearance: She is well-developed.  HENT:     Head: Normocephalic.     Right Ear: External ear normal.     Left Ear: External ear normal. There is no impacted cerumen.  Eyes:     Pupils: Pupils are equal,  round, and reactive to light.  Neck:     Thyroid: No thyromegaly.     Trachea: No tracheal deviation.  Cardiovascular:     Rate and Rhythm: Normal rate.  Pulmonary:     Effort: Pulmonary effort is normal.  Abdominal:     Palpations: Abdomen is soft.  Musculoskeletal:     Cervical back: No rigidity.  Skin:    General: Skin is warm and dry.  Neurological:     Mental Status: She is alert and oriented to person, place, and time.  Psychiatric:        Behavior: Behavior normal.     Ortho Exam patient has intact pulses negative logroll the hips.  Tenderness over the left trochanter reproducing her pain.  No tenderness over the right trochanter.  Knee and ankle jerk are intact.  No lumbar tenderness.  Specialty Comments:  No specialty comments available.  Imaging: No results found.   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 Collene Mares   OSA on CPAP     Family History  Problem Relation Age of Onset   Hyperlipidemia Mother    Alzheimer's disease Mother    Breast cancer Maternal Aunt    Breast cancer Maternal Aunt    Stroke Father    Alcohol abuse Father    Breast cancer  Cousin     Past Surgical History:  Procedure Laterality Date   BREAST BIOPSY Left 2012   FINGER SURGERY  2020   GYNECOLOGIC CRYOSURGERY     NASAL SEPTUM SURGERY  1994   PELVIC LAPAROSCOPY  12/09/2008   PELVIC WASHINGS, EXCISION OF TORSED LEFT PARATUBULAR CYST. ROLLERBALL ENDOMETRIAL ABLATION   Social History   Occupational History   Not on file  Tobacco Use   Smoking status: Never   Smokeless tobacco: Never  Vaping Use   Vaping Use: Never used  Substance and Sexual Activity   Alcohol use: Not Currently    Alcohol/week: 0.0 standard drinks of alcohol   Drug use: No   Sexual activity: Yes    Birth control/protection: None, Other-see comments    Comment: PATIENT'S HUSBAND WITH VASECTOMY

## 2021-10-28 DIAGNOSIS — M7062 Trochanteric bursitis, left hip: Secondary | ICD-10-CM | POA: Insufficient documentation

## 2021-10-28 MED ORDER — BUPIVACAINE HCL 0.25 % IJ SOLN
2.0000 mL | INTRAMUSCULAR | Status: AC | PRN
  Administered 2021-10-23: 2 mL via INTRA_ARTICULAR

## 2021-10-28 MED ORDER — LIDOCAINE HCL 1 % IJ SOLN
1.0000 mL | INTRAMUSCULAR | Status: AC | PRN
  Administered 2021-10-23: 1 mL

## 2021-10-28 MED ORDER — METHYLPREDNISOLONE ACETATE 40 MG/ML IJ SUSP
40.0000 mg | INTRAMUSCULAR | Status: AC | PRN
  Administered 2021-10-23: 40 mg via INTRA_ARTICULAR

## 2021-11-20 ENCOUNTER — Telehealth: Payer: Self-pay | Admitting: Orthopaedic Surgery

## 2021-11-20 DIAGNOSIS — M79605 Pain in left leg: Secondary | ICD-10-CM

## 2021-11-20 DIAGNOSIS — M5126 Other intervertebral disc displacement, lumbar region: Secondary | ICD-10-CM

## 2021-11-20 NOTE — Telephone Encounter (Signed)
MRI entered. I called patient and advised.

## 2021-11-20 NOTE — Telephone Encounter (Signed)
Please advise 

## 2021-11-20 NOTE — Addendum Note (Signed)
Addended by: Meyer Cory on: 11/20/2021 03:22 PM   Modules accepted: Orders

## 2021-11-20 NOTE — Telephone Encounter (Signed)
Pt states that the injection helped in her hip for a few days but now the pain is back worse. The pain is radiating down her legs from lower back. She was hoping you guys could order her an MRI or something?   CB [phone]

## 2021-12-02 ENCOUNTER — Ambulatory Visit
Admission: RE | Admit: 2021-12-02 | Discharge: 2021-12-02 | Disposition: A | Discharge status: Home / Self Care | Payer: BC Managed Care – PPO | Source: Ambulatory Visit | Attending: Orthopaedic Surgery | Admitting: Orthopaedic Surgery

## 2021-12-02 DIAGNOSIS — M79605 Pain in left leg: Secondary | ICD-10-CM

## 2021-12-02 DIAGNOSIS — M5126 Other intervertebral disc displacement, lumbar region: Secondary | ICD-10-CM

## 2021-12-10 ENCOUNTER — Ambulatory Visit: Payer: BC Managed Care – PPO | Admitting: Orthopaedic Surgery

## 2021-12-10 ENCOUNTER — Encounter: Payer: Self-pay | Admitting: Orthopaedic Surgery

## 2021-12-10 VITALS — BP 153/89 | Ht 62.0 in | Wt 154.0 lb

## 2021-12-10 DIAGNOSIS — M79605 Pain in left leg: Secondary | ICD-10-CM | POA: Diagnosis not present

## 2021-12-10 DIAGNOSIS — M47816 Spondylosis without myelopathy or radiculopathy, lumbar region: Secondary | ICD-10-CM | POA: Diagnosis not present

## 2021-12-10 NOTE — Progress Notes (Signed)
Office Visit Note   Patient: Kristin Whitney           Date of Birth: 07/22/1960           MRN: [id number] Visit Date: 12/10/2021              Requested by: Hayden Rasmussen, MD [address] [place],  [place] [address] PCP: Hayden Rasmussen, MD   Assessment & Plan: Visit Diagnoses:  1. Pain in left leg   2. Facet arthritis, degenerative, lumbar spine     Plan: We reviewed MRI scan discussed pathophysiology.  I gave her a copy of the report.  We will set up some outpatient therapy at [facility].  She lives in [place] area but works in [place] and outpatient any pain therapy would be convenient.  We can follow her up several weeks after she completes therapy.  Follow-Up Instructions: Return in about 5 weeks (around 01/14/2022).   Orders:  Orders Placed This Encounter  Procedures   Ambulatory referral to Physical Therapy   No orders of the defined types were placed in this encounter.     Procedures: No procedures performed   Clinical Data: No additional findings.   Subjective: Chief Complaint  Patient presents with   Lower Back - Pain, Follow-up    MRI review    HPI 61 year old female returns she is occasionally having some right thigh numbness and discomfort but still mostly has left buttocks pain left posterior lateral thigh pain.  MRI shows small right foraminal disc herniation at L2-3 consistent with her mild intermittent right thigh symptoms.  She has some anterolisthesis at L3-4 and L4-5 and L5-S1.  Facet degenerative changes are little bit more prominent on the left than right and may be consistent with her symptoms  .  She gets some relief with the left trochanteric injection in the past.  Review of Systems all the systems noncontributory to HPI and unchanged.   Objective: Vital Signs: BP (!) 153/89   Ht '5\' 2"'$  (1.575 m)   Wt 154 lb (69.9 kg)   LMP 02/22/2020   BMI 28.17 kg/m   Physical Exam Constitutional:      Appearance: She  is well-developed.  HENT:     Head: Normocephalic.     Right Ear: External ear normal.     Left Ear: External ear normal. There is no impacted cerumen.  Eyes:     Pupils: Pupils are equal, round, and reactive to light.  Neck:     Thyroid: No thyromegaly.     Trachea: No tracheal deviation.  Cardiovascular:     Rate and Rhythm: Normal rate.  Pulmonary:     Effort: Pulmonary effort is normal.  Abdominal:     Palpations: Abdomen is soft.  Musculoskeletal:     Cervical back: No rigidity.  Skin:    General: Skin is warm and dry.  Neurological:     Mental Status: She is alert and oriented to person, place, and time.  Psychiatric:        Behavior: Behavior normal.     Ortho Exam minimal left trochanteric bursal tenderness normal heel-toe gait.  Negative logroll to hips.  Knees reach full extension.  Specialty Comments:  No specialty comments available.  Imaging: Narrative & Impression  CLINICAL DATA:  62 year old female with back pain radiating to the left hip and the lower leg. Right thigh pain. Persistent symptoms since February.   EXAM: MRI LUMBAR SPINE WITHOUT CONTRAST  TECHNIQUE: Multiplanar, multisequence MR imaging of the lumbar spine was performed. No intravenous contrast was administered.   COMPARISON:  CT Abdomen and Pelvis 08/17/2020.   FINDINGS: Segmentation:  Normal on the comparison.   Alignment: Stable straightening of lumbar lordosis since last year. Subtle levoconvex lumbar scoliosis. And there is subtle anterolisthesis of L3 on L4.   Vertebrae: No marrow edema or evidence of acute osseous abnormality. Visualized bone marrow signal is within normal limits. Intact visible sacrum and SI joints. 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   Conus medullaris and cauda equina: Conus extends to the T12 level. No lower spinal cord or conus signal abnormality. Normal cauda equina nerve roots.   Paraspinal and other soft tissues: A 10 mm  gallstone is visible on series 11, image 6. Otherwise negative Visualized abdominal viscera and paraspinal soft tissues.   Disc levels:   Visible lower thoracic levels through T12-L1 are negative.   L1-L2:  Negative.   L2-L3: Subtle disc desiccation. Broad-based right foraminal disc protrusion best seen on series 6, image 5. No significant spinal or lateral recess stenosis, but mild to moderate right L2 neural foraminal stenosis.   L3-L4: Subtle anterolisthesis and disc bulging. Mild to moderate facet and ligament flavum hypertrophy. No significant stenosis.   L4-L5: Mild disc desiccation and disc bulging. Mild to moderate facet and ligament flavum hypertrophy. No significant stenosis.   L5-S1: Negative disc. Mild to moderate facet hypertrophy with trace degenerative facet joint fluid on the right. No stenosis.   IMPRESSION: 1. No spinal stenosis or convincing left side neural impingement. But there is a small right foraminal disc herniation at L2-L3. Query right L2 radiculitis. 2. Subtle anterolisthesis of L3 on L4 with up to moderate associated lumbar facet hypertrophy L3-L4 through L5-S1. Facet degeneration can be a source of low back pain which sometimes radiates. 3. Cholelithiasis.     Electronically Signed   By: Genevie Ann M.D.   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 Collene Mares   OSA on CPAP     Family History  Problem Relation Age of Onset   Hyperlipidemia Mother    Alzheimer's disease Mother    Breast cancer Maternal Aunt    Breast cancer Maternal Aunt    Stroke Father    Alcohol abuse Father    Breast cancer Cousin     Past Surgical History:  Procedure Laterality Date   BREAST BIOPSY Left 2012   FINGER SURGERY  2020   GYNECOLOGIC CRYOSURGERY     NASAL  SEPTUM SURGERY  1994   PELVIC LAPAROSCOPY  12/09/2008   PELVIC WASHINGS, EXCISION OF TORSED LEFT PARATUBULAR CYST. ROLLERBALL ENDOMETRIAL ABLATION   Social History   Occupational History   Not on file  Tobacco Use   Smoking status: Never   Smokeless tobacco: Never  Vaping Use   Vaping Use: Never used  Substance and Sexual Activity   Alcohol use: Not Currently    Alcohol/week: 0.0 standard drinks of alcohol   Drug use: No   Sexual activity: Yes    Birth control/protection: None, Other-see comments    Comment: PATIENT'S HUSBAND WITH VASECTOMY

## 2022-01-10 ENCOUNTER — Ambulatory Visit (HOSPITAL_COMMUNITY): Payer: BC Managed Care – PPO

## 2022-01-23 ENCOUNTER — Encounter (HOSPITAL_COMMUNITY): Payer: Self-pay | Admitting: Family Medicine

## 2022-01-23 ENCOUNTER — Other Ambulatory Visit: Payer: Self-pay | Admitting: Family Medicine

## 2022-01-23 DIAGNOSIS — Z1231 Encounter for screening mammogram for malignant neoplasm of breast: Secondary | ICD-10-CM

## 2022-02-01 NOTE — Therapy (Incomplete)
OUTPATIENT PHYSICAL THERAPY THORACOLUMBAR EVALUATION   Patient Name: Kristin Whitney MRN: [id number] DOB:Nov 27, 1960, 61 y.o., female Today's Date: 02/01/2022  END OF SESSION:   Past Medical History:  Diagnosis Date   ADHD (attention deficit hyperactivity disorder)    Anemia    Atypical mole 07/27/2012   moderate right tricep   Atypical mole 07/27/2012   moderate left outer thigh   Atypical mole 07/27/2012   moderate right shouder   Depression    Fibrocystic breast changes    left   Gastritis    egd- 2018 Dr. Collene Mares   OSA on CPAP    Past Surgical History:  Procedure Laterality Date   BREAST BIOPSY Left 2012   FINGER SURGERY  2020   GYNECOLOGIC CRYOSURGERY     NASAL SEPTUM SURGERY  1994   PELVIC LAPAROSCOPY  12/09/2008   PELVIC WASHINGS, EXCISION OF TORSED LEFT PARATUBULAR CYST. 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  ONSET DATE: ***  SUBJECTIVE:                                                                                                                                                                                           SUBJECTIVE STATEMENT: ***  PERTINENT HISTORY:  ***  PAIN:  Are you having pain? {OPRCPAIN:27236}  PRECAUTIONS: {Therapy  precautions:24002}  WEIGHT BEARING RESTRICTIONS: {Yes ***/No:24003}  FALLS:  Has patient fallen in last 6 months? {fallsyesno:27318}  LIVING ENVIRONMENT: Lives with: {OPRC lives with:25569::"lives with their family"} Lives in: {Lives in:25570} Stairs: {opstairs:27293} Has following equipment at home: {Assistive devices:23999}  OCCUPATION: ***  PLOF: {PLOF:24004}  PATIENT GOALS: ***  NEXT MD VISIT:   OBJECTIVE:   DIAGNOSTIC FINDINGS:  ***  PATIENT SURVEYS:  {rehab surveys:24030}  SCREENING FOR RED FLAGS: Bowel or bladder incontinence: {Yes/No:304960894} Spinal tumors: {Yes/No:304960894} Cauda equina syndrome: {Yes/No:304960894} Compression fracture: {Yes/No:304960894} Abdominal aneurysm: {Yes/No:304960894}  COGNITION: Overall cognitive status: {cognition:24006}     SENSATION: {sensation:27233}  MUSCLE LENGTH:  Hamstrings: Right *** deg; Left *** deg Marcello Moores test: Right *** deg; Left *** deg  POSTURE: {posture:25561}  PALPATION: ***  LUMBAR ROM:   AROM eval  Flexion   Extension   Right lateral flexion   Left lateral flexion   Right rotation   Left rotation    (Blank rows = not tested)  LOWER EXTREMITY ROM:     {AROM/PROM:27142}  Right eval Left eval  Hip flexion    Hip extension    Hip abduction    Hip adduction    Hip internal rotation    Hip external rotation    Knee flexion    Knee extension    Ankle dorsiflexion    Ankle plantarflexion    Ankle inversion    Ankle eversion     (Blank rows = not tested)  LOWER EXTREMITY MMT:    MMT Right eval Left eval  Hip flexion    Hip extension    Hip abduction    Hip adduction    Hip internal rotation    Hip external rotation    Knee flexion    Knee extension    Ankle dorsiflexion    Ankle plantarflexion    Ankle inversion    Ankle eversion     (Blank rows = not tested)  LUMBAR SPECIAL TESTS:  {lumbar special test:25242}  FUNCTIONAL TESTS:  {Functional  tests:24029}  GAIT: Distance walked: *** Assistive device utilized: {Assistive devices:23999} Level of assistance: {Levels of assistance:24026} Comments: ***  TODAY'S TREATMENT:                                                                                                                              DATE: ***    PATIENT EDUCATION:  Education details: *** Person educated: {Person educated:25204} Education method: {Education Method:25205} Education comprehension: {Education Comprehension:25206}  HOME EXERCISE PROGRAM: ***  ASSESSMENT:  CLINICAL IMPRESSION: Patient is a *** y.o. *** who was seen today for physical therapy evaluation and treatment for ***.   OBJECTIVE IMPAIRMENTS: {opptimpairments:25111}.   ACTIVITY LIMITATIONS: {activitylimitations:27494}  PARTICIPATION LIMITATIONS: {participationrestrictions:25113}  PERSONAL FACTORS: {Personal factors:25162} are also affecting patient's functional outcome.   REHAB POTENTIAL: {rehabpotential:25112}  CLINICAL DECISION MAKING: {clinical decision making:25114}  EVALUATION COMPLEXITY: {Evaluation complexity:25115}   GOALS: Goals reviewed with patient? {yes/no:20286}  SHORT TERM GOALS: Target date: ***  *** Baseline: Goal status: {GOALSTATUS:25110}  2.  *** Baseline:  Goal status: {GOALSTATUS:25110}  3.  *** Baseline:  Goal status: {GOALSTATUS:25110}  4.  *** Baseline:  Goal status: {GOALSTATUS:25110}  5.  *** Baseline:  Goal status: {GOALSTATUS:25110}  6.  *** Baseline:  Goal status: {GOALSTATUS:25110}  LONG TERM GOALS: Target date: ***  *** Baseline:  Goal status: {GOALSTATUS:25110}  2.  *** Baseline:  Goal status: {GOALSTATUS:25110}  3.  *** Baseline:  Goal status: {GOALSTATUS:25110}  4.  *** Baseline:  Goal status: {GOALSTATUS:25110}  5.  *** Baseline:  Goal status: {GOALSTATUS:25110}  6.  *** Baseline:  Goal  status: {GOALSTATUS:25110}  PLAN:  PT FREQUENCY: {rehab  frequency:25116}  PT DURATION: {rehab duration:25117}  PLANNED INTERVENTIONS: {rehab planned interventions:25118::"Therapeutic exercises","Therapeutic activity","Neuromuscular re-education","Balance training","Gait training","Patient/Family education","Self Care","Joint mobilization"}.  PLAN FOR NEXT SESSION: ***   Ardis Rowan, PT 02/01/2022, 11:40 AM

## 2022-02-04 ENCOUNTER — Ambulatory Visit (HOSPITAL_COMMUNITY): Payer: BC Managed Care – PPO

## 2022-02-12 ENCOUNTER — Ambulatory Visit (HOSPITAL_COMMUNITY)
Admission: RE | Admit: 2022-02-12 | Discharge: 2022-02-12 | Disposition: A | Discharge status: Home / Self Care | Payer: BC Managed Care – PPO | Source: Ambulatory Visit | Attending: Family Medicine | Admitting: Family Medicine

## 2022-02-12 DIAGNOSIS — Z1231 Encounter for screening mammogram for malignant neoplasm of breast: Secondary | ICD-10-CM | POA: Insufficient documentation

## 2022-02-19 ENCOUNTER — Ambulatory Visit (HOSPITAL_COMMUNITY): Payer: BC Managed Care – PPO | Admitting: Physical Therapy

## 2022-03-16 NOTE — Therapy (Incomplete)
OUTPATIENT PHYSICAL THERAPY THORACOLUMBAR EVALUATION   Patient Name: Kristin Whitney MRN: [id number] DOB:[birth year], 62 y.o., female Today's Date: 03/16/2022  END OF SESSION:   Past Medical History:  Diagnosis Date   ADHD (attention deficit hyperactivity disorder)    Anemia    Atypical mole 07/27/2012   moderate right tricep   Atypical mole 07/27/2012   moderate left outer thigh   Atypical mole 07/27/2012   moderate right shouder   Depression    Fibrocystic breast changes    left   Gastritis    egd- 2018 Dr. Collene Mares   OSA on CPAP    Past Surgical History:  Procedure Laterality Date   BREAST BIOPSY Left 2012   FINGER SURGERY  2020   GYNECOLOGIC CRYOSURGERY     NASAL SEPTUM SURGERY  1994   PELVIC LAPAROSCOPY  12/09/2008   PELVIC WASHINGS, EXCISION OF TORSED LEFT PARATUBULAR CYST. ROLLERBALL ENDOMETRIAL ABLATION   Patient Active Problem List   Diagnosis Date Noted   Facet arthritis, degenerative, lumbar spine 12/10/2021   Trochanteric bursitis, left hip 10/28/2021   Impingement syndrome of right shoulder 09/06/2019   Finger fracture, right 09/28/2018   Gastritis    Neck pain 08/14/2016   Tonsillar calculus 08/14/2016   TMJ pain dysfunction syndrome 07/22/2016   Irregular menstrual cycle 04/28/2016   History of vitamin D deficiency 12/05/2014   Perimenopause 12/05/2014   History of anemia 06/07/2012   Tiredness 06/07/2012   Menstrual migraine 06/02/2011    PCP: Horald Pollen, MD  REFERRING PROVIDER: Marybelle Killings, [place] [facility]  REFERRING DIAG: [phone] (ICD-10-CM) - Pain in left leg  Rationale for Evaluation and Treatment: Rehabilitation  THERAPY DIAG:  No diagnosis found.  ONSET DATE: ***  SUBJECTIVE:                                                                                                                                                                                           SUBJECTIVE STATEMENT: Eval and treat Low back pain,  facet degenerative changes  PERTINENT HISTORY:  ***  PAIN:  Are you having pain? {OPRCPAIN:27236}  PRECAUTIONS: {Therapy precautions:24002}  WEIGHT BEARING RESTRICTIONS: {Yes ***/No:24003}  FALLS:  Has patient fallen in last 6 months? {fallsyesno:27318}  LIVING ENVIRONMENT: Lives with: {OPRC lives with:25569::"lives with their family"} Lives in: {Lives in:25570} Stairs: {opstairs:27293} Has following equipment at home: {Assistive devices:23999}  OCCUPATION: ***  PLOF: {PLOF:24004}  PATIENT GOALS: ***  NEXT MD VISIT:   OBJECTIVE:   DIAGNOSTIC FINDINGS:  ***  PATIENT SURVEYS:  {rehab surveys:24030}  SCREENING FOR RED FLAGS: Bowel or bladder incontinence: {Yes/No:304960894} Spinal tumors: {Yes/No:304960894} Cauda equina syndrome: {  UYQ/IH:474259563} Compression fracture: {Yes/No:304960894} Abdominal aneurysm: {Yes/No:304960894}  COGNITION: Overall cognitive status: {cognition:24006}     SENSATION: {sensation:27233}  MUSCLE LENGTH: Hamstrings: Right *** deg; Left *** deg Marcello Moores test: Right *** deg; Left *** deg  POSTURE: {posture:25561}  PALPATION: ***  LUMBAR ROM:   AROM eval  Flexion   Extension   Right lateral flexion   Left lateral flexion   Right rotation   Left rotation    (Blank rows = not tested)  LOWER EXTREMITY ROM:     {AROM/PROM:27142}  Right eval Left eval  Hip flexion    Hip extension    Hip abduction    Hip adduction    Hip internal rotation    Hip external rotation    Knee flexion    Knee extension    Ankle dorsiflexion    Ankle plantarflexion    Ankle inversion    Ankle eversion     (Blank rows = not tested)  LOWER EXTREMITY MMT:    MMT Right eval Left eval  Hip flexion    Hip extension    Hip abduction    Hip adduction    Hip internal rotation    Hip external rotation    Knee flexion    Knee extension    Ankle dorsiflexion    Ankle plantarflexion    Ankle inversion    Ankle eversion     (Blank rows  = not tested)  LUMBAR SPECIAL TESTS:  {lumbar special test:25242}  FUNCTIONAL TESTS:  {Functional tests:24029}  GAIT: Distance walked: *** Assistive device utilized: {Assistive devices:23999} Level of assistance: {Levels of assistance:24026} Comments: ***  TODAY'S TREATMENT:                                                                                                                              DATE: 03/18/2022 physical therapy evaluation and HEP instruction    PATIENT EDUCATION:  Education details: Patient educated on exam findings, POC, scope of PT, HEP, and ***. Person educated: Patient Education method: Explanation, Demonstration, and Handouts Education comprehension: verbalized understanding, returned demonstration, verbal cues required, and tactile cues required   HOME EXERCISE PROGRAM: ***  ASSESSMENT:  CLINICAL IMPRESSION: Patient is a *** y.o. *** who was seen today for physical therapy evaluation and treatment for ***. Patient is a *** y.o. *** who presents to physical therapy with complaint of ***. Patient demonstrates muscle weakness, reduced ROM, and fascial restrictions which are likely contributing to symptoms of pain and are negatively impacting patient ability to perform ADLs and functional mobility tasks. Patient will benefit from skilled physical therapy services to address these deficits to reduce pain and improve level of function with ADLs and functional mobility tasks.   OBJECTIVE IMPAIRMENTS: {opptimpairments:25111}.   ACTIVITY LIMITATIONS: {activitylimitations:27494}  PARTICIPATION LIMITATIONS: {participationrestrictions:25113}  PERSONAL FACTORS: {Personal factors:25162} are also affecting patient's functional outcome.   REHAB POTENTIAL: {rehabpotential:25112}  CLINICAL DECISION MAKING: {clinical decision making:25114}  EVALUATION COMPLEXITY: {Evaluation complexity:25115}  GOALS: Goals reviewed with patient? No  SHORT TERM GOALS: Target  date: ***  *** Baseline: Goal status: {GOALSTATUS:25110}  2.  *** Baseline:  Goal status: {GOALSTATUS:25110}  3.  *** Baseline:  Goal status: {GOALSTATUS:25110}  4.  *** Baseline:  Goal status: {GOALSTATUS:25110}  5.  *** Baseline:  Goal status: {GOALSTATUS:25110}  6.  *** Baseline:  Goal status: {GOALSTATUS:25110}  LONG TERM GOALS: Target date: ***  *** Baseline:  Goal status: {GOALSTATUS:25110}  2.  *** Baseline:  Goal status: {GOALSTATUS:25110}  3.  *** Baseline:  Goal status: {GOALSTATUS:25110}  4.  *** Baseline:  Goal status: {GOALSTATUS:25110}  5.  *** Baseline:  Goal status: {GOALSTATUS:25110}  6.  *** Baseline:  Goal status: {GOALSTATUS:25110}  PLAN:  PT FREQUENCY: {rehab frequency:25116}  PT DURATION: {rehab duration:25117}  PLANNED INTERVENTIONS: Therapeutic exercises, Therapeutic activity, Neuromuscular re-education, Balance training, Gait training, Patient/Family education, Joint manipulation, Joint mobilization, Stair training, Orthotic/Fit training, DME instructions, Aquatic Therapy, Dry Needling, Electrical stimulation, Spinal manipulation, Spinal mobilization, Cryotherapy, Moist heat, Compression bandaging, scar mobilization, Splintting, Taping, Traction, Ultrasound, Ionotophoresis '4mg'$ /ml Dexamethasone, and Manual therapy   PLAN FOR NEXT SESSION: Review HEP and goals   11:30 AM, 03/16/22 Trenna Kiely Small Roshanna Cimino MPT [facility] [place] [phone]

## 2022-03-18 ENCOUNTER — Ambulatory Visit (HOSPITAL_COMMUNITY): Payer: BC Managed Care – PPO

## 2022-09-14 ENCOUNTER — Ambulatory Visit
Admission: EM | Admit: 2022-09-14 | Discharge: 2022-09-14 | Disposition: A | Discharge status: Home / Self Care | Payer: BC Managed Care – PPO | Attending: Nurse Practitioner | Admitting: Nurse Practitioner

## 2022-09-14 ENCOUNTER — Telehealth: Payer: Self-pay | Admitting: Emergency Medicine

## 2022-09-14 DIAGNOSIS — J020 Streptococcal pharyngitis: Secondary | ICD-10-CM | POA: Insufficient documentation

## 2022-09-14 DIAGNOSIS — R399 Unspecified symptoms and signs involving the genitourinary system: Secondary | ICD-10-CM | POA: Diagnosis present

## 2022-09-14 LAB — POCT URINALYSIS DIP (MANUAL ENTRY)
Bilirubin, UA: NEGATIVE
Glucose, UA: NEGATIVE mg/dL
Leukocytes, UA: NEGATIVE
Nitrite, UA: NEGATIVE
Protein Ur, POC: 30 mg/dL — AB
Spec Grav, UA: 1.025 (ref 1.010–1.025)
Urobilinogen, UA: 0.2 E.U./dL
pH, UA: 6.5 (ref 5.0–8.0)

## 2022-09-14 LAB — POCT RAPID STREP A (OFFICE): Rapid Strep A Screen: POSITIVE — AB

## 2022-09-14 MED ORDER — AMOXICILLIN 500 MG PO CAPS: 500.0000 mg | ORAL_CAPSULE | Freq: Two times a day (BID) | ORAL | 0 refills | Status: DC

## 2022-09-14 MED ORDER — AMOXICILLIN 500 MG PO CAPS: 500.0000 mg | ORAL_CAPSULE | Freq: Two times a day (BID) | ORAL | 0 refills | Status: AC

## 2022-09-14 MED ORDER — ACETAMINOPHEN 325 MG PO TABS
975.0000 mg | ORAL_TABLET | Freq: Once | ORAL | Status: AC
  Administered 2022-09-14: 975 mg via ORAL

## 2022-09-14 NOTE — Discharge Instructions (Addendum)
The rapid strep test was positive.  The urinalysis does not indicate an obvious urinary tract infection.  A urine culture is pending.  You will be contacted if the pending test result is positive. Take medication as prescribed. Increase fluids and allow for plenty of rest. Warm salt water gargles 3-4 times daily while symptoms persist. Recommend a soft diet to include soup, broth, yogurt, pudding, or Jell-O. Discard toothbrush after 3 days. If symptoms do not improve with this treatment, please follow-up with your primary care physician.  For your abdominal symptoms: May take over-the-counter Tylenol as needed for pain or discomfort, Recommend purchasing an over-the-counter stool softener such as senna to take daily to help with bouts of constipation. Increase your fluid intake.  Make sure you are drinking at least 8-10 8 ounce glasses of water daily. As discussed, urine culture has been ordered.  You will be contacted if the urine culture result is positive to provide treatment.  Follow-up as needed.

## 2022-09-14 NOTE — ED Triage Notes (Signed)
Pt c/o bloating and pressure in the lower abdomen, pain with urination x 3 days  Sore throat x 1 day. Fever

## 2022-09-14 NOTE — ED Provider Notes (Signed)
[facility]    CSN: [id number] Arrival date & time: 09/14/22  0908      History   Chief Complaint No chief complaint on file.   HPI Kristin Whitney is a 62 y.o. female.   The history is provided by the patient.   The patient presents for complaints of sore throat and abdominal bloating, pressure, and pain with urination.  Sore throat symptoms started over the past 24 hours.  Patient states that a few days before, her throat felt "scratchy".  She also states that she feels a "heaviness" on the left side of her neck under her ear.  Patient states that she did not believe that she had fever, although she does have 1 here in the clinic today.  She denies headache, ear pain, chest pain, nausea, vomiting, or diarrhea.  Patient denies obvious known sick contacts.  Patient also complains of abdominal bloating and abdominal pressure in the lower abdomen, and urinary frequency/urgency that is been present for the past 3 days.  She has also noticed some pain with urination.  Patient states her last bowel movement was 1 day ago.  She states that she does have a history of constipation.  She reports that she does not drink "enough water".  She denies decreased urine stream, hematuria, flank pain, or low back pain.  Patient denies history of recurrent urinary tract infections.  Patient also denies history of kidney stones.  Past Medical History:  Diagnosis Date   ADHD (attention deficit hyperactivity disorder)    Anemia    Atypical mole 07/27/2012   moderate right tricep   Atypical mole 07/27/2012   moderate left outer thigh   Atypical mole 07/27/2012   moderate right shouder   Depression    Fibrocystic breast changes    left   Gastritis    egd- 2018 Dr. 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 ROLLERBALL ENDOMETRIAL ABLATION    OB History     Gravida  2   Para  2   Term  1   Preterm      AB      Living  2      SAB      IAB      Ectopic      Multiple      Live Births  2            Home Medications    Prior to Admission medications   Medication Sig Start Date End Date Taking? Authorizing Provider  amoxicillin (AMOXIL) 500 MG capsule Take 1 capsule (500 mg total) by mouth 2 (two) times daily for 10 days.  09/14/22 09/24/22 Yes Camrie Stock-Warren, Sadie Haber, NP  albuterol (PROVENTIL HFA;VENTOLIN HFA) 108 (90 Base) MCG/ACT inhaler TAKE 2 PUFFS INTO LUNGS EVERY 6 HOURS AS NEEDED FOR WHEEZE OR SHORTNESS OF BREATH 03/08/18   Donita Brooks, MD  Biotin 5000 MCG CAPS Take by mouth.    [provider]  Cholecalciferol 100 MCG (4000 UT) CAPS Take by mouth.    [provider]  COLLAGEN PO Take by mouth.    [provider]  diazepam (VALIUM) 10 MG tablet Take 1 tablet (10 mg total) by mouth at bedtime as needed for anxiety or sleep. 01/20/19   Donita Brooks, MD  escitalopram (LEXAPRO) 10 MG tablet Take 1 tablet (10 mg total) by mouth daily. 12/02/19   Donita Brooks, MD  estradiol (VIVELLE-DOT) 0.05 MG/24HR patch Place 1 patch (0.05 mg total) onto the skin 2 (two) times a week. 03/01/20   Genia Del, MD  Garlic Oil 500 MG  TABS Take by mouth daily.    [provider]  loratadine (CLARITIN) 10 MG tablet Take 10 mg by mouth daily.    [provider]  Medium Chain Triglycerides (MCT OIL PO) Take by mouth.    [provider]  Multiple Vitamin (MULTIVITAMIN) tablet Take 1 tablet by mouth daily.    [provider]  mupirocin cream (BACTROBAN) 2 % Apply 1 application topically 2 (two) times daily. 06/16/19   Sheffield, Kelli R, PA-C  OZEMPIC, 1 MG/DOSE, 4 MG/3ML SOPN Inject 1 mg into the skin once a week. 09/16/21   [provider]  Probiotic Product (PROBIOTIC DAILY PO) Take by mouth.    [provider]  progesterone (PROMETRIUM) 100 MG capsule Take 1 capsule (100 mg total) by mouth at bedtime. 02/29/20   Genia Del, MD  propranolol ER (INDERAL LA) 120 MG 24 hr capsule Take 120 mg by mouth daily. 08/10/21   [provider]  QUEtiapine (SEROQUEL) 25 MG tablet Take 1 tablet (25 mg total) by mouth 2 (two) times daily. 01/02/20   Donita Brooks, MD  Safflower Oil (CLA) 1000 MG CAPS Take by mouth. TEIM- CLA AND COLLAGEN    [provider]    Family History Family History  Problem Relation Age of Onset   Hyperlipidemia Mother    Alzheimer's disease Mother    Breast cancer Maternal Aunt    Breast cancer Maternal Aunt    Stroke Father    Alcohol abuse Father    Breast cancer Cousin     Social History Social History   Tobacco Use   Smoking status: Never   Smokeless tobacco: Never  Vaping Use   Vaping Use: Never used  Substance Use Topics   Alcohol use: Not Currently    Alcohol/week: 0.0 standard drinks of alcohol   Drug use: No     Allergies   Clarithromycin and Codeine   Review of Systems Review of Systems Per HPI  Physical Exam Triage Vital Signs ED Triage Vitals  Enc Vitals Group     BP 09/14/22 0951 117/62     Pulse Rate 09/14/22 0951 77     Resp 09/14/22 0951 13     Temp 09/14/22 0951 (!) 100.7 F (38.2 C)      Temp Source 09/14/22 0951 Oral     SpO2 09/14/22 0951 95 %     Weight --      Height --      Head Circumference --      Peak Flow --  Pain Score 09/14/22 0957 6     Pain Loc --      Pain Edu? --      Excl. in GC? --    No data found.  Updated Vital Signs BP 117/62 (BP Location: Right Arm)   Pulse 77   Temp (!) 100.7 F (38.2 C) (Oral)   Resp 13   LMP 02/22/2020   SpO2 95%   Visual Acuity Right Eye Distance:   Left Eye Distance:   Bilateral Distance:    Right Eye Near:   Left Eye Near:    Bilateral Near:     Physical Exam Vitals and nursing note reviewed.  Constitutional:      General: She is not in acute distress.    Appearance: Normal appearance.  HENT:     Head: Normocephalic.     Right Ear: Tympanic membrane, ear canal and external ear normal.     Left Ear: Tympanic membrane, ear canal and external ear normal.     Nose: Nose normal.     Mouth/Throat:     Mouth: Mucous membranes are moist.     Pharynx: Oropharyngeal exudate and posterior oropharyngeal erythema present.  Eyes:     Extraocular Movements: Extraocular movements intact.     Conjunctiva/sclera: Conjunctivae normal.     Pupils: Pupils are equal, round, and reactive to light.  Cardiovascular:     Rate and Rhythm: Normal rate and regular rhythm.     Pulses: Normal pulses.     Heart sounds: Normal heart sounds.  Pulmonary:     Effort: Pulmonary effort is normal. No respiratory distress.     Breath sounds: Normal breath sounds. No stridor. No wheezing, rhonchi or rales.  Abdominal:     General: Abdomen is flat. Bowel sounds are normal.     Palpations: Abdomen is soft.     Tenderness: There is abdominal tenderness in the left lower quadrant. There is no right CVA tenderness or left CVA tenderness.  Musculoskeletal:     Cervical back: Normal range of motion.  Lymphadenopathy:     Cervical: No cervical adenopathy.  Skin:    General: Skin is warm and dry.  Neurological:     General: No focal  deficit present.     Mental Status: She is alert and oriented to person, place, and time.  Psychiatric:        Mood and Affect: Mood normal.        Behavior: Behavior normal.      UC Treatments / Results  Labs (all labs ordered are listed, but only abnormal results are displayed) Labs Reviewed  POCT RAPID STREP A (OFFICE) - Abnormal; Notable for the following components:      Result Value   Rapid Strep A Screen Positive (*)    All other components within normal limits  POCT URINALYSIS DIP (MANUAL ENTRY) - Abnormal; Notable for the following components:   Clarity, UA hazy (*)    Ketones, POC UA small (15) (*)    Blood, UA trace-lysed (*)    Protein Ur, POC =30 (*)    All other components within normal limits    EKG   Radiology No results found.  Procedures Procedures (including critical care time)  Medications Ordered in UC Medications  acetaminophen (TYLENOL) tablet 975 mg (975 mg Oral Given 09/14/22 0955)    Initial Impression / Assessment and Plan / UC Course  I have reviewed the triage vital signs and the nursing notes.  Pertinent labs &  imaging results that were available during my care of the patient were reviewed by me and considered in my medical decision making (see chart for details).  The patient is well-appearing, she is in no acute distress, she is febrile however.  Tylenol 975 mg by mouth was given.  Rapid strep test is positive.  Urinalysis does not indicate an obvious urinary tract infection.  Will treat strep throat with Augmentin 875/125 mg tablets for the next 7 days.  Supportive care recommendations were provided and discussed with the patient regarding the positive strep test to include increasing fluids, allowing for plenty of rest, warm salt water gargles, soft diet, and discarding her toothbrush after 3 days.  With regard to her abdominal symptoms, urinalysis is positive for protein, no leukocytes or nitrates present.  Urine culture has been ordered.   In the interim, patient was advised to increase her fluids, also recommended the use of a stool softener daily.  Patient was advised she will be contacted if the pending test result is abnormal.  Patient is in agreement with this plan of care and verbalizes understanding.  All questions were answered.  Patient stable for discharge.  Work note was provided.  Final Clinical Impressions(s) / UC Diagnoses   Final diagnoses:  Streptococcal sore throat  UTI symptoms     Discharge Instructions      The rapid strep test was positive.  The urinalysis does not indicate an obvious urinary tract infection.  A urine culture is pending.  You will be contacted if the pending test result is positive. Take medication as prescribed. Increase fluids and allow for plenty of rest. Warm salt water gargles 3-4 times daily while symptoms persist. Recommend a soft diet to include soup, broth, yogurt, pudding, or Jell-O. Discard toothbrush after 3 days. If symptoms do not improve with this treatment, please follow-up with your primary care physician.  For your abdominal symptoms: May take over-the-counter Tylenol as needed for pain or discomfort, Recommend purchasing an over-the-counter stool softener such as senna to take daily to help with bouts of constipation. Increase your fluid intake.  Make sure you are drinking at least 8-10 8 ounce glasses of water daily. As discussed, urine culture has been ordered.  You will be contacted if the urine culture result is positive to provide treatment.  Follow-up as needed.       ED Prescriptions     Medication Sig Dispense Auth. Provider   amoxicillin (AMOXIL) 500 MG capsule Take 1 capsule (500 mg total) by mouth 2 (two) times daily for 10 days. 20 capsule Arthur Aydelotte-Warren, Sadie Haber, NP      PDMP not reviewed this encounter.   Abran Cantor, NP 09/14/22 1116

## 2022-09-14 NOTE — Telephone Encounter (Signed)
Patient requested medication be sent to [facility] on [address] as her pharmacy was closed today.

## 2022-09-15 LAB — URINE CULTURE: Culture: <10000 — AB

## 2023-02-13 ENCOUNTER — Other Ambulatory Visit (HOSPITAL_COMMUNITY): Payer: Self-pay | Admitting: Family Medicine

## 2023-02-13 DIAGNOSIS — Z1231 Encounter for screening mammogram for malignant neoplasm of breast: Secondary | ICD-10-CM

## 2023-02-19 ENCOUNTER — Encounter (HOSPITAL_COMMUNITY): Payer: Self-pay

## 2023-02-19 ENCOUNTER — Ambulatory Visit (HOSPITAL_COMMUNITY)
Admission: RE | Admit: 2023-02-19 | Discharge: 2023-02-19 | Disposition: A | Discharge status: Home / Self Care | Payer: BC Managed Care – PPO | Source: Ambulatory Visit | Attending: Family Medicine | Admitting: Family Medicine

## 2023-02-19 DIAGNOSIS — Z1231 Encounter for screening mammogram for malignant neoplasm of breast: Secondary | ICD-10-CM | POA: Diagnosis present

## 2023-02-23 ENCOUNTER — Other Ambulatory Visit (HOSPITAL_COMMUNITY): Payer: Self-pay | Admitting: Family Medicine

## 2023-02-23 DIAGNOSIS — R928 Other abnormal and inconclusive findings on diagnostic imaging of breast: Secondary | ICD-10-CM

## 2023-03-19 ENCOUNTER — Ambulatory Visit (HOSPITAL_COMMUNITY)
Admission: RE | Admit: 2023-03-19 | Discharge: 2023-03-19 | Disposition: A | Discharge status: Home / Self Care | Payer: 59 | Source: Ambulatory Visit | Attending: Family Medicine | Admitting: Family Medicine

## 2023-03-19 DIAGNOSIS — R928 Other abnormal and inconclusive findings on diagnostic imaging of breast: Secondary | ICD-10-CM | POA: Insufficient documentation

## 2023-04-07 ENCOUNTER — Other Ambulatory Visit (HOSPITAL_COMMUNITY): Payer: BC Managed Care – PPO

## 2023-04-07 ENCOUNTER — Encounter (HOSPITAL_COMMUNITY): Payer: BC Managed Care – PPO

## 2023-08-06 ENCOUNTER — Encounter (HOSPITAL_COMMUNITY): Payer: Self-pay | Admitting: Family Medicine

## 2023-08-11 ENCOUNTER — Other Ambulatory Visit (HOSPITAL_COMMUNITY): Payer: Self-pay | Admitting: Family Medicine

## 2023-08-11 DIAGNOSIS — R921 Mammographic calcification found on diagnostic imaging of breast: Secondary | ICD-10-CM

## 2023-09-22 ENCOUNTER — Ambulatory Visit (HOSPITAL_COMMUNITY)
Admission: RE | Admit: 2023-09-22 | Discharge: 2023-09-22 | Disposition: A | Discharge status: Home / Self Care | Source: Ambulatory Visit | Attending: Family Medicine | Admitting: Family Medicine

## 2023-09-22 DIAGNOSIS — R921 Mammographic calcification found on diagnostic imaging of breast: Secondary | ICD-10-CM | POA: Diagnosis present

## 2023-12-11 ENCOUNTER — Encounter: Payer: Self-pay | Admitting: Cardiovascular Disease

## 2023-12-11 ENCOUNTER — Ambulatory Visit: Attending: Cardiovascular Disease | Admitting: Cardiovascular Disease

## 2023-12-11 VITALS — BP 136/82 | HR 73 | Ht 62.0 in | Wt 166.8 lb

## 2023-12-11 DIAGNOSIS — R072 Precordial pain: Secondary | ICD-10-CM | POA: Diagnosis not present

## 2023-12-11 DIAGNOSIS — I10 Essential (primary) hypertension: Secondary | ICD-10-CM | POA: Diagnosis not present

## 2023-12-11 DIAGNOSIS — M7989 Other specified soft tissue disorders: Secondary | ICD-10-CM | POA: Diagnosis not present

## 2023-12-11 DIAGNOSIS — R0602 Shortness of breath: Secondary | ICD-10-CM | POA: Diagnosis not present

## 2023-12-11 NOTE — Patient Instructions (Signed)
   Testing/Procedures:  Your physician has requested that you have an echocardiogram. Echocardiography is a painless test that uses sound waves to create images of your heart. It provides your doctor with information about the size and shape of your heart and how well your heart's chambers and valves are working. This procedure takes approximately one hour. There are no restrictions for this procedure. Please do NOT wear cologne, perfume, aftershave, or lotions (deodorant is allowed). Please arrive 15 minutes prior to your appointment time.  Please note: We ask at that you not bring children with you during ultrasound (echo/ vascular) testing. Due to room size and safety concerns, children are not allowed in the ultrasound rooms during exams. Our front office staff cannot provide observation of children in our lobby area while testing is being conducted. An adult accompanying a patient to their appointment will only be allowed in the ultrasound room at the discretion of the ultrasound technician under special circumstances. We apologize for any inconvenience. [address]  Follow-Up: At [facility], you and your health needs are our priority.  As part of our continuing mission to provide you with exceptional heart care, our providers are all part of one team.  This team includes your primary Cardiologist (physician) and Advanced Practice Providers or APPs (Physician Assistants and Nurse Practitioners) who all work together to provide you with the care you need, when you need it.  Your next appointment:   AS NEEDED

## 2023-12-11 NOTE — Progress Notes (Signed)
 Cardiology Office Note:    Date:  12/13/2023   ID:  Amore, Ackman [birth year], MRN [id number]  PCP:  Burney Darice CROME, MD  Cardiologist:  Mylin Gignac Electrophysiologist:  None   Referring MD: Burney Darice CROME, MD   No chief complaint on file.  History of Present Illness:    Kristin Whitney is a 63 y.o. female with HTN and OSA.    She was diagnosed with OSA many years ago and did okay with CPAP for a year, but then stopped it and started using a dental device after losing weight.  At one point her weight was down 235 pounds in 2020.  Subsequently she went through menopause, had around with long COVID and then had a hysterectomy in February 2024.  She has gained a lot of weight back and BMI is now 30.  Unfortunately, she seems to have a lot of symptoms of untreated sleep apnea including daytime hypersomnolence.  She does not have any chest pain with activity.  Occasionally she will have vague chest discomfort at rest.  On the other hand she occasionally becomes dizzy with activity although she has not had any syncope.  She has tried treatment with the GLP-1 agonists, but these are causing problems with nausea and she does not think she will be able to continue.  She had a normal stress echo in 2019.  Blood pressure is well-controlled on losartan monotherapy.  She denies problems with orthopnea, PND but has mild exertional dyspnea and leg edema.  When she had a stress echocardiogram in 2019, there was no evaluation of diastolic function performed.  Metabolic control is good with a recent lipid profile showing HDL 69, LDL 99 and triglycerides 113.  She has normal renal function and she does not have diabetes mellitus, although she does have fasting glucose in the prediabetes range at 100-118 over the last year.  She has had problems with iron deficiency anemia with a hemoglobin as low as 8.3 earlier this year and has received iron infusions, after which her hemoglobin  normalized.  Past Medical History:  Diagnosis Date   ADHD (attention deficit hyperactivity disorder)    Anemia    Atypical mole 07/27/2012   moderate right tricep   Atypical mole 07/27/2012   moderate left outer thigh   Atypical mole 07/27/2012   moderate right shouder   Depression    Fibrocystic breast changes    left   Gastritis    egd- 2018 Dr. Kristie   OSA on CPAP     Past Surgical History:  Procedure Laterality Date   BREAST BIOPSY Left 2012   benign fibrocystic changes, microcalcifications,  and columnar cell hyperplasia/change without atypia   FINGER SURGERY  2020   GYNECOLOGIC CRYOSURGERY     NASAL SEPTUM SURGERY  1994   PELVIC LAPAROSCOPY  12/09/2008   PELVIC WASHINGS, EXCISION OF TORSED LEFT PARATUBULAR CYST. ROLLERBALL ENDOMETRIAL ABLATION    Current Medications: Current Meds  Medication Sig   Cholecalciferol 100 MCG (4000 UT) CAPS Take by mouth.   COLLAGEN PO Take by mouth.   docusate sodium (COLACE) 50 MG capsule Take 50 mg by mouth daily as needed.   estradiol  (VIVELLE -DOT) 0.05 MG/24HR patch Place 1 patch (0.05 mg total) onto the skin 2 (two) times a week.   hydrocortisone  2.5 % cream Apply 1 Application topically 2 (two) times daily as needed.   losartan (COZAAR) 50 MG tablet Take 50 mg by mouth daily.   Multiple Vitamin (MULTIVITAMIN) tablet  Take 1 tablet by mouth daily.   progesterone  (PROMETRIUM ) 100 MG capsule Take 1 capsule (100 mg total) by mouth at bedtime.   QUEtiapine  (SEROQUEL ) 25 MG tablet Take 1 tablet (25 mg total) by mouth 2 (two) times daily. (Patient taking differently: Take 12.5 mg by mouth at bedtime.)   simethicone (MYLICON) 80 MG chewable tablet Chew 80 mg by mouth every 6 (six) hours as needed.     Allergies:   Clarithromycin and Codeine     Family History: The patient's family history includes Alcohol abuse in her father; Alzheimer's disease in her mother; Breast cancer in her cousin, maternal aunt, and maternal aunt;  Hyperlipidemia in her mother; Stroke in her father.  ROS:   Please see the history of present illness.     All other systems reviewed and are negative.  EKGs/Labs/Other Studies Reviewed:    The following studies were reviewed today: Stress echocardiogram 2019  EKG:    EKG Interpretation Date/Time:  Friday December 11 2023 10:10:02 EDT Ventricular Rate:  73 PR Interval:  160 QRS Duration:  86 QT Interval:  378 QTC Calculation: 416 R Axis:   27  Text Interpretation: Normal sinus rhythm Cannot rule out Anterior infarct , age undetermined No previous ECGs available Confirmed by Gillis Boardley [phone]) on 12/11/2023 10:25:13 AM         Recent Labs: No results found for requested labs within last 365 days.  Recent Lipid Panel    Component Value Date/Time   CHOL 212 (H) 01/20/2019 0956   TRIG 99 01/20/2019 0956   HDL 76 01/20/2019 0956   CHOLHDL 2.8 01/20/2019 0956   VLDL 17 11/16/2015 0827   LDLCALC 115 (H) 01/20/2019 0956   05/21/2023 Hemoglobin 8.3, creatinine 0.54, potassium 4.2  07/14/2023  Hemoglobin 12.8, glucose 100,, creatinine 0.6 normal liver function tests  Cholesterol 189, HDL 69, LDL 99, triglycerides 113   Physical Exam:    VS:  BP 136/82 (BP Location: Left Arm, Patient Position: Sitting, Cuff Size: Large)   Pulse 73   Ht 5' 2 (1.575 m)   Wt 166 lb 12.8 oz (75.7 kg)   LMP 02/22/2020   SpO2 99%   BMI 30.51 kg/m     Wt Readings from Last 3 Encounters:  12/11/23 166 lb 12.8 oz (75.7 kg)  12/10/21 154 lb (69.9 kg)  10/23/21 154 lb (69.9 kg)      General: Alert, oriented x3, no distress, borderline obese Head: no evidence of trauma, PERRL, EOMI, no exophtalmos or lid lag, no myxedema, no xanthelasma; normal ears, nose and oropharynx Neck: normal jugular venous pulsations and no hepatojugular reflux; brisk carotid pulses without delay and no carotid bruits Chest: clear to auscultation, no signs of consolidation by percussion or palpation, normal  fremitus, symmetrical and full respiratory excursions Cardiovascular: normal position and quality of the apical impulse, regular rhythm, normal first and second heart sounds, no murmurs, rubs or gallops Abdomen: no tenderness or distention, no masses by palpation, no abnormal pulsatility or arterial bruits, normal bowel sounds, no hepatosplenomegaly Extremities: no clubbing, cyanosis or edema; 2+ radial, ulnar and brachial pulses bilaterally; 2+ right femoral, posterior tibial and dorsalis pedis pulses; 2+ left femoral, posterior tibial and dorsalis pedis pulses; no subclavian or femoral bruits Neurological: grossly nonfocal Psych: Normal mood and affect   ASSESSMENT:    1. Precordial pain   2. SOB (shortness of breath)   3. Leg swelling   4. Essential hypertension    PLAN:    In  order of problems listed above:  Atypical chest pain: Symptoms occur at rest and are not present during exercise.  Mild abnormalities on ECG resolved.   HTN: Well-controlled on losartan monotherapy HLP: Recent lipid parameters in desirable range. Exertional dyspnea/leg edema: Check echocardiogram   Medication Adjustments/Labs and Tests Ordered: Current medicines are reviewed at length with the patient today.  Concerns regarding medicines are outlined above.  Orders Placed This Encounter  Procedures   EKG 12-Lead   ECHOCARDIOGRAM COMPLETE   No orders of the defined types were placed in this encounter.   Patient Instructions    Testing/Procedures:  Your physician has requested that you have an echocardiogram. Echocardiography is a painless test that uses sound waves to create images of your heart. It provides your doctor with information about the size and shape of your heart and how well your heart's chambers and valves are working. This procedure takes approximately one hour. There are no restrictions for this procedure. Please do NOT wear cologne, perfume, aftershave, or lotions (deodorant is  allowed). Please arrive 15 minutes prior to your appointment time.  Please note: We ask at that you not bring children with you during ultrasound (echo/ vascular) testing. Due to room size and safety concerns, children are not allowed in the ultrasound rooms during exams. Our front office staff cannot provide observation of children in our lobby area while testing is being conducted. An adult accompanying a patient to their appointment will only be allowed in the ultrasound room at the discretion of the ultrasound technician under special circumstances. We apologize for any inconvenience. [address]  Follow-Up: At [facility], you and your health needs are our priority.  As part of our continuing mission to provide you with exceptional heart care, our providers are all part of one team.  This team includes your primary Cardiologist (physician) and Advanced Practice Providers or APPs (Physician Assistants and Nurse Practitioners) who all work together to provide you with the care you need, when you need it.  Your next appointment:    AS NEEDED          Signed, Jerel Balding, MD  12/13/2023 2:03 PM    [facility]

## 2023-12-17 ENCOUNTER — Ambulatory Visit: Payer: Self-pay | Admitting: Cardiovascular Disease

## 2023-12-17 ENCOUNTER — Ambulatory Visit (HOSPITAL_COMMUNITY)
Admission: RE | Admit: 2023-12-17 | Discharge: 2023-12-17 | Disposition: A | Discharge status: Home / Self Care | Source: Ambulatory Visit | Attending: Cardiovascular Disease | Admitting: Cardiovascular Disease

## 2023-12-17 DIAGNOSIS — R5383 Other fatigue: Secondary | ICD-10-CM

## 2023-12-17 DIAGNOSIS — R072 Precordial pain: Secondary | ICD-10-CM | POA: Diagnosis present

## 2023-12-17 DIAGNOSIS — R0609 Other forms of dyspnea: Secondary | ICD-10-CM | POA: Diagnosis not present

## 2023-12-17 LAB — ECHOCARDIOGRAM COMPLETE
AR max vel: 2.32 cm2
AV Area VTI: 2.25 cm2
AV Area mean vel: 1.63 cm2
AV Mean grad: 2 mmHg
AV Peak grad: 5.3 mmHg
Ao pk vel: 1.15 m/s
Area-P 1/2: 4.41 cm2
S' Lateral: 2.81 cm

## 2024-02-29 ENCOUNTER — Encounter (HOSPITAL_COMMUNITY): Payer: Self-pay | Admitting: Family Medicine
